# Patient Record
Sex: Male | Born: 1970
Health system: Southern US, Community
[De-identification: ages and names within clinical notes are randomized; demographics above are authoritative.]

## PROBLEM LIST (undated history)

## (undated) DIAGNOSIS — K7030 Alcoholic cirrhosis of liver without ascites: Secondary | ICD-10-CM

## (undated) DIAGNOSIS — E039 Hypothyroidism, unspecified: Secondary | ICD-10-CM

## (undated) DIAGNOSIS — I10 Essential (primary) hypertension: Secondary | ICD-10-CM

## (undated) DIAGNOSIS — K219 Gastro-esophageal reflux disease without esophagitis: Secondary | ICD-10-CM

## (undated) DIAGNOSIS — E785 Hyperlipidemia, unspecified: Secondary | ICD-10-CM

## (undated) DIAGNOSIS — D649 Anemia, unspecified: Secondary | ICD-10-CM

## (undated) DIAGNOSIS — Z5189 Encounter for other specified aftercare: Secondary | ICD-10-CM

---

## 2016-07-08 DIAGNOSIS — S0501XA Injury of conjunctiva and corneal abrasion without foreign body, right eye, initial encounter: Secondary | ICD-10-CM | POA: Diagnosis not present

## 2016-07-22 DIAGNOSIS — Z6830 Body mass index (BMI) 30.0-30.9, adult: Secondary | ICD-10-CM | POA: Diagnosis not present

## 2016-07-22 DIAGNOSIS — E785 Hyperlipidemia, unspecified: Secondary | ICD-10-CM | POA: Diagnosis not present

## 2016-07-22 DIAGNOSIS — I10 Essential (primary) hypertension: Secondary | ICD-10-CM | POA: Diagnosis not present

## 2016-07-22 DIAGNOSIS — E039 Hypothyroidism, unspecified: Secondary | ICD-10-CM | POA: Diagnosis not present

## 2017-04-18 DIAGNOSIS — M9902 Segmental and somatic dysfunction of thoracic region: Secondary | ICD-10-CM | POA: Diagnosis not present

## 2017-04-18 DIAGNOSIS — M9903 Segmental and somatic dysfunction of lumbar region: Secondary | ICD-10-CM | POA: Diagnosis not present

## 2017-04-18 DIAGNOSIS — M545 Low back pain: Secondary | ICD-10-CM | POA: Diagnosis not present

## 2017-04-18 DIAGNOSIS — M9905 Segmental and somatic dysfunction of pelvic region: Secondary | ICD-10-CM | POA: Diagnosis not present

## 2017-04-20 DIAGNOSIS — M9902 Segmental and somatic dysfunction of thoracic region: Secondary | ICD-10-CM | POA: Diagnosis not present

## 2017-04-20 DIAGNOSIS — M9903 Segmental and somatic dysfunction of lumbar region: Secondary | ICD-10-CM | POA: Diagnosis not present

## 2017-04-20 DIAGNOSIS — M9905 Segmental and somatic dysfunction of pelvic region: Secondary | ICD-10-CM | POA: Diagnosis not present

## 2017-04-20 DIAGNOSIS — M545 Low back pain: Secondary | ICD-10-CM | POA: Diagnosis not present

## 2017-04-22 DIAGNOSIS — M545 Low back pain: Secondary | ICD-10-CM | POA: Diagnosis not present

## 2017-04-22 DIAGNOSIS — M9905 Segmental and somatic dysfunction of pelvic region: Secondary | ICD-10-CM | POA: Diagnosis not present

## 2017-04-22 DIAGNOSIS — M9902 Segmental and somatic dysfunction of thoracic region: Secondary | ICD-10-CM | POA: Diagnosis not present

## 2017-04-22 DIAGNOSIS — M9903 Segmental and somatic dysfunction of lumbar region: Secondary | ICD-10-CM | POA: Diagnosis not present

## 2017-05-23 DIAGNOSIS — J439 Emphysema, unspecified: Secondary | ICD-10-CM | POA: Diagnosis not present

## 2017-05-31 DIAGNOSIS — M9905 Segmental and somatic dysfunction of pelvic region: Secondary | ICD-10-CM | POA: Diagnosis not present

## 2017-05-31 DIAGNOSIS — M9903 Segmental and somatic dysfunction of lumbar region: Secondary | ICD-10-CM | POA: Diagnosis not present

## 2017-05-31 DIAGNOSIS — M545 Low back pain: Secondary | ICD-10-CM | POA: Diagnosis not present

## 2017-05-31 DIAGNOSIS — M9902 Segmental and somatic dysfunction of thoracic region: Secondary | ICD-10-CM | POA: Diagnosis not present

## 2017-06-09 DIAGNOSIS — M9905 Segmental and somatic dysfunction of pelvic region: Secondary | ICD-10-CM | POA: Diagnosis not present

## 2017-06-09 DIAGNOSIS — M545 Low back pain: Secondary | ICD-10-CM | POA: Diagnosis not present

## 2017-06-09 DIAGNOSIS — M9902 Segmental and somatic dysfunction of thoracic region: Secondary | ICD-10-CM | POA: Diagnosis not present

## 2017-06-09 DIAGNOSIS — M9903 Segmental and somatic dysfunction of lumbar region: Secondary | ICD-10-CM | POA: Diagnosis not present

## 2017-06-16 DIAGNOSIS — M9902 Segmental and somatic dysfunction of thoracic region: Secondary | ICD-10-CM | POA: Diagnosis not present

## 2017-06-16 DIAGNOSIS — M9903 Segmental and somatic dysfunction of lumbar region: Secondary | ICD-10-CM | POA: Diagnosis not present

## 2017-06-16 DIAGNOSIS — M545 Low back pain: Secondary | ICD-10-CM | POA: Diagnosis not present

## 2017-06-16 DIAGNOSIS — M9905 Segmental and somatic dysfunction of pelvic region: Secondary | ICD-10-CM | POA: Diagnosis not present

## 2017-06-23 DIAGNOSIS — M9905 Segmental and somatic dysfunction of pelvic region: Secondary | ICD-10-CM | POA: Diagnosis not present

## 2017-06-23 DIAGNOSIS — M9903 Segmental and somatic dysfunction of lumbar region: Secondary | ICD-10-CM | POA: Diagnosis not present

## 2017-06-23 DIAGNOSIS — M9902 Segmental and somatic dysfunction of thoracic region: Secondary | ICD-10-CM | POA: Diagnosis not present

## 2017-06-23 DIAGNOSIS — M545 Low back pain: Secondary | ICD-10-CM | POA: Diagnosis not present

## 2017-06-30 DIAGNOSIS — M545 Low back pain: Secondary | ICD-10-CM | POA: Diagnosis not present

## 2017-06-30 DIAGNOSIS — M9905 Segmental and somatic dysfunction of pelvic region: Secondary | ICD-10-CM | POA: Diagnosis not present

## 2017-06-30 DIAGNOSIS — M9902 Segmental and somatic dysfunction of thoracic region: Secondary | ICD-10-CM | POA: Diagnosis not present

## 2017-06-30 DIAGNOSIS — M9903 Segmental and somatic dysfunction of lumbar region: Secondary | ICD-10-CM | POA: Diagnosis not present

## 2017-07-14 DIAGNOSIS — M9903 Segmental and somatic dysfunction of lumbar region: Secondary | ICD-10-CM | POA: Diagnosis not present

## 2017-07-14 DIAGNOSIS — M9902 Segmental and somatic dysfunction of thoracic region: Secondary | ICD-10-CM | POA: Diagnosis not present

## 2017-07-14 DIAGNOSIS — M545 Low back pain: Secondary | ICD-10-CM | POA: Diagnosis not present

## 2017-07-14 DIAGNOSIS — M9905 Segmental and somatic dysfunction of pelvic region: Secondary | ICD-10-CM | POA: Diagnosis not present

## 2017-07-21 DIAGNOSIS — M545 Low back pain: Secondary | ICD-10-CM | POA: Diagnosis not present

## 2017-07-21 DIAGNOSIS — M9902 Segmental and somatic dysfunction of thoracic region: Secondary | ICD-10-CM | POA: Diagnosis not present

## 2017-07-21 DIAGNOSIS — M9905 Segmental and somatic dysfunction of pelvic region: Secondary | ICD-10-CM | POA: Diagnosis not present

## 2017-07-21 DIAGNOSIS — M9903 Segmental and somatic dysfunction of lumbar region: Secondary | ICD-10-CM | POA: Diagnosis not present

## 2017-11-29 DIAGNOSIS — E039 Hypothyroidism, unspecified: Secondary | ICD-10-CM | POA: Diagnosis not present

## 2017-11-29 DIAGNOSIS — E785 Hyperlipidemia, unspecified: Secondary | ICD-10-CM | POA: Diagnosis not present

## 2017-11-29 DIAGNOSIS — I10 Essential (primary) hypertension: Secondary | ICD-10-CM | POA: Diagnosis not present

## 2017-11-29 DIAGNOSIS — K219 Gastro-esophageal reflux disease without esophagitis: Secondary | ICD-10-CM | POA: Diagnosis not present

## 2017-11-29 DIAGNOSIS — Z1331 Encounter for screening for depression: Secondary | ICD-10-CM | POA: Diagnosis not present

## 2017-12-01 DIAGNOSIS — M87051 Idiopathic aseptic necrosis of right femur: Secondary | ICD-10-CM | POA: Diagnosis not present

## 2017-12-01 DIAGNOSIS — M545 Low back pain: Secondary | ICD-10-CM | POA: Diagnosis not present

## 2017-12-01 DIAGNOSIS — M25551 Pain in right hip: Secondary | ICD-10-CM | POA: Diagnosis not present

## 2018-01-17 DIAGNOSIS — R740 Nonspecific elevation of levels of transaminase and lactic acid dehydrogenase [LDH]: Secondary | ICD-10-CM | POA: Diagnosis not present

## 2018-01-17 DIAGNOSIS — E785 Hyperlipidemia, unspecified: Secondary | ICD-10-CM | POA: Diagnosis not present

## 2018-01-17 DIAGNOSIS — Z01818 Encounter for other preprocedural examination: Secondary | ICD-10-CM | POA: Diagnosis not present

## 2018-01-17 DIAGNOSIS — I10 Essential (primary) hypertension: Secondary | ICD-10-CM | POA: Diagnosis not present

## 2018-01-17 DIAGNOSIS — M25551 Pain in right hip: Secondary | ICD-10-CM | POA: Diagnosis not present

## 2018-01-25 DIAGNOSIS — M1611 Unilateral primary osteoarthritis, right hip: Secondary | ICD-10-CM | POA: Diagnosis not present

## 2018-01-30 DIAGNOSIS — Z96651 Presence of right artificial knee joint: Secondary | ICD-10-CM | POA: Diagnosis not present

## 2018-01-30 DIAGNOSIS — M87051 Idiopathic aseptic necrosis of right femur: Secondary | ICD-10-CM | POA: Diagnosis not present

## 2018-01-30 DIAGNOSIS — M1611 Unilateral primary osteoarthritis, right hip: Secondary | ICD-10-CM | POA: Diagnosis not present

## 2018-03-16 DIAGNOSIS — Z471 Aftercare following joint replacement surgery: Secondary | ICD-10-CM | POA: Diagnosis not present

## 2018-03-16 DIAGNOSIS — Z96641 Presence of right artificial hip joint: Secondary | ICD-10-CM | POA: Diagnosis not present

## 2018-05-03 DIAGNOSIS — M25552 Pain in left hip: Secondary | ICD-10-CM | POA: Diagnosis not present

## 2018-05-03 DIAGNOSIS — M87052 Idiopathic aseptic necrosis of left femur: Secondary | ICD-10-CM | POA: Diagnosis not present

## 2018-05-03 DIAGNOSIS — M25551 Pain in right hip: Secondary | ICD-10-CM | POA: Diagnosis not present

## 2018-06-15 DIAGNOSIS — M25552 Pain in left hip: Secondary | ICD-10-CM | POA: Diagnosis not present

## 2018-06-29 DIAGNOSIS — M25552 Pain in left hip: Secondary | ICD-10-CM | POA: Diagnosis not present

## 2018-07-03 DIAGNOSIS — M1612 Unilateral primary osteoarthritis, left hip: Secondary | ICD-10-CM | POA: Diagnosis not present

## 2018-07-03 DIAGNOSIS — M87352 Other secondary osteonecrosis, left femur: Secondary | ICD-10-CM | POA: Diagnosis not present

## 2019-08-16 DIAGNOSIS — E039 Hypothyroidism, unspecified: Secondary | ICD-10-CM | POA: Diagnosis not present

## 2019-08-16 DIAGNOSIS — Z1331 Encounter for screening for depression: Secondary | ICD-10-CM | POA: Diagnosis not present

## 2019-08-16 DIAGNOSIS — I10 Essential (primary) hypertension: Secondary | ICD-10-CM | POA: Diagnosis not present

## 2019-08-16 DIAGNOSIS — K219 Gastro-esophageal reflux disease without esophagitis: Secondary | ICD-10-CM | POA: Diagnosis not present

## 2019-08-16 DIAGNOSIS — E785 Hyperlipidemia, unspecified: Secondary | ICD-10-CM | POA: Diagnosis not present

## 2019-08-21 DIAGNOSIS — R7401 Elevation of levels of liver transaminase levels: Secondary | ICD-10-CM | POA: Diagnosis not present

## 2019-09-06 DIAGNOSIS — R7401 Elevation of levels of liver transaminase levels: Secondary | ICD-10-CM | POA: Diagnosis not present

## 2020-03-20 DIAGNOSIS — K219 Gastro-esophageal reflux disease without esophagitis: Secondary | ICD-10-CM | POA: Diagnosis not present

## 2020-03-20 DIAGNOSIS — E785 Hyperlipidemia, unspecified: Secondary | ICD-10-CM | POA: Diagnosis not present

## 2020-03-20 DIAGNOSIS — E039 Hypothyroidism, unspecified: Secondary | ICD-10-CM | POA: Diagnosis not present

## 2020-03-20 DIAGNOSIS — I10 Essential (primary) hypertension: Secondary | ICD-10-CM | POA: Diagnosis not present

## 2020-05-14 DIAGNOSIS — I8510 Secondary esophageal varices without bleeding: Secondary | ICD-10-CM | POA: Diagnosis not present

## 2020-05-14 DIAGNOSIS — K920 Hematemesis: Secondary | ICD-10-CM | POA: Diagnosis not present

## 2020-05-14 DIAGNOSIS — K7031 Alcoholic cirrhosis of liver with ascites: Secondary | ICD-10-CM | POA: Diagnosis not present

## 2020-05-14 DIAGNOSIS — E876 Hypokalemia: Secondary | ICD-10-CM | POA: Diagnosis not present

## 2020-05-14 DIAGNOSIS — K56609 Unspecified intestinal obstruction, unspecified as to partial versus complete obstruction: Secondary | ICD-10-CM | POA: Diagnosis not present

## 2020-05-14 DIAGNOSIS — R112 Nausea with vomiting, unspecified: Secondary | ICD-10-CM | POA: Diagnosis not present

## 2020-05-14 DIAGNOSIS — E860 Dehydration: Secondary | ICD-10-CM | POA: Diagnosis not present

## 2020-05-14 DIAGNOSIS — I700 Atherosclerosis of aorta: Secondary | ICD-10-CM | POA: Diagnosis not present

## 2020-05-14 DIAGNOSIS — R1110 Vomiting, unspecified: Secondary | ICD-10-CM | POA: Diagnosis not present

## 2020-05-14 DIAGNOSIS — K7460 Unspecified cirrhosis of liver: Secondary | ICD-10-CM | POA: Diagnosis not present

## 2020-05-14 DIAGNOSIS — R110 Nausea: Secondary | ICD-10-CM | POA: Diagnosis not present

## 2020-05-15 ENCOUNTER — Telehealth (HOSPITAL_COMMUNITY): Payer: Self-pay | Admitting: *Deleted

## 2020-05-15 ENCOUNTER — Inpatient Hospital Stay (HOSPITAL_COMMUNITY): Payer: BC Managed Care – PPO | Admitting: Anesthesiology

## 2020-05-15 ENCOUNTER — Inpatient Hospital Stay (HOSPITAL_COMMUNITY): Payer: BC Managed Care – PPO

## 2020-05-15 ENCOUNTER — Inpatient Hospital Stay (HOSPITAL_COMMUNITY)
Admission: AD | Admit: 2020-05-15 | Discharge: 2020-05-26 | DRG: 405 | Disposition: A | Discharge status: Rehabilitation Facility | Payer: BC Managed Care – PPO | Source: Other Acute Inpatient Hospital | Attending: Internal Medicine | Admitting: Internal Medicine

## 2020-05-15 ENCOUNTER — Inpatient Hospital Stay
Admission: AD | Admit: 2020-05-15 | Payer: Self-pay | Source: Other Acute Inpatient Hospital | Admitting: Pulmonary Disease

## 2020-05-15 ENCOUNTER — Encounter (HOSPITAL_COMMUNITY)
Admission: AD | Disposition: A | Discharge status: Rehabilitation Facility | Payer: Self-pay | Source: Other Acute Inpatient Hospital | Attending: Internal Medicine

## 2020-05-15 DIAGNOSIS — Z20822 Contact with and (suspected) exposure to covid-19: Secondary | ICD-10-CM | POA: Diagnosis present

## 2020-05-15 DIAGNOSIS — R5381 Other malaise: Secondary | ICD-10-CM | POA: Diagnosis present

## 2020-05-15 DIAGNOSIS — K920 Hematemesis: Secondary | ICD-10-CM | POA: Diagnosis not present

## 2020-05-15 DIAGNOSIS — Z720 Tobacco use: Secondary | ICD-10-CM | POA: Diagnosis not present

## 2020-05-15 DIAGNOSIS — K766 Portal hypertension: Secondary | ICD-10-CM | POA: Diagnosis present

## 2020-05-15 DIAGNOSIS — Z978 Presence of other specified devices: Secondary | ICD-10-CM

## 2020-05-15 DIAGNOSIS — E876 Hypokalemia: Secondary | ICD-10-CM | POA: Diagnosis present

## 2020-05-15 DIAGNOSIS — I959 Hypotension, unspecified: Secondary | ICD-10-CM | POA: Diagnosis not present

## 2020-05-15 DIAGNOSIS — K439 Ventral hernia without obstruction or gangrene: Secondary | ICD-10-CM | POA: Diagnosis present

## 2020-05-15 DIAGNOSIS — D689 Coagulation defect, unspecified: Secondary | ICD-10-CM | POA: Diagnosis present

## 2020-05-15 DIAGNOSIS — G9341 Metabolic encephalopathy: Secondary | ICD-10-CM | POA: Diagnosis not present

## 2020-05-15 DIAGNOSIS — N170 Acute kidney failure with tubular necrosis: Secondary | ICD-10-CM | POA: Diagnosis not present

## 2020-05-15 DIAGNOSIS — K922 Gastrointestinal hemorrhage, unspecified: Secondary | ICD-10-CM | POA: Diagnosis not present

## 2020-05-15 DIAGNOSIS — I8500 Esophageal varices without bleeding: Secondary | ICD-10-CM | POA: Diagnosis not present

## 2020-05-15 DIAGNOSIS — R4189 Other symptoms and signs involving cognitive functions and awareness: Secondary | ICD-10-CM | POA: Diagnosis not present

## 2020-05-15 DIAGNOSIS — R339 Retention of urine, unspecified: Secondary | ICD-10-CM | POA: Diagnosis not present

## 2020-05-15 DIAGNOSIS — Z9181 History of falling: Secondary | ICD-10-CM | POA: Diagnosis not present

## 2020-05-15 DIAGNOSIS — K7689 Other specified diseases of liver: Secondary | ICD-10-CM | POA: Diagnosis not present

## 2020-05-15 DIAGNOSIS — Z4682 Encounter for fitting and adjustment of non-vascular catheter: Secondary | ICD-10-CM | POA: Diagnosis not present

## 2020-05-15 DIAGNOSIS — K56609 Unspecified intestinal obstruction, unspecified as to partial versus complete obstruction: Secondary | ICD-10-CM

## 2020-05-15 DIAGNOSIS — K7010 Alcoholic hepatitis without ascites: Secondary | ICD-10-CM | POA: Diagnosis present

## 2020-05-15 DIAGNOSIS — K7201 Acute and subacute hepatic failure with coma: Secondary | ICD-10-CM | POA: Diagnosis not present

## 2020-05-15 DIAGNOSIS — Z9689 Presence of other specified functional implants: Secondary | ICD-10-CM | POA: Diagnosis not present

## 2020-05-15 DIAGNOSIS — Z452 Encounter for adjustment and management of vascular access device: Secondary | ICD-10-CM | POA: Diagnosis not present

## 2020-05-15 DIAGNOSIS — J9811 Atelectasis: Secondary | ICD-10-CM | POA: Diagnosis not present

## 2020-05-15 DIAGNOSIS — K7040 Alcoholic hepatic failure without coma: Secondary | ICD-10-CM | POA: Diagnosis present

## 2020-05-15 DIAGNOSIS — K6389 Other specified diseases of intestine: Secondary | ICD-10-CM | POA: Diagnosis not present

## 2020-05-15 DIAGNOSIS — D62 Acute posthemorrhagic anemia: Secondary | ICD-10-CM | POA: Diagnosis present

## 2020-05-15 DIAGNOSIS — K7031 Alcoholic cirrhosis of liver with ascites: Secondary | ICD-10-CM | POA: Diagnosis not present

## 2020-05-15 DIAGNOSIS — F17220 Nicotine dependence, chewing tobacco, uncomplicated: Secondary | ICD-10-CM | POA: Diagnosis present

## 2020-05-15 DIAGNOSIS — Z9889 Other specified postprocedural states: Secondary | ICD-10-CM | POA: Diagnosis not present

## 2020-05-15 DIAGNOSIS — I8511 Secondary esophageal varices with bleeding: Secondary | ICD-10-CM | POA: Diagnosis not present

## 2020-05-15 DIAGNOSIS — D72829 Elevated white blood cell count, unspecified: Secondary | ICD-10-CM | POA: Diagnosis not present

## 2020-05-15 DIAGNOSIS — K3189 Other diseases of stomach and duodenum: Secondary | ICD-10-CM

## 2020-05-15 DIAGNOSIS — N179 Acute kidney failure, unspecified: Secondary | ICD-10-CM | POA: Diagnosis present

## 2020-05-15 DIAGNOSIS — R578 Other shock: Secondary | ICD-10-CM | POA: Diagnosis present

## 2020-05-15 DIAGNOSIS — I10 Essential (primary) hypertension: Secondary | ICD-10-CM | POA: Diagnosis not present

## 2020-05-15 DIAGNOSIS — R140 Abdominal distension (gaseous): Secondary | ICD-10-CM

## 2020-05-15 DIAGNOSIS — Z8249 Family history of ischemic heart disease and other diseases of the circulatory system: Secondary | ICD-10-CM

## 2020-05-15 DIAGNOSIS — I8510 Secondary esophageal varices without bleeding: Secondary | ICD-10-CM | POA: Diagnosis not present

## 2020-05-15 DIAGNOSIS — J9601 Acute respiratory failure with hypoxia: Secondary | ICD-10-CM | POA: Diagnosis present

## 2020-05-15 DIAGNOSIS — Z79890 Hormone replacement therapy (postmenopausal): Secondary | ICD-10-CM | POA: Diagnosis not present

## 2020-05-15 DIAGNOSIS — R188 Other ascites: Secondary | ICD-10-CM

## 2020-05-15 DIAGNOSIS — E872 Acidosis: Secondary | ICD-10-CM | POA: Diagnosis present

## 2020-05-15 DIAGNOSIS — K7030 Alcoholic cirrhosis of liver without ascites: Principal | ICD-10-CM | POA: Diagnosis present

## 2020-05-15 DIAGNOSIS — F1010 Alcohol abuse, uncomplicated: Secondary | ICD-10-CM | POA: Diagnosis present

## 2020-05-15 DIAGNOSIS — R451 Restlessness and agitation: Secondary | ICD-10-CM | POA: Diagnosis not present

## 2020-05-15 DIAGNOSIS — Z23 Encounter for immunization: Secondary | ICD-10-CM | POA: Diagnosis not present

## 2020-05-15 DIAGNOSIS — K420 Umbilical hernia with obstruction, without gangrene: Secondary | ICD-10-CM | POA: Diagnosis present

## 2020-05-15 DIAGNOSIS — K7460 Unspecified cirrhosis of liver: Secondary | ICD-10-CM | POA: Diagnosis not present

## 2020-05-15 DIAGNOSIS — K56699 Other intestinal obstruction unspecified as to partial versus complete obstruction: Secondary | ICD-10-CM | POA: Diagnosis not present

## 2020-05-15 DIAGNOSIS — K7290 Hepatic failure, unspecified without coma: Secondary | ICD-10-CM | POA: Diagnosis not present

## 2020-05-15 DIAGNOSIS — Z95828 Presence of other vascular implants and grafts: Secondary | ICD-10-CM | POA: Diagnosis not present

## 2020-05-15 DIAGNOSIS — E039 Hypothyroidism, unspecified: Secondary | ICD-10-CM | POA: Diagnosis not present

## 2020-05-15 DIAGNOSIS — R600 Localized edema: Secondary | ICD-10-CM | POA: Diagnosis present

## 2020-05-15 DIAGNOSIS — R21 Rash and other nonspecific skin eruption: Secondary | ICD-10-CM | POA: Diagnosis not present

## 2020-05-15 DIAGNOSIS — Z79899 Other long term (current) drug therapy: Secondary | ICD-10-CM | POA: Diagnosis not present

## 2020-05-15 DIAGNOSIS — I8501 Esophageal varices with bleeding: Secondary | ICD-10-CM | POA: Diagnosis not present

## 2020-05-15 HISTORY — PX: RADIOLOGY WITH ANESTHESIA: SHX6223

## 2020-05-15 HISTORY — DX: Essential (primary) hypertension: I10

## 2020-05-15 HISTORY — DX: Hypothyroidism, unspecified: E03.9

## 2020-05-15 LAB — PREPARE RBC (CROSSMATCH)

## 2020-05-15 LAB — ABO/RH: ABO/RH(D): O POS

## 2020-05-15 SURGERY — IR WITH ANESTHESIA
Anesthesia: General

## 2020-05-15 MED ORDER — FENTANYL BOLUS VIA INFUSION
50.0000 ug | INTRAVENOUS | Status: DC | PRN
  Administered 2020-05-15: 100 ug via INTRAVENOUS
  Administered 2020-05-15: 50 ug via INTRAVENOUS

## 2020-05-15 MED ORDER — NOREPINEPHRINE 4 MG/250ML-% IV SOLN
2.0000 ug/min | INTRAVENOUS | Status: DC
  Administered 2020-05-15: 10 ug/min via INTRAVENOUS

## 2020-05-15 MED ORDER — ROCURONIUM BROMIDE 100 MG/10ML IV SOLN: INTRAVENOUS | Status: DC | PRN

## 2020-05-15 MED ORDER — CALCIUM CHLORIDE 10 % IV SOLN
INTRAVENOUS | Status: AC
  Filled 2020-05-15: qty 10

## 2020-05-15 MED ORDER — PROPOFOL 10 MG/ML IV BOLUS
INTRAVENOUS | Status: DC | PRN
  Administered 2020-05-15: 40 mg via INTRAVENOUS

## 2020-05-15 MED ORDER — IOHEXOL 300 MG/ML  SOLN: 150.0000 mL | Freq: Once | INTRAMUSCULAR | Status: DC | PRN

## 2020-05-15 MED ORDER — NOREPINEPHRINE 4 MG/250ML-% IV SOLN: 0.0000 ug/min | INTRAVENOUS | Status: DC

## 2020-05-15 MED ORDER — FENTANYL 2500MCG IN NS 250ML (10MCG/ML) PREMIX INFUSION
50.0000 ug/h | INTRAVENOUS | Status: DC
  Administered 2020-05-15: 400 ug/h via INTRAVENOUS

## 2020-05-15 MED ORDER — FENTANYL 2500MCG IN NS 250ML (10MCG/ML) PREMIX INFUSION
50.0000 ug/h | INTRAVENOUS | Status: DC
  Administered 2020-05-15: 150 ug/h via INTRAVENOUS
  Administered 2020-05-16: 200 ug/h via INTRAVENOUS
  Administered 2020-05-17 – 2020-05-18 (×2): 150 ug/h via INTRAVENOUS
  Administered 2020-05-19: 200 ug/h via INTRAVENOUS
  Filled 2020-05-15 (×6): qty 250

## 2020-05-15 MED ORDER — FENTANYL CITRATE (PF) 100 MCG/2ML IJ SOLN: 100.0000 ug | Freq: Once | INTRAMUSCULAR | Status: DC

## 2020-05-15 MED ORDER — MIDAZOLAM HCL 2 MG/2ML IJ SOLN: 2.0000 mg | INTRAMUSCULAR | Status: DC | PRN

## 2020-05-15 MED ORDER — SODIUM CHLORIDE 0.9 % IV SOLN: 1.0000 mg | Freq: Once | INTRAVENOUS | Status: DC

## 2020-05-15 MED ORDER — SODIUM CHLORIDE 0.9 % IV SOLN
INTRAVENOUS | Status: DC | PRN
  Administered 2020-05-15: 2 mg/h via INTRAVENOUS

## 2020-05-15 MED ORDER — PANTOPRAZOLE SODIUM 40 MG IV SOLR: 40.0000 mg | Freq: Two times a day (BID) | INTRAVENOUS | Status: DC

## 2020-05-15 MED ORDER — DOCUSATE SODIUM 100 MG PO CAPS: 100.0000 mg | ORAL_CAPSULE | Freq: Two times a day (BID) | ORAL | Status: DC | PRN

## 2020-05-15 MED ORDER — IOHEXOL 300 MG/ML  SOLN: 100.0000 mL | Freq: Once | INTRAMUSCULAR | Status: DC | PRN

## 2020-05-15 MED ORDER — SODIUM CHLORIDE 0.9 % IV SOLN
8.0000 mg/h | INTRAVENOUS | Status: DC
  Administered 2020-05-16 – 2020-05-18 (×3): 8 mg/h via INTRAVENOUS
  Filled 2020-05-15 (×4): qty 80

## 2020-05-15 MED ORDER — DOCUSATE SODIUM 50 MG/5ML PO LIQD: 100.0000 mg | Freq: Two times a day (BID) | ORAL | Status: DC

## 2020-05-15 MED ORDER — ONDANSETRON HCL 4 MG/2ML IJ SOLN: 4.0000 mg | Freq: Four times a day (QID) | INTRAMUSCULAR | Status: DC | PRN

## 2020-05-15 MED ORDER — FOLIC ACID 5 MG/ML IJ SOLN
1.0000 mg | Freq: Once | INTRAMUSCULAR | Status: AC
  Administered 2020-05-16: 1 mg via INTRAVENOUS
  Filled 2020-05-15: qty 0.2

## 2020-05-15 MED ORDER — ROCURONIUM BROMIDE 10 MG/ML (PF) SYRINGE
PREFILLED_SYRINGE | INTRAVENOUS | Status: DC | PRN
  Administered 2020-05-15 (×5): 50 mg via INTRAVENOUS

## 2020-05-15 MED ORDER — POLYETHYLENE GLYCOL 3350 17 G PO PACK: 17.0000 g | PACK | Freq: Every day | ORAL | Status: DC | PRN

## 2020-05-15 MED ORDER — GELATIN ABSORBABLE 12-7 MM EX MISC
CUTANEOUS | Status: AC
  Filled 2020-05-15: qty 1

## 2020-05-15 MED ORDER — SODIUM CHLORIDE 0.9 % IV SOLN
250.0000 mL | INTRAVENOUS | Status: DC
  Administered 2020-05-16: 250 mL via INTRAVENOUS

## 2020-05-15 MED ORDER — CALCIUM CHLORIDE 10 % IV SOLN
INTRAVENOUS | Status: DC | PRN
  Administered 2020-05-15: 300 mg via INTRAVENOUS
  Administered 2020-05-15 (×2): 200 mg via INTRAVENOUS
  Administered 2020-05-15: 300 mg via INTRAVENOUS

## 2020-05-15 MED ORDER — FENTANYL CITRATE (PF) 100 MCG/2ML IJ SOLN: 50.0000 ug | Freq: Once | INTRAMUSCULAR | Status: DC

## 2020-05-15 MED ORDER — ALBUMIN HUMAN 5 % IV SOLN: INTRAVENOUS | Status: DC | PRN

## 2020-05-15 MED ORDER — SODIUM CHLORIDE 0.9 % IV SOLN
250.0000 mL | INTRAVENOUS | Status: DC
  Administered 2020-05-17 – 2020-05-20 (×2): 250 mL via INTRAVENOUS

## 2020-05-15 MED ORDER — SODIUM CHLORIDE 0.9 % IV SOLN
50.0000 ug/h | INTRAVENOUS | Status: DC
  Administered 2020-05-15 – 2020-05-18 (×5): 50 ug/h via INTRAVENOUS
  Filled 2020-05-15 (×6): qty 1

## 2020-05-15 MED ORDER — LACTATED RINGERS IV SOLN: INTRAVENOUS | Status: DC

## 2020-05-15 MED ORDER — DEXMEDETOMIDINE HCL IN NACL 400 MCG/100ML IV SOLN: 0.4000 ug/kg/h | INTRAVENOUS | Status: DC

## 2020-05-15 MED ORDER — POLYETHYLENE GLYCOL 3350 17 G PO PACK: 17.0000 g | PACK | Freq: Every day | ORAL | Status: DC

## 2020-05-15 MED ORDER — THIAMINE HCL 100 MG/ML IJ SOLN
100.0000 mg | Freq: Every day | INTRAMUSCULAR | Status: DC
  Administered 2020-05-16 – 2020-05-20 (×5): 100 mg via INTRAVENOUS
  Filled 2020-05-15 (×5): qty 2

## 2020-05-15 MED ORDER — PIPERACILLIN-TAZOBACTAM 3.375 G IVPB
3.3750 g | Freq: Three times a day (TID) | INTRAVENOUS | Status: DC
  Administered 2020-05-15 – 2020-05-18 (×9): 3.375 g via INTRAVENOUS
  Filled 2020-05-15 (×9): qty 50

## 2020-05-15 MED ORDER — CEFAZOLIN SODIUM-DEXTROSE 2-4 GM/100ML-% IV SOLN
INTRAVENOUS | Status: AC
  Filled 2020-05-15: qty 100

## 2020-05-15 MED ORDER — SODIUM CHLORIDE 0.9 % IV SOLN
80.0000 mg | Freq: Once | INTRAVENOUS | Status: AC
  Administered 2020-05-16: 80 mg via INTRAVENOUS
  Filled 2020-05-15: qty 80

## 2020-05-15 MED ORDER — FENTANYL BOLUS VIA INFUSION
50.0000 ug | INTRAVENOUS | Status: DC | PRN
  Filled 2020-05-15: qty 50

## 2020-05-15 MED ORDER — CEFAZOLIN SODIUM-DEXTROSE 2-3 GM-%(50ML) IV SOLR
INTRAVENOUS | Status: DC | PRN
  Administered 2020-05-15: 2 g via INTRAVENOUS

## 2020-05-15 NOTE — Procedures (Signed)
Interventional Radiology Procedure Note  Procedure: TIPS and embolization of varices  Complications: None  Estimated Blood Loss: 10 mL  Findings: Successful placement of TIPS with 10 mm diameter Viatorr prosthesis. Additional coil and occluder device embolization of variceal trunks supplying esophageal varices. Will follow post TIPS.  Venetia Night. Kathlene Cote, M.D Pager:  [phone]

## 2020-05-15 NOTE — Sedation Documentation (Signed)
Patient arrives via Insurance underwriter with [employer]. Anesthesia to assume care and monitoring.

## 2020-05-15 NOTE — Anesthesia Procedure Notes (Signed)
Arterial Line Insertion Start/End8/19/2021 6:05 PM, 05/15/2020 6:15 PM Performed by: Catalina Gravel, MD, anesthesiologist  Patient location: OR. Preanesthetic checklist: patient identified, IV checked, site marked, risks and benefits discussed, surgical consent, monitors and equipment checked, pre-op evaluation, timeout performed and anesthesia consent Lidocaine 1% used for infiltration Left, radial was placed Catheter size: 20 Fr Hand hygiene performed  and maximum sterile barriers used   Attempts: 1 Procedure performed without using ultrasound guided technique. Following insertion, dressing applied and Biopatch. Post procedure assessment: normal and unchanged  Patient tolerated the procedure well with no immediate complications.

## 2020-05-15 NOTE — Progress Notes (Addendum)
Pharmacy Antibiotic Note  Jimmy Gonzales is a 49 y.o. male admitted on 05/15/2020 on transfer from [facility] for emergent TIPS placement after presenting to OSH with hematemesis, upper GI bleed, and hemorrhagic shock due to esophageal variceal bleeding. Pharmacy has been consulted for Zosyn dosing for intraabdominal infection.  Per pharmacist at [facility]: Weight: 102.4 kg Height: 81 inches Scr today 2.2 (up from 1.8 yesterday); Scr ~60 ml/min NKA  Plan: Zosyn 3.375 gm IV Q 8 hrs (extended infusion) Monitor WBC, temp, renal function  CrCl cannot be calculated (No successful lab value found.).    Antimicrobials this admission: Per [facility] pharmacist, pt rec'd ceftriaxone 2 gm IV Q 24 hrs there, starting last evening 8/19 Cefazolin peri op  Microbiology results: N/A  Thank you for allowing pharmacy to be a part of this patient's care.  Gillermina Hu, PharmD, BCPS, [facility] Clinical Pharmacist 05/15/2020 7:04 PM

## 2020-05-15 NOTE — H&P (Signed)
NAME:  Jimmy Gonzales, MRN:  [id number], DOB:  1971-03-09, LOS: 0 ADMISSION DATE:  05/15/2020, CONSULTATION DATE: May 15, 2020 REFERRING MD: Dr. Kathlene Cote, CHIEF COMPLAINT: Vomiting blood  Brief History   49 year old male with a past medical history significant for heavy alcohol abuse presented to [facility] with hematemesis, developed hemorrhagic shock, GI unable to control bleeding with endoscopy.  Transferred to [facility] for emergent TIPS placement.  History of present illness   This is a 49 year old male who has a past medical history significant for drinking 1/2 gallon of hard liquor a day who presented to [facility] with a chief complaint of hematemesis on August 18.  Apparently he had developed abdominal pain and nausea and vomiting which was initially nonbilious nonbloody for 2 days prior to admission.  He was on the way to see his physician on August 18 when he suddenly developed hematemesis.  In the emergency department he was noted to have a large ventral hernia which was incarcerated based on CT scanning.  This was easily reduced and he was admitted to the intensive care unit on an octreotide infusion.  However, hematemesis and hematochezia persisted.  He required intubation, central line placement, vasopressor infusion, multiple blood transfusions, and emergent endoscopy.  Unfortunately endoscopy was incomplete as the patient had significant bleeding.  7 bands were placed but esophageal variceal bleeding was unable to be controlled.  Reportedly Covid negative. He was transferred to [facility] for emergent TIPS. 2 units PRBC given during the procedure. PCCM accepted the patient for ICU admission.   Past Medical History  Alcohol abuse  Significant Hospital Events   August 18 admitted [facility] August 19 emergent endoscopy, banding of variceal lesions in esophagus, transferred to [facility] for emergent TIPS procedure  Consults:    Interventional radiology  Procedures:  August 19 endotracheal tube August 19 right IJ central venous line  Significant Diagnostic Tests:  August 18 CT abdomen > high grade SBO with transition at small umbilical hernia containing a portion on the mid jejunum. Cirrhosis with portal venous hypertension and marked abdominal varices.   Micro Data:  August 19 SARS-CoV-2> negative Hepatitis A IgM ab neg Hepatitis Bs antigen negative Hepatitis B core IGM negative Hepatitis C antibody 0.1   Antimicrobials:  August 19 Zosyn>  Interim history/subjective:  As above  Objective   There were no vitals taken for this visit.        Intake/Output Summary (Last 24 hours) at 05/15/2020 1847 Last data filed at 05/15/2020 1825 Gross per 24 hour  Intake 50 ml  Output --  Net 50 ml   There were no vitals filed for this visit.  Examination: General: middle aged male in no distress on vent HENT: Normocephalic. Dried blood on mouth and chin. Scleral icterus.  Lungs: Clear Cardiovascular: Tachy, regular, no MRG Abdomen: Soft, non-tender. Umbilical hernia reduces easily. Dark red/black OG drainage.  Extremities: No acute deformity Neuro: [facility] Problem list     Assessment & Plan:  Acute upper GI Bleed due to esophageal variceal bleeding - Now s/p emergent TIPS 8/19 - Octreotide infusion - PPI infusion, then protonix 40mg  BID - Zosyn r/o intraabdominal infection.   Decompensated alcoholic cirrhosis with portal hypertension: Acute alcoholic hepatitis.  - Minimize narcotics - Hepatic dosing of medications  Acute respiratory failure with hypoxemia due to inability to protect airway: - Full mechanical vent support - VAP prevention - Daily WUA/SBT  Hemorrhagic shock: improved post TIPS,  now off pressors.  - Monitor hemoglobin, goal greater than 8 g/dL - Repeat CBC now and every 6 hours - Check lactic acid  -Monitor coags  Acute kidney injury: Most likely  prerenal given hemorrhagic shock, at risk of hepatorenal syndrome - Repeat basic metabolic panel now - Monitor urine output  Incarcerated bowel, small bowel obstruction, reduced at [facility] - OGT to LIS - KUB to re-evaluate - serial physical exam  Need for sedation for mechanical ventilation Heavy alcohol abuse, at risk for alcohol withdrawal At risk for hepatic encephalopathy  Precedex infusion per PAD protocol, RASS goal -1 Fentanyl infusion Versed drip wean to off.  Thiamine, folate Check ammonia, may need to start lactulose despite liquid stools. Risk of hepatic encephalopathy increased after TIPS.    Best practice:  Diet: NPO Pain/Anxiety/Delirium protocol (if indicated): As above VAP protocol (if indicated): Yes DVT prophylaxis: SCD GI prophylaxis: SCD Glucose control: Monitor Mobility: Bedrest Code Status: Full Family Communication: Wife updated at bedside Disposition: ICU  Labs   CBC: No results for input(s): WBC, NEUTROABS, HGB, HCT, MCV, PLT in the last 168 hours.  Basic Metabolic Panel: No results for input(s): NA, K, CL, CO2, GLUCOSE, BUN, CREATININE, CALCIUM, MG, PHOS in the last 168 hours. GFR: CrCl cannot be calculated (No successful lab value found.). No results for input(s): PROCALCITON, WBC, LATICACIDVEN in the last 168 hours.  Liver Function Tests: No results for input(s): AST, ALT, ALKPHOS, BILITOT, PROT, ALBUMIN in the last 168 hours. No results for input(s): LIPASE, AMYLASE in the last 168 hours. No results for input(s): AMMONIA in the last 168 hours.  ABG No results found for: PHART, PCO2ART, PO2ART, HCO3, TCO2, ACIDBASEDEF, O2SAT   Coagulation Profile: No results for input(s): INR, PROTIME in the last 168 hours.  Cardiac Enzymes: No results for input(s): CKTOTAL, CKMB, CKMBINDEX, TROPONINI in the last 168 hours.  HbA1C: No results found for: HGBA1C  CBG: No results for input(s): GLUCAP in the last 168 hours.  Review of  Systems:   Patient is encephalopathic and/or intubated. Therefore history has been obtained from chart review.   Past Medical History  He,  has no past medical history on file.   Surgical History     Social History      Family History   His family history is not on file.   Allergies Not on File   Home Medications  Prior to Admission medications   Not on File     Critical care time: 41 minutes     Georgann Housekeeper, AGACNP-BC [place] for personal pager PCCM on call pager [phone]  05/16/2020 12:16 AM

## 2020-05-15 NOTE — Anesthesia Preprocedure Evaluation (Addendum)
Anesthesia Evaluation  Patient identified by MRN, date of birth, ID band Patient unresponsive    Reviewed: Allergy & Precautions, Patient's Chart, lab work & pertinent test results, Unable to perform ROS - Chart review onlyPreop documentation limited or incomplete due to emergent nature of procedure.  History of Anesthesia Complications Negative for: history of anesthetic complications  Airway Mallampati: Intubated       Dental   Pulmonary neg pulmonary ROS,       + intubated    Cardiovascular negative cardio ROS   Rate:Tachycardia     Neuro/Psych negative neurological ROS  negative psych ROS   GI/Hepatic negative GI ROS, (+) Cirrhosis   Esophageal Varices    ,   Endo/Other  negative endocrine ROS  Renal/GU negative Renal ROS     Musculoskeletal negative musculoskeletal ROS (+)   Abdominal   Peds  Hematology negative hematology ROS (+)   Anesthesia Other Findings   Reproductive/Obstetrics                           Anesthesia Physical Anesthesia Plan  ASA: IV and emergent  Anesthesia Plan: General   Post-op Pain Management:    Induction: Intravenous, Rapid sequence and Cricoid pressure planned  PONV Risk Score and Plan: 1 and Treatment may vary due to age or medical condition, Ondansetron and Dexamethasone  Airway Management Planned: Oral ETT  Additional Equipment: Arterial line, CVP and Ultrasound Guidance Line Placement  Intra-op Plan:   Post-operative Plan: Possible Post-op intubation/ventilation  Informed Consent:     Only emergency history available  Plan Discussed with: CRNA and Anesthesiologist  Anesthesia Plan Comments: (Portions of pre-op completed after start of anesthesia due to emergent nature of procedure.)      Anesthesia Quick Evaluation

## 2020-05-15 NOTE — Transfer of Care (Signed)
Immediate Anesthesia Transfer of Care Note  Patient: Jimmy Gonzales  Procedure(s) Performed: TIPS PROCEDURE (N/A )  Patient Location: ICU  Anesthesia Type:General  Level of Consciousness: Patient remains intubated per anesthesia plan  Airway & Oxygen Therapy: Patient remains intubated per anesthesia plan and Patient placed on Ventilator (see vital sign flow sheet for setting)  Post-op Assessment: Report given to RN and Post -op Vital signs reviewed and stable  Post vital signs: Reviewed and stable  Last Vitals:  Vitals Value Taken Time  BP 128/72   Temp    Pulse 114   Resp 15   SpO2 98 % 05/15/20 2328    Last Pain: There were no vitals filed for this visit.    Report to Chrys Racer RN in [facility] ICU per Letitia Libra CRNA/this CRNA; patient transported via 100% FiO2 via Ambu bag, currently on fentanyl, versed, octreotide gtts and zosyn abx infusing per orders, all medication infusing through central access via right subclavian, Lillian RT at bedside, applied to ventilator, PRVC 620 Vt, RR15 60% FIO2, VSS, questions answered, transfer of patient care in safe and stable condition.    Complications: No complications documented.

## 2020-05-15 NOTE — Progress Notes (Signed)
[facility] Progress Note Patient Name: [name] DOB: [birth year] MRN: [id number]   Date of Service  05/15/2020  HPI/Events of Note  49 yo alcoholic with upper GI bleeding due to esophageal varices, not able to be controlled by banding, who was transferred to [facility] where he underwent TIPS procedure earlier this evening.  eICU Interventions  New Patient Evaluation completed, PCCM ground crew requested to see the patient.        Kerry Kass Luvena Wentling 05/15/2020, 11:44 PM

## 2020-05-16 ENCOUNTER — Encounter (HOSPITAL_COMMUNITY): Payer: Self-pay | Admitting: Pulmonary Disease

## 2020-05-16 ENCOUNTER — Inpatient Hospital Stay (HOSPITAL_COMMUNITY): Payer: BC Managed Care – PPO

## 2020-05-16 DIAGNOSIS — K56609 Unspecified intestinal obstruction, unspecified as to partial versus complete obstruction: Secondary | ICD-10-CM

## 2020-05-16 DIAGNOSIS — J9601 Acute respiratory failure with hypoxia: Secondary | ICD-10-CM

## 2020-05-16 DIAGNOSIS — N179 Acute kidney failure, unspecified: Secondary | ICD-10-CM

## 2020-05-16 DIAGNOSIS — K7460 Unspecified cirrhosis of liver: Secondary | ICD-10-CM

## 2020-05-16 DIAGNOSIS — K922 Gastrointestinal hemorrhage, unspecified: Secondary | ICD-10-CM

## 2020-05-16 DIAGNOSIS — R578 Other shock: Secondary | ICD-10-CM

## 2020-05-16 DIAGNOSIS — K7290 Hepatic failure, unspecified without coma: Secondary | ICD-10-CM

## 2020-05-16 HISTORY — PX: IR TIPS: IMG2295

## 2020-05-16 HISTORY — PX: IR ANGIOGRAM SELECTIVE EACH ADDITIONAL VESSEL: IMG667

## 2020-05-16 HISTORY — PX: IR EMBO ART  VEN HEMORR LYMPH EXTRAV  INC GUIDE ROADMAPPING: IMG5450

## 2020-05-16 LAB — POCT I-STAT 7, (LYTES, BLD GAS, ICA,H+H)
Acid-base deficit: 3 mmol/L — ABNORMAL HIGH (ref 0.0–2.0)
Acid-base deficit: 4 mmol/L — ABNORMAL HIGH (ref 0.0–2.0)
Acid-base deficit: 5 mmol/L — ABNORMAL HIGH (ref 0.0–2.0)
Acid-base deficit: 5 mmol/L — ABNORMAL HIGH (ref 0.0–2.0)
Bicarbonate: 20.2 mmol/L (ref 20.0–28.0)
Bicarbonate: 20.5 mmol/L (ref 20.0–28.0)
Bicarbonate: 22.5 mmol/L (ref 20.0–28.0)
Bicarbonate: 23.3 mmol/L (ref 20.0–28.0)
Calcium, Ion: 0.76 mmol/L — CL (ref 1.15–1.40)
Calcium, Ion: 0.85 mmol/L — CL (ref 1.15–1.40)
Calcium, Ion: 0.89 mmol/L — CL (ref 1.15–1.40)
Calcium, Ion: 0.93 mmol/L — ABNORMAL LOW (ref 1.15–1.40)
HCT: 22 % — ABNORMAL LOW (ref 39.0–52.0)
HCT: 22 % — ABNORMAL LOW (ref 39.0–52.0)
HCT: 26 % — ABNORMAL LOW (ref 39.0–52.0)
HCT: 29 % — ABNORMAL LOW (ref 39.0–52.0)
Hemoglobin: 7.5 g/dL — ABNORMAL LOW (ref 13.0–17.0)
Hemoglobin: 7.5 g/dL — ABNORMAL LOW (ref 13.0–17.0)
Hemoglobin: 8.8 g/dL — ABNORMAL LOW (ref 13.0–17.0)
Hemoglobin: 9.9 g/dL — ABNORMAL LOW (ref 13.0–17.0)
O2 Saturation: 100 %
O2 Saturation: 98 %
O2 Saturation: 99 %
O2 Saturation: 99 %
Patient temperature: 37
Patient temperature: 37.5
Patient temperature: 99
Potassium: 2.8 mmol/L — ABNORMAL LOW (ref 3.5–5.1)
Potassium: 3 mmol/L — ABNORMAL LOW (ref 3.5–5.1)
Potassium: 3.3 mmol/L — ABNORMAL LOW (ref 3.5–5.1)
Potassium: 3.3 mmol/L — ABNORMAL LOW (ref 3.5–5.1)
Sodium: 136 mmol/L (ref 135–145)
Sodium: 138 mmol/L (ref 135–145)
Sodium: 139 mmol/L (ref 135–145)
Sodium: 139 mmol/L (ref 135–145)
TCO2: 21 mmol/L — ABNORMAL LOW (ref 22–32)
TCO2: 22 mmol/L (ref 22–32)
TCO2: 24 mmol/L (ref 22–32)
TCO2: 25 mmol/L (ref 22–32)
pCO2 arterial: 38.8 mmHg (ref 32.0–48.0)
pCO2 arterial: 40 mmHg (ref 32.0–48.0)
pCO2 arterial: 45.1 mmHg (ref 32.0–48.0)
pCO2 arterial: 47.4 mmHg (ref 32.0–48.0)
pH, Arterial: 7.299 — ABNORMAL LOW (ref 7.350–7.450)
pH, Arterial: 7.307 — ABNORMAL LOW (ref 7.350–7.450)
pH, Arterial: 7.319 — ABNORMAL LOW (ref 7.350–7.450)
pH, Arterial: 7.326 — ABNORMAL LOW (ref 7.350–7.450)
pO2, Arterial: 112 mmHg — ABNORMAL HIGH (ref 83.0–108.0)
pO2, Arterial: 139 mmHg — ABNORMAL HIGH (ref 83.0–108.0)
pO2, Arterial: 145 mmHg — ABNORMAL HIGH (ref 83.0–108.0)
pO2, Arterial: 188 mmHg — ABNORMAL HIGH (ref 83.0–108.0)

## 2020-05-16 LAB — CBC
HCT: 23.6 % — ABNORMAL LOW (ref 39.0–52.0)
HCT: 25.7 % — ABNORMAL LOW (ref 39.0–52.0)
HCT: 26.9 % — ABNORMAL LOW (ref 39.0–52.0)
HCT: 27.3 % — ABNORMAL LOW (ref 39.0–52.0)
HCT: 27.4 % — ABNORMAL LOW (ref 39.0–52.0)
Hemoglobin: 7.9 g/dL — ABNORMAL LOW (ref 13.0–17.0)
Hemoglobin: 8.9 g/dL — ABNORMAL LOW (ref 13.0–17.0)
Hemoglobin: 8.9 g/dL — ABNORMAL LOW (ref 13.0–17.0)
Hemoglobin: 9 g/dL — ABNORMAL LOW (ref 13.0–17.0)
Hemoglobin: 9.1 g/dL — ABNORMAL LOW (ref 13.0–17.0)
MCH: 29 pg (ref 26.0–34.0)
MCH: 29.8 pg (ref 26.0–34.0)
MCH: 30 pg (ref 26.0–34.0)
MCH: 30.4 pg (ref 26.0–34.0)
MCH: 30.4 pg (ref 26.0–34.0)
MCHC: 32.6 g/dL (ref 30.0–36.0)
MCHC: 33.2 g/dL (ref 30.0–36.0)
MCHC: 33.5 g/dL (ref 30.0–36.0)
MCHC: 33.5 g/dL (ref 30.0–36.0)
MCHC: 34.6 g/dL (ref 30.0–36.0)
MCV: 87.7 fL (ref 80.0–100.0)
MCV: 88.9 fL (ref 80.0–100.0)
MCV: 89.1 fL (ref 80.0–100.0)
MCV: 89.7 fL (ref 80.0–100.0)
MCV: 91.6 fL (ref 80.0–100.0)
Platelets: 140 10*3/uL — ABNORMAL LOW (ref 150–400)
Platelets: 152 10*3/uL (ref 150–400)
Platelets: 163 10*3/uL (ref 150–400)
Platelets: 177 10*3/uL (ref 150–400)
Platelets: 188 10*3/uL (ref 150–400)
RBC: 2.65 MIL/uL — ABNORMAL LOW (ref 4.22–5.81)
RBC: 2.93 MIL/uL — ABNORMAL LOW (ref 4.22–5.81)
RBC: 2.99 MIL/uL — ABNORMAL LOW (ref 4.22–5.81)
RBC: 3 MIL/uL — ABNORMAL LOW (ref 4.22–5.81)
RBC: 3.07 MIL/uL — ABNORMAL LOW (ref 4.22–5.81)
RDW: 16.2 % — ABNORMAL HIGH (ref 11.5–15.5)
RDW: 16.8 % — ABNORMAL HIGH (ref 11.5–15.5)
RDW: 16.9 % — ABNORMAL HIGH (ref 11.5–15.5)
RDW: 17 % — ABNORMAL HIGH (ref 11.5–15.5)
RDW: 17 % — ABNORMAL HIGH (ref 11.5–15.5)
WBC: 19.2 10*3/uL — ABNORMAL HIGH (ref 4.0–10.5)
WBC: 21.2 10*3/uL — ABNORMAL HIGH (ref 4.0–10.5)
WBC: 23.5 10*3/uL — ABNORMAL HIGH (ref 4.0–10.5)
WBC: 23.8 10*3/uL — ABNORMAL HIGH (ref 4.0–10.5)
WBC: 23.8 10*3/uL — ABNORMAL HIGH (ref 4.0–10.5)
nRBC: 0.5 % — ABNORMAL HIGH (ref 0.0–0.2)
nRBC: 0.7 % — ABNORMAL HIGH (ref 0.0–0.2)
nRBC: 0.7 % — ABNORMAL HIGH (ref 0.0–0.2)
nRBC: 0.7 % — ABNORMAL HIGH (ref 0.0–0.2)
nRBC: 1.2 % — ABNORMAL HIGH (ref 0.0–0.2)

## 2020-05-16 LAB — AMMONIA: Ammonia: 154 umol/L — ABNORMAL HIGH (ref 9–35)

## 2020-05-16 LAB — PROTIME-INR
INR: 1.9 — ABNORMAL HIGH (ref 0.8–1.2)
INR: 2 — ABNORMAL HIGH (ref 0.8–1.2)
INR: 2.5 — ABNORMAL HIGH (ref 0.8–1.2)
Prothrombin Time: 21.5 seconds — ABNORMAL HIGH (ref 11.4–15.2)
Prothrombin Time: 22.2 seconds — ABNORMAL HIGH (ref 11.4–15.2)
Prothrombin Time: 25.9 seconds — ABNORMAL HIGH (ref 11.4–15.2)

## 2020-05-16 LAB — PHOSPHORUS
Phosphorus: 7.7 mg/dL — ABNORMAL HIGH (ref 2.5–4.6)
Phosphorus: 4.9 mg/dL — ABNORMAL HIGH (ref 2.5–4.6)

## 2020-05-16 LAB — HEMOGLOBIN AND HEMATOCRIT, BLOOD
HCT: 26.7 % — ABNORMAL LOW (ref 39.0–52.0)
HCT: 23.4 % — ABNORMAL LOW (ref 39.0–52.0)
Hemoglobin: 8.9 g/dL — ABNORMAL LOW (ref 13.0–17.0)
Hemoglobin: 7.9 g/dL — ABNORMAL LOW (ref 13.0–17.0)

## 2020-05-16 LAB — MAGNESIUM
Magnesium: 1.1 mg/dL — ABNORMAL LOW (ref 1.7–2.4)
Magnesium: 2.1 mg/dL (ref 1.7–2.4)
Magnesium: 1.9 mg/dL (ref 1.7–2.4)
Magnesium: 2.1 mg/dL (ref 1.7–2.4)

## 2020-05-16 LAB — COMPREHENSIVE METABOLIC PANEL
ALT: 32 U/L (ref 0–44)
AST: 109 U/L — ABNORMAL HIGH (ref 15–41)
Albumin: 1.9 g/dL — ABNORMAL LOW (ref 3.5–5.0)
Alkaline Phosphatase: 74 U/L (ref 38–126)
Anion gap: 16 — ABNORMAL HIGH (ref 5–15)
BUN: 40 mg/dL — ABNORMAL HIGH (ref 6–20)
CO2: 19 mmol/L — ABNORMAL LOW (ref 22–32)
Calcium: 6.4 mg/dL — CL (ref 8.9–10.3)
Chloride: 99 mmol/L (ref 98–111)
Creatinine, Ser: 3.81 mg/dL — ABNORMAL HIGH (ref 0.61–1.24)
GFR calc Af Amer: 21 mL/min — ABNORMAL LOW (ref >60)
GFR calc non Af Amer: 18 mL/min — ABNORMAL LOW (ref >60)
Glucose, Bld: 125 mg/dL — ABNORMAL HIGH (ref 70–99)
Potassium: 3.1 mmol/L — ABNORMAL LOW (ref 3.5–5.1)
Sodium: 134 mmol/L — ABNORMAL LOW (ref 135–145)
Total Bilirubin: 4.7 mg/dL — ABNORMAL HIGH (ref 0.3–1.2)
Total Protein: 5.2 g/dL — ABNORMAL LOW (ref 6.5–8.1)

## 2020-05-16 LAB — LACTIC ACID, PLASMA
Lactic Acid, Venous: 5.6 mmol/L (ref 0.5–1.9)
Lactic Acid, Venous: 3 mmol/L (ref 0.5–1.9)

## 2020-05-16 LAB — MRSA PCR SCREENING

## 2020-05-16 LAB — URINALYSIS, ROUTINE W REFLEX MICROSCOPIC
Bilirubin Urine: NEGATIVE
Glucose, UA: 50 mg/dL — AB
Ketones, ur: NEGATIVE mg/dL
Leukocytes,Ua: NEGATIVE
Nitrite: NEGATIVE
Protein, ur: NEGATIVE mg/dL
Specific Gravity, Urine: 1.035 — ABNORMAL HIGH (ref 1.005–1.030)
pH: 5 (ref 5.0–8.0)

## 2020-05-16 LAB — BASIC METABOLIC PANEL
Anion gap: 14 (ref 5–15)
BUN: 45 mg/dL — ABNORMAL HIGH (ref 6–20)
CO2: 21 mmol/L — ABNORMAL LOW (ref 22–32)
Calcium: 6.5 mg/dL — ABNORMAL LOW (ref 8.9–10.3)
Chloride: 99 mmol/L (ref 98–111)
Creatinine, Ser: 3.5 mg/dL — ABNORMAL HIGH (ref 0.61–1.24)
GFR calc Af Amer: 23 mL/min — ABNORMAL LOW (ref >60)
GFR calc non Af Amer: 20 mL/min — ABNORMAL LOW (ref >60)
Glucose, Bld: 163 mg/dL — ABNORMAL HIGH (ref 70–99)
Potassium: 3.2 mmol/L — ABNORMAL LOW (ref 3.5–5.1)
Sodium: 134 mmol/L — ABNORMAL LOW (ref 135–145)

## 2020-05-16 LAB — DIC (DISSEMINATED INTRAVASCULAR COAGULATION)PANEL
D-Dimer, Quant: 5.42 ug/mL-FEU — ABNORMAL HIGH (ref 0.00–0.50)
Fibrinogen: 175 mg/dL — ABNORMAL LOW (ref 210–475)
INR: 2.5 — ABNORMAL HIGH (ref 0.8–1.2)
Platelets: 180 10*3/uL (ref 150–400)
Prothrombin Time: 26.1 seconds — ABNORMAL HIGH (ref 11.4–15.2)
Smear Review: NONE SEEN
aPTT: 39 seconds — ABNORMAL HIGH (ref 24–36)

## 2020-05-16 LAB — PREPARE RBC (CROSSMATCH)

## 2020-05-16 LAB — HIV ANTIBODY (ROUTINE TESTING W REFLEX): HIV Screen 4th Generation wRfx: NONREACTIVE

## 2020-05-16 LAB — TRIGLYCERIDES: Triglycerides: 246 mg/dL — ABNORMAL HIGH (ref <150)

## 2020-05-16 LAB — TROPONIN I (HIGH SENSITIVITY): Troponin I (High Sensitivity): 36 ng/L — ABNORMAL HIGH (ref <18)

## 2020-05-16 MED ORDER — CHLORHEXIDINE GLUCONATE CLOTH 2 % EX PADS: 6.0000 | MEDICATED_PAD | Freq: Every day | CUTANEOUS | Status: DC

## 2020-05-16 MED ORDER — PROPOFOL 1000 MG/100ML IV EMUL
5.0000 ug/kg/min | INTRAVENOUS | Status: DC
  Administered 2020-05-16: 5 ug/kg/min via INTRAVENOUS
  Administered 2020-05-16: 25 ug/kg/min via INTRAVENOUS
  Administered 2020-05-17 (×4): 40 ug/kg/min via INTRAVENOUS
  Administered 2020-05-18: 50 ug/kg/min via INTRAVENOUS
  Administered 2020-05-18: 40 ug/kg/min via INTRAVENOUS
  Administered 2020-05-18: 50 ug/kg/min via INTRAVENOUS
  Administered 2020-05-18: 30 ug/kg/min via INTRAVENOUS
  Administered 2020-05-18: 50 ug/kg/min via INTRAVENOUS
  Administered 2020-05-19: 25 ug/kg/min via INTRAVENOUS
  Administered 2020-05-19: 20 ug/kg/min via INTRAVENOUS
  Administered 2020-05-19: 35 ug/kg/min via INTRAVENOUS
  Filled 2020-05-16 (×17): qty 100

## 2020-05-16 MED ORDER — MAGNESIUM SULFATE 2 GM/50ML IV SOLN
2.0000 g | Freq: Once | INTRAVENOUS | Status: DC
  Filled 2020-05-16: qty 50

## 2020-05-16 MED ORDER — MIDAZOLAM 50MG/50ML (1MG/ML) PREMIX INFUSION
0.5000 mg/h | INTRAVENOUS | Status: DC
  Administered 2020-05-16: 2 mg/h via INTRAVENOUS
  Filled 2020-05-16: qty 50

## 2020-05-16 MED ORDER — ALBUMIN HUMAN 25 % IV SOLN
25.0000 g | Freq: Four times a day (QID) | INTRAVENOUS | Status: AC
  Administered 2020-05-16 – 2020-05-17 (×4): 25 g via INTRAVENOUS
  Filled 2020-05-16 (×4): qty 100

## 2020-05-16 MED ORDER — ALBUMIN HUMAN 25 % IV SOLN
12.5000 g | Freq: Once | INTRAVENOUS | Status: AC
  Administered 2020-05-16: 12.5 g via INTRAVENOUS
  Filled 2020-05-16: qty 50

## 2020-05-16 MED ORDER — VITAMIN K1 10 MG/ML IJ SOLN
5.0000 mg | Freq: Once | INTRAVENOUS | Status: AC
  Administered 2020-05-16: 5 mg via INTRAVENOUS
  Filled 2020-05-16: qty 0.5

## 2020-05-16 MED ORDER — ORAL CARE MOUTH RINSE
15.0000 mL | OROMUCOSAL | Status: DC
  Administered 2020-05-16 – 2020-05-19 (×35): 15 mL via OROMUCOSAL

## 2020-05-16 MED ORDER — CALCIUM GLUCONATE-NACL 1-0.675 GM/50ML-% IV SOLN: 1.0000 g | Freq: Once | INTRAVENOUS | Status: DC

## 2020-05-16 MED ORDER — POTASSIUM CHLORIDE 10 MEQ/50ML IV SOLN
10.0000 meq | INTRAVENOUS | Status: AC
  Administered 2020-05-16 (×2): 10 meq via INTRAVENOUS
  Filled 2020-05-16 (×2): qty 50

## 2020-05-16 MED ORDER — ROCURONIUM BROMIDE 50 MG/5ML IV SOLN: 50.0000 mg | Freq: Once | INTRAVENOUS | Status: DC

## 2020-05-16 MED ORDER — POTASSIUM CHLORIDE 10 MEQ/50ML IV SOLN
10.0000 meq | INTRAVENOUS | Status: DC
  Filled 2020-05-16: qty 50

## 2020-05-16 MED ORDER — RIFAXIMIN 550 MG PO TABS
550.0000 mg | ORAL_TABLET | Freq: Two times a day (BID) | ORAL | Status: DC
  Administered 2020-05-16 – 2020-05-19 (×7): 550 mg
  Filled 2020-05-16 (×7): qty 1

## 2020-05-16 MED ORDER — LACTULOSE 10 GM/15ML PO SOLN
30.0000 g | Freq: Four times a day (QID) | ORAL | Status: DC
  Administered 2020-05-16 – 2020-05-19 (×13): 30 g
  Filled 2020-05-16 (×13): qty 45

## 2020-05-16 MED ORDER — CHLORHEXIDINE GLUCONATE 0.12% ORAL RINSE (MEDLINE KIT)
15.0000 mL | Freq: Two times a day (BID) | OROMUCOSAL | Status: DC
  Administered 2020-05-16 – 2020-05-19 (×8): 15 mL via OROMUCOSAL

## 2020-05-16 MED ORDER — LACTATED RINGERS IV BOLUS
1000.0000 mL | Freq: Once | INTRAVENOUS | Status: AC
  Administered 2020-05-16: 1000 mL via INTRAVENOUS

## 2020-05-16 MED ORDER — CHLORHEXIDINE GLUCONATE CLOTH 2 % EX PADS
6.0000 | MEDICATED_PAD | Freq: Every day | CUTANEOUS | Status: DC
  Administered 2020-05-17 – 2020-05-25 (×10): 6 via TOPICAL

## 2020-05-16 MED ORDER — NOREPINEPHRINE 4 MG/250ML-% IV SOLN
INTRAVENOUS | Status: AC
  Filled 2020-05-16: qty 250

## 2020-05-16 MED ORDER — MIDAZOLAM HCL 2 MG/2ML IJ SOLN
1.0000 mg | INTRAMUSCULAR | Status: DC | PRN
  Administered 2020-05-16: 2 mg via INTRAVENOUS
  Filled 2020-05-16: qty 2

## 2020-05-16 MED ORDER — ALBUMIN HUMAN 5 % IV SOLN
INTRAVENOUS | Status: AC
  Filled 2020-05-16: qty 500

## 2020-05-16 MED ORDER — SODIUM CHLORIDE 0.9 % IV SOLN
1.0000 g | Freq: Once | INTRAVENOUS | Status: AC
  Administered 2020-05-16: 1 g via INTRAVENOUS
  Filled 2020-05-16: qty 10

## 2020-05-16 MED ORDER — POTASSIUM CHLORIDE 10 MEQ/50ML IV SOLN: 10.0000 meq | INTRAVENOUS | Status: DC

## 2020-05-16 MED ORDER — POTASSIUM CHLORIDE 10 MEQ/50ML IV SOLN
10.0000 meq | INTRAVENOUS | Status: AC
  Administered 2020-05-16 (×4): 10 meq via INTRAVENOUS
  Filled 2020-05-16 (×3): qty 50

## 2020-05-16 MED ORDER — LACTULOSE 10 GM/15ML PO SOLN: 30.0000 g | Freq: Three times a day (TID) | ORAL | Status: DC

## 2020-05-16 MED ORDER — ALBUMIN HUMAN 5 % IV SOLN
12.5000 g | Freq: Once | INTRAVENOUS | Status: AC
  Administered 2020-05-16: 12.5 g via INTRAVENOUS
  Filled 2020-05-16: qty 250

## 2020-05-16 MED ORDER — ALBUMIN HUMAN 5 % IV SOLN
12.5000 g | Freq: Once | INTRAVENOUS | Status: AC
  Administered 2020-05-16: 12.5 g via INTRAVENOUS

## 2020-05-16 MED ORDER — MAGNESIUM SULFATE 4 GM/100ML IV SOLN
4.0000 g | Freq: Once | INTRAVENOUS | Status: AC
  Administered 2020-05-16: 4 g via INTRAVENOUS
  Filled 2020-05-16: qty 100

## 2020-05-16 MED ORDER — HYDRALAZINE HCL 20 MG/ML IJ SOLN
10.0000 mg | INTRAMUSCULAR | Status: DC | PRN
  Administered 2020-05-19: 20 mg via INTRAVENOUS
  Filled 2020-05-16: qty 1

## 2020-05-16 MED ORDER — LACTULOSE ENEMA
300.0000 mL | Freq: Two times a day (BID) | ORAL | Status: DC
  Filled 2020-05-16 (×2): qty 300

## 2020-05-16 MED ORDER — DEXMEDETOMIDINE HCL IN NACL 400 MCG/100ML IV SOLN
0.4000 ug/kg/h | INTRAVENOUS | Status: DC
  Administered 2020-05-16: 1 ug/kg/h via INTRAVENOUS
  Administered 2020-05-16: 0.5 ug/kg/h via INTRAVENOUS
  Administered 2020-05-16: 0.4 ug/kg/h via INTRAVENOUS
  Administered 2020-05-16: 0.7 ug/kg/h via INTRAVENOUS
  Administered 2020-05-17 (×2): 1 ug/kg/h via INTRAVENOUS
  Administered 2020-05-17: 0.5 ug/kg/h via INTRAVENOUS
  Administered 2020-05-17 – 2020-05-18 (×6): 1 ug/kg/h via INTRAVENOUS
  Administered 2020-05-19: 1.2 ug/kg/h via INTRAVENOUS
  Administered 2020-05-19: 1 ug/kg/h via INTRAVENOUS
  Administered 2020-05-19: 1.2 ug/kg/h via INTRAVENOUS
  Administered 2020-05-19: 0.7 ug/kg/h via INTRAVENOUS
  Administered 2020-05-19: 0.9 ug/kg/h via INTRAVENOUS
  Administered 2020-05-20 (×3): 1.2 ug/kg/h via INTRAVENOUS
  Administered 2020-05-20: 0.8 ug/kg/h via INTRAVENOUS
  Filled 2020-05-16: qty 100
  Filled 2020-05-16: qty 300
  Filled 2020-05-16 (×18): qty 100

## 2020-05-16 MED ORDER — ROCURONIUM BROMIDE 10 MG/ML (PF) SYRINGE
PREFILLED_SYRINGE | INTRAVENOUS | Status: AC
  Administered 2020-05-16: 50 mg
  Filled 2020-05-16: qty 10

## 2020-05-16 MED ORDER — SODIUM CHLORIDE 0.9% IV SOLUTION: Freq: Once | INTRAVENOUS | Status: AC

## 2020-05-16 MED ORDER — CALCIUM GLUCONATE-NACL 1-0.675 GM/50ML-% IV SOLN
1.0000 g | Freq: Once | INTRAVENOUS | Status: AC
  Administered 2020-05-16: 1000 mg via INTRAVENOUS
  Filled 2020-05-16: qty 50

## 2020-05-16 MED ORDER — ETOMIDATE 2 MG/ML IV SOLN
INTRAVENOUS | Status: AC
  Administered 2020-05-16: 20 mg via INTRAVENOUS
  Filled 2020-05-16: qty 10

## 2020-05-16 MED ORDER — ETOMIDATE 2 MG/ML IV SOLN: 20.0000 mg | Freq: Once | INTRAVENOUS | Status: AC

## 2020-05-16 MED ORDER — NOREPINEPHRINE 4 MG/250ML-% IV SOLN
0.0000 ug/min | INTRAVENOUS | Status: DC
  Administered 2020-05-16: 10 ug/min via INTRAVENOUS
  Administered 2020-05-16: 6 ug/min via INTRAVENOUS
  Filled 2020-05-16 (×2): qty 250

## 2020-05-16 MED ORDER — METHYLPREDNISOLONE SODIUM SUCC 40 MG IJ SOLR
40.0000 mg | INTRAMUSCULAR | Status: DC
  Administered 2020-05-16 – 2020-05-19 (×4): 40 mg via INTRAVENOUS
  Filled 2020-05-16 (×4): qty 1

## 2020-05-16 MED ORDER — MAGNESIUM SULFATE 2 GM/50ML IV SOLN: 2.0000 g | Freq: Once | INTRAVENOUS | Status: DC

## 2020-05-16 MED ORDER — SODIUM CHLORIDE 0.9% IV SOLUTION: Freq: Once | INTRAVENOUS | Status: DC

## 2020-05-16 NOTE — Procedures (Signed)
Intubation Procedure Note  Jimmy Gonzales  [id number]  14-Mar-1971  Date:05/16/20  Time:10:23 AM   Provider Performing:Lazarus [place]bury E Derrico Zhong    Procedure: Intubation (34961)  Indication(s) Respiratory Failure  Consent Unable to obtain consent due to emergent nature of procedure.   Anesthesia Etomidate and Rocuronium in addition to continuous propofol, precedex and fentanyl infusions    Time Out Verified patient identification, verified procedure, site/side was marked, verified correct patient position, special equipment/implants available, medications/allergies/relevant history reviewed, required imaging and test results available.   Sterile Technique Usual hand hygeine, masks, and gloves were used   Procedure Description Patient positioned in bed supine.  Sedation given as noted above. 4 Glidescope used for visualization which shows original ETT balloon above vocal cords. ETT removed.  Patient was intubated with endotracheal tube using Glidescope.  View was Grade 1 full glottis .  Number of attempts was 2, with PCCM physican at bedside throughout for assistance. Colorimetric CO2 detector was consistent with tracheal placement.   Complications/Tolerance None; patient tolerated the procedure well. Chest X-ray is ordered to verify placement.   EBL Minimal   Specimen(s) None   Eliseo Gum MSN, AGACNP-BC [place] [phone] If no answer, [phone] 05/16/2020, 10:25 AM

## 2020-05-16 NOTE — Progress Notes (Addendum)
[facility] Progress Note Patient Name: Jimmy Gonzales DOB: 11/11/1970 MRN: [id number]   Date of Service  05/16/2020  HPI/Events of Note  Hypotension, sub-optimal sedation due to the need to hold Precedex due to hypotension.  eICU Interventions  Albumin 250 ml iv bolus, Norepinephrine infusion PRN, Versed 1-2 mg Q 2 hours PRN as a Precedex sparing adjunct. Stat H & H .        Kerry Kass [place] Dann 05/16/2020, 3:39 AM

## 2020-05-16 NOTE — Consult Note (Signed)
Nephrology Consult   Requesting provider: June Leap Service requesting consult: CCM Reason for consult: AKI   Assessment/Recommendations: Jimmy Gonzales is a/an 49 y.o. male with a past medical history alcohol use disorder who present w/ hemorrhagic/mixed shock, decompensated liver disease, variceal bleeding, and AKI   Non-Oliguric AKI: Likely secondary to hemorrhagic shock and possible prerenal state as well as some degree of ATN.  Continues to have relatively good urine output.  Creatinine improved today.  Given his overall poor clinical status he is at risk for having worsening creatinine in the future unless he begins to improve.  Presumed baseline is normal but we do not know this for sure. -Continue hemodynamic support and blood product transfusion per primary team -Continue to monitor daily Cr, Dose meds for GFR -Monitor Daily I/Os, Daily weight  -Maintain MAP>65 for optimal renal perfusion.  -Avoid nephrotoxic medications including NSAIDs and Vanc/Zosyn combo -Consider renal ultrasound if patient's kidney function fails to improve -Currently no indication for HD  Acute respiratory failure ventilatory dependence: Continues on the vent per primary team  Metabolic encephalopathy: Likely multifactorial with alcohol use high risk for alcohol withdrawal.  Possible hepatic encephalopathy given hyperammonemia although this could be related to absorption of blood products.  Potentially TIPS could worsen ongoing encephalopathy.  Continue management per primary team  Hemorrhagic shock secondary to variceal bleeding: Status post TIPS.  Continued blood product and management of varices administration per primary team  Alcoholic cirrhosis with portal hypertension: Meld 35.  Status post TIPS  Hypotension/shock: Multifactorial related to liver disease and possible infection.  Pressors and antibiotics per primary team  Hypokalemia: Potassium 3.2 most recently continue supplementation as  able  Hyperphosphatemia: Potassium was 7.7 yesterday and improved to 4.9 today likely in the setting of improving kidney function   Recommendations conveyed to primary service.    [facility] 05/16/2020 3:00 PM   _____________________________________________________________________________________ CC: AKI  History of Present Illness: Jimmy R Branford is a/an 49 y.o. male with a past medical history of alcohol use disorder who presents with severe upper GI bleed.  Patient was intubated on my interview so history was obtained per chart review  Patient presented initially on 8/18 to [facility] with hematemesis.  The patient had abdominal pain and vomiting for 2 days prior to this presentation.  On arrival to the outside hospital he had a large ventral hernia that appeared incarcerated on CT scan but was able to be reduced.  Because of his upper GI bleed he was admitted to the ICU and started on octreotide but he continued to have hematemesis requiring intubation and starting on pressors.  He also required multiple blood transfusions and emergent endoscopy.  Patient was noted to have severe is variceal bleeding which could not be controlled.  The patient was transferred to [facility] for emergent TIPS.  The patient underwent TIPS yesterday.  Since that time some hypotension and continued anemia.  Creatinine has improved from 3.8 yesterday to 3.5 today.   Medications:  Current Facility-Administered Medications  Medication Dose Route Frequency Provider Last Rate Last Admin  . 0.9 %  sodium chloride infusion (Manually program via Guardrails IV Fluids)   Intravenous Once Bowser, Laurel Dimmer, NP      . 0.9 %  sodium chloride infusion  250 mL Intravenous Continuous Bowser, Laurel Dimmer, NP      . chlorhexidine gluconate (MEDLINE KIT) (PERIDEX) 0.12 % solution 15 mL  15 mL Mouth Rinse BID Frederik Pear, MD  15 mL at 05/16/20 0801  . Chlorhexidine Gluconate Cloth 2 %  PADS 6 each  6 each Topical Daily Bowser, Laurel Dimmer, NP      . dexmedetomidine (PRECEDEX) 400 MCG/100ML (4 mcg/mL) infusion  0.4-1.2 mcg/kg/hr Intravenous Titrated Corey Harold, NP 17.87 mL/hr at 05/16/20 1200 0.7 mcg/kg/hr at 05/16/20 1200  . fentaNYL (SUBLIMAZE) bolus via infusion 50 mcg  50 mcg Intravenous Q15 min PRN Corey Harold, NP      . fentaNYL (SUBLIMAZE) injection 100 mcg  100 mcg Intravenous Once Corey Harold, NP      . fentaNYL 2577mcg in NS 275mL (20mcg/ml) infusion-PREMIX  50-200 mcg/hr Intravenous Continuous Corey Harold, NP 20 mL/hr at 05/16/20 1200 200 mcg/hr at 05/16/20 1200  . hydrALAZINE (APRESOLINE) injection 10-20 mg  10-20 mg Intravenous Q4H PRN Corey Harold, NP      . iohexol (OMNIPAQUE) 300 MG/ML solution 100 mL  100 mL Intravenous Once PRN Aletta Edouard, MD      . iohexol (OMNIPAQUE) 300 MG/ML solution 100 mL  100 mL Intravenous Once PRN Aletta Edouard, MD      . iohexol (OMNIPAQUE) 300 MG/ML solution 150 mL  150 mL Intravenous Once PRN Aletta Edouard, MD      . lactated ringers infusion   Intravenous Continuous Cristal Generous, NP 50 mL/hr at 05/16/20 1349 New Bag at 05/16/20 1349  . lactulose (CHRONULAC) enema 200 gm  300 mL Rectal BID Bowser, Laurel Dimmer, NP      . MEDLINE mouth rinse  15 mL Mouth Rinse 10 times per day Frederik Pear, MD   15 mL at 05/16/20 1345  . midazolam (VERSED) injection 1-2 mg  1-2 mg Intravenous Q4H PRN Frederik Pear, MD   2 mg at 05/16/20 0747  . norepinephrine (LEVOPHED) 4mg  in 223mL premix infusion  0-40 mcg/min Intravenous Titrated Ogan, Okoronkwo U, MD 7.5 mL/hr at 05/16/20 1200 2 mcg/min at 05/16/20 1200  . octreotide (SANDOSTATIN) 500 mcg in sodium chloride 0.9 % 250 mL (2 mcg/mL) infusion  50 mcg/hr Intravenous Continuous Cristal Generous, NP 25 mL/hr at 05/16/20 1423 50 mcg/hr at 05/16/20 1423  . pantoprazole (PROTONIX) 80 mg in sodium chloride 0.9 % 100 mL (0.8 mg/mL) infusion  8 mg/hr Intravenous Continuous Cristal Generous, NP 10 mL/hr at 05/16/20 0056 8 mg/hr at 05/16/20 0056  . [START ON 05/19/2020] pantoprazole (PROTONIX) injection 40 mg  40 mg Intravenous Q12H Bowser, Grace E, NP      . piperacillin-tazobactam (ZOSYN) IVPB 3.375 g  3.375 g Intravenous Q8H McQuaid, Douglas B, MD 12.5 mL/hr at 05/16/20 1345 3.375 g at 05/16/20 1345  . polyethylene glycol (MIRALAX / GLYCOLAX) packet 17 g  17 g Oral Daily PRN Bowser, Laurel Dimmer, NP      . polyethylene glycol (MIRALAX / GLYCOLAX) packet 17 g  17 g Oral Daily Bowser, Grace E, NP      . propofol (DIPRIVAN) 1000 MG/100ML infusion  5-80 mcg/kg/min Intravenous Titrated Cristal Generous, NP 15.32 mL/hr at 05/16/20 1427 25 mcg/kg/min at 05/16/20 1427  . rifaximin (XIFAXAN) tablet 550 mg  550 mg Per Tube BID Bowser, Laurel Dimmer, NP      . rocuronium (ZEMURON) injection 50 mg  50 mg Intravenous Once Icard, Bradley L, DO      . thiamine (B-1) injection 100 mg  100 mg Intravenous Daily Simonne Maffucci B, MD   100 mg at 05/16/20 1057     ALLERGIES Patient has  no known allergies.  MEDICAL HISTORY History reviewed. No pertinent past medical history.   SOCIAL HISTORY Social History   Socioeconomic History  . Marital status: Married    Spouse name: Neftali Abair   . Number of children: Not on file  . Years of education: Not on file  . Highest education level: Not on file  Occupational History  . Not on file  Tobacco Use  . Smoking status: Unknown If Ever Smoked  Substance and Sexual Activity  . Alcohol use: Yes    Comment: "drinks 1 gallon of booze a day"  . Drug use: Not on file  . Sexual activity: Not on file  Other Topics Concern  . Not on file  Social History Narrative  . Not on file   Social Determinants of Health   Financial Resource Strain:   . Difficulty of Paying Living Expenses: Not on file  Food Insecurity:   . Worried About Charity fundraiser in the Last Year: Not on file  . Ran Out of Food in the Last Year: Not on file  Transportation  Needs:   . Lack of Transportation (Medical): Not on file  . Lack of Transportation (Non-Medical): Not on file  Physical Activity:   . Days of Exercise per Week: Not on file  . Minutes of Exercise per Session: Not on file  Stress:   . Feeling of Stress : Not on file  Social Connections:   . Frequency of Communication with Friends and Family: Not on file  . Frequency of Social Gatherings with Friends and Family: Not on file  . Attends Religious Services: Not on file  . Active Member of Clubs or Organizations: Not on file  . Attends Archivist Meetings: Not on file  . Marital Status: Not on file  Intimate Partner Violence:   . Fear of Current or Ex-Partner: Not on file  . Emotionally Abused: Not on file  . Physically Abused: Not on file  . Sexually Abused: Not on file     FAMILY HISTORY History reviewed. No pertinent family history.  Unable to obtain family history due to the patient's altered mental status   Review of Systems: Unable to obtain review of systems due to patient's altered mental status  Physical Exam: Vitals:   05/16/20 1345 05/16/20 1400  BP:  98/62  Pulse: 71 71  Resp: 15 15  Temp:    SpO2: 100% 100%   Total I/O In: 2311.2 [I.V.:485.6; Blood:530; IV Piggyback:1295.5] Out: 1200 [Urine:1200]  Intake/Output Summary (Last 24 hours) at 05/16/2020 1500 Last data filed at 05/16/2020 1400 Gross per 24 hour  Intake 5976.89 ml  Output 2250 ml  Net 3726.89 ml   General: Ill-appearing, intubated HEENT: anicteric sclera, oropharynx clear without lesions CV: Normal rate, trace pitting edema in the bilateral lower extremities Lungs: Coarse bilateral breath sounds, ventilated, bilateral chest rise Abd: non-tender, mild distention, bowel sounds present Skin: no visible lesions or rashes Psych: Sedated, does not respond to touch Musculoskeletal: no obvious deformities Neuro: Unable to assess due to the patient's sedation  Test Results Reviewed Lab  Results  Component Value Date   NA 134 (L) 05/16/2020   K 3.2 (L) 05/16/2020   CL 99 05/16/2020   CO2 21 (L) 05/16/2020   BUN 45 (H) 05/16/2020   CREATININE 3.50 (H) 05/16/2020   CALCIUM 6.5 (L) 05/16/2020   ALBUMIN 1.9 (L) 05/16/2020   PHOS 4.9 (H) 05/16/2020     I have reviewed all relevant  outside healthcare records related to the patient's kidney injury.

## 2020-05-16 NOTE — Progress Notes (Signed)
ETT advanced from 17 cm at lip to 21cm. NP at bedside for loss of tidal volume.

## 2020-05-16 NOTE — Consult Note (Signed)
Referring Provider: Dr. June Leap Primary Care Physician:  Patient, No Pcp Per Primary Gastroenterologist: Althia Forts  Reason for Consultation: Variceal bleeding, cirrhosis  HPI: Jimmy Gonzales is a 49 y.o. male with history of alcohol use presenting with shock secondary to variceal bleeding presenting for consultation of variceal bleeding and cirrhosis.  Patient is intubated and sedated and thus not able to provide any subjective data.  Patient's wife is at bedside.  She states that patient has been having yellowing of the skin and eyes for the past several months, as well as worsening abdominal distention.  He has also had some mild pedal edema.  Patient has never had a diagnosis of cirrhosis and did not seek medical care until presenting to the ED.  A couple days ago, patient started having some generalized abdominal pain.  He then started experiencing hematemesis and presented to the [facility] ED.  He underwent TIPS today.  Patient has at least 2-3 servings of liquor per day, with many more on the weekends.  He has had this amount of alcohol consistently for the past several years.  No known history of liver disease or gastrointestinal malignancy.  Patient has never had a colonoscopy.  History reviewed. No pertinent past medical history.  Past Surgical History:  Procedure Laterality Date  . IR ANGIOGRAM SELECTIVE EACH ADDITIONAL VESSEL  05/16/2020  . IR ANGIOGRAM SELECTIVE EACH ADDITIONAL VESSEL  05/16/2020  . IR ANGIOGRAM SELECTIVE EACH ADDITIONAL VESSEL  05/16/2020  . IR EMBO ART  VEN HEMORR LYMPH EXTRAV  INC GUIDE ROADMAPPING  05/16/2020  . IR TIPS  05/16/2020  . RADIOLOGY WITH ANESTHESIA N/A 05/15/2020   Procedure: TIPS PROCEDURE;  Surgeon: Radiologist, Medication, MD;  Location: [facility];  Service: Radiology;  Laterality: N/A;    Prior to Admission medications   Medication Sig Start Date End Date Taking? Authorizing Provider  bisoprolol-hydrochlorothiazide (ZIAC) 5-6.25 MG  tablet Take 1 tablet by mouth daily. 04/18/20  Yes [provider]  cloNIDine (CATAPRES) 0.1 MG tablet Take 0.1 mg by mouth 3 (three) times daily. 03/03/20  Yes [provider]  levothyroxine (SYNTHROID) 25 MCG tablet Take 25 mcg by mouth daily. 04/21/20  Yes [provider]  Naphazoline-Pheniramine (OPCON-A) 0.027-0.315 % SOLN Place 1 drop into both eyes daily as needed (dry eyes/irritation).   Yes [provider]  omega-3 acid ethyl esters (LOVAZA) 1 g capsule Take 2 capsules by mouth daily. 04/29/20  Yes [provider]  omeprazole (PRILOSEC) 20 MG capsule Take 20 mg by mouth daily. 02/19/20  Yes [provider]    Scheduled Meds: . sodium chloride   Intravenous Once  . chlorhexidine gluconate (MEDLINE KIT)  15 mL Mouth Rinse BID  . Chlorhexidine Gluconate Cloth  6 each Topical Daily  . fentaNYL (SUBLIMAZE) injection  100 mcg Intravenous Once  . lactulose  300 mL Rectal BID  . mouth rinse  15 mL Mouth Rinse 10 times per day  . [START ON 05/19/2020] pantoprazole  40 mg Intravenous Q12H  . polyethylene glycol  17 g Oral Daily  . rifaximin  550 mg Per Tube BID  . rocuronium  50 mg Intravenous Once  . thiamine  100 mg Intravenous Daily   Continuous Infusions: . sodium chloride    . dexmedetomidine (PRECEDEX) IV infusion 0.5 mcg/kg/hr (05/16/20 1700)  . fentaNYL infusion INTRAVENOUS 200 mcg/hr (05/16/20 1700)  . lactated ringers 50 mL/hr at 05/16/20 1700  . norepinephrine (LEVOPHED) Adult infusion 4 mcg/min (05/16/20 1700)  . octreotide  (SANDOSTATIN)  IV infusion 50 mcg/hr (05/16/20 1700)  . pantoprozole (PROTONIX) infusion 8 mg/hr (05/16/20 0056)  . piperacillin-tazobactam (ZOSYN)  IV 12.5 mL/hr at 05/16/20 1700  . propofol (DIPRIVAN) infusion 25 mcg/kg/min (05/16/20 1700)   PRN Meds:.fentaNYL, hydrALAZINE, iohexol, iohexol, iohexol, midazolam, polyethylene glycol  Allergies as of 05/15/2020  . (Not on File)    History reviewed. No  pertinent family history.  Social History   Socioeconomic History  . Marital status: Married    Spouse name: Tahsin Benyo   . Number of children: Not on file  . Years of education: Not on file  . Highest education level: Not on file  Occupational History  . Not on file  Tobacco Use  . Smoking status: Unknown If Ever Smoked  Substance and Sexual Activity  . Alcohol use: Yes    Comment: "drinks 1 gallon of booze a day"  . Drug use: Not on file  . Sexual activity: Not on file  Other Topics Concern  . Not on file  Social History Narrative  . Not on file   Social Determinants of Health   Financial Resource Strain:   . Difficulty of Paying Living Expenses: Not on file  Food Insecurity:   . Worried About Charity fundraiser in the Last Year: Not on file  . Ran Out of Food in the Last Year: Not on file  Transportation Needs:   . Lack of Transportation (Medical): Not on file  . Lack of Transportation (Non-Medical): Not on file  Physical Activity:   . Days of Exercise per Week: Not on file  . Minutes of Exercise per Session: Not on file  Stress:   . Feeling of Stress : Not on file  Social Connections:   . Frequency of Communication with Friends and Family: Not on file  . Frequency of Social Gatherings with Friends and Family: Not on file  . Attends Religious Services: Not on file  . Active Member of Clubs or Organizations: Not on file  . Attends Archivist Meetings: Not on file  . Marital Status: Not on file  Intimate Partner Violence:   . Fear of Current or Ex-Partner: Not on file  . Emotionally Abused: Not on file  . Physically Abused: Not on file  . Sexually Abused: Not on file    Review of Systems: Unable to obtain due to patient status (intubated and sedated)  Physical Exam: Vital signs: Vitals:   05/16/20 1645 05/16/20 1700  BP:  (!) 88/58  Pulse: 72 71  Resp: 14 16  Temp:    SpO2: 100% 100%   Last BM Date: 05/16/20 Physical Exam Vitals  reviewed.  Constitutional:      Appearance: He is ill-appearing.     Interventions: He is sedated and intubated.  HENT:     Head: Normocephalic and atraumatic.     Nose: Nose normal.     Mouth/Throat:     Mouth: Mucous membranes are moist.     Pharynx: Oropharynx is clear.  Eyes:     General: Scleral icterus present.  Cardiovascular:     Rate and Rhythm: Normal rate and regular rhythm.     Pulses: Normal pulses.     Heart sounds: Normal heart sounds.  Pulmonary:     Effort: Pulmonary effort is normal. No respiratory distress. He is intubated.     Breath sounds: Normal breath sounds.  Abdominal:     General: Bowel sounds are normal. There is distension.     Palpations:  Abdomen is soft. There is no mass.     Tenderness: There is no abdominal tenderness. There is no guarding or rebound.     Hernia: A hernia (umbilical, reducible) is present.  Musculoskeletal:     Cervical back: Normal range of motion and neck supple.     Right lower leg: No edema.     Left lower leg: No edema.     Comments: SCDs in place  Skin:    General: Skin is warm and dry.     Coloration: Skin is jaundiced.  Neurological:     Comments: Unable to assess as patient is intubated and sedated  Psychiatric:     Comments: Unable to assess as patient is intubated and sedated      GI:  Lab Results: Recent Labs    05/16/20 0019 05/16/20 0026 05/16/20 0346 05/16/20 0610 05/16/20 1430  WBC 23.8*  --   --  23.8* 19.2*  HGB 9.1*   < > 8.9* 7.9*  7.9* 8.9*  HCT 27.4*   < > 26.7* 23.6*  23.4* 25.7*  PLT 180  188  --   --  177 140*   < > = values in this interval not displayed.   BMET Recent Labs    05/16/20 0019 05/16/20 0026 05/16/20 0610  NA 134* 139 134*  K 3.1* 2.8* 3.2*  CL 99  --  99  CO2 19*  --  21*  GLUCOSE 125*  --  163*  BUN 40*  --  45*  CREATININE 3.81*  --  3.50*  CALCIUM 6.4*  --  6.5*   LFT Recent Labs    05/16/20 0019  PROT 5.2*  ALBUMIN 1.9*  AST 109*  ALT 32   ALKPHOS 74  BILITOT 4.7*   PT/INR Recent Labs    05/16/20 0019 05/16/20 1430  LABPROT 26.1*  25.9* 22.2*  INR 2.5*  2.5* 2.0*     Studies/Results: IR Angiogram Selective Each Additional Vessel  Result Date: 05/16/2020 CLINICAL DATA:  Alcoholic liver disease and acute bleeding esophageal varices. Persistent bleeding despite endoscopic variceal banding and critical illness with hypotension and shock requiring blood transfusion, pressor therapy and intubation. The patient has been emergently transferred from [facility] for a TIPS procedure. EXAM: 1. TRANSJUGULAR INTRAHEPATIC PORTOSYSTEMIC SHUNT PLACEMENT 2. ADDITIONAL CATHETERIZATION OF LEFT GASTRIC VEIN AND TWO ADDITIONAL VARICEAL TRUNKS OFF OF PORTAL CONFLUENCE AND SPLENIC VEIN 3. TRANSCATHETER EMBOLIZATION OF VARICES FOR HEMORRHAGE ASSISTANT: ADAM HENN, M.D. MEDICATIONS: As antibiotic prophylaxis, 2 g IV Ancef was ordered pre-procedure and administered intravenously within one hour of incision. ANESTHESIA/SEDATION: General anesthesia-as administered by the Anesthesia department CONTRAST:  100 mL Omnipaque 300 FLUOROSCOPY TIME:  Fluoroscopy Time: 89 minutes and 30 seconds. 3961 mGy. COMPLICATIONS: None immediate. PROCEDURE: Informed written consent was obtained from the patient's wife after a thorough discussion of the procedural risks, benefits and alternatives. All questions were addressed. Maximal Sterile Barrier Technique was utilized including caps, mask, sterile gowns, sterile gloves, sterile drape, hand hygiene and skin antiseptic. A timeout was performed prior to the initiation of the procedure. The right neck and abdominal wall were prepped with chlorhexidine and draped. Ultrasound was performed of the liver. Under direct ultrasound guidance, portal vein access was performed in the right lobe with a 21 gauge needle. A guidewire was advanced. A 3 French dilator was then advanced over the wire and into the portal vein. Portal  venography was performed through the dilator. Ultrasound was used to confirm patency of the right  internal jugular vein. Under direct ultrasound guidance, access of the right internal jugular vein was performed with a 21 gauge needle and micropuncture set. Over a guidewire, venous access was dilated and a 10 French angled sheath advanced. The sheath was positioned in the superior aspect of the inferior vena cava. A 5 French catheter was then used to selectively catheterize the right hepatic vein. The catheter was advanced in the right hepatic vein and selective hepatic venography performed. The 10 French sheath was advanced into the right hepatic vein. A Colapinto needle and needle sheath were advanced through the 10 French sheath. The needle was then advanced in the liver parenchyma and used to gain access to the level of the right portal vein utilizing the portal 3 [place] dilator as a target for needle puncture. After gaining access into the right portal venous system, a guidewire was advanced. A catheter was then able to be advanced over the guidewire and into the portal vein via hepatic venous access. Portal venography was performed. Over a guidewire, the intraparenchymal tract between the right hepatic vein and right portal vein was then dilated utilizing 5 mm and 7 mm diameter balloons. This allowed advancement of the 10 French sheath into the portal vein. A marking pigtail catheter was then utilized in performing venography to estimate length of covered stent required for shunt placement. A Gore Viatorr covered stent prosthesis with diameter of 10 mm, covered stent length of 8 cm and uncovered length of 2 cm was chosen for placement. The stent was advanced through the sheath and deployed. After deployment additional venography was performed. The entire prosthesis was dilated to 8 mm. A 5 French catheter was used to selectively catheterize the left gastric vein. Selective venography was performed. The catheter  was further advanced into the vein. Embolization coils were then advanced through the 5 French catheter to the level of varices. The 5 French catheter was used to selectively catheterize an additional variceal trunk off of the splenic vein. Selective venography was performed. A Lantern microcatheter was advanced through the 5 [place] catheter. Multiple Ruby microcoils were than deployed via the microcatheter within varices. An Amplatzer Vascular Plug 4 was deployed through the 5 [place] catheter. A third variceal trunk off of the splenic vein was then catheterized with a 5 French catheter. The Lantern microcatheter was advanced through the 5 [place] catheter. Multiple Ruby microcoils were deployed within varices. Additional venography was performed during embolization to assess flow. Additional venography was performed as well as portosystemic pressure measurements. The TIPS shunt was dilated to 10 mm. Additional pressure measurements were made. The 3 French portal vein dilator was removed and a dressing applied at the abdominal exit site. The 10 French sheath was then removed from the right jugular access site and hemostasis obtained with manual compression. A dressing was applied at the exit site. FINDINGS: After portal venous access portal venography demonstrates widely patent main portal vein and intrahepatic left and right portal veins. No evidence of portal vein thrombus. Slow flow is present but flow is towards the liver. After hepatic vein catheterization, the right hepatic vein demonstrates normal patency. After establishing needle access between the right hepatic vein and right portal vein, portal venography demonstrates a large left gastric vein emanating from the portal confluence and supplying esophageal varices as well as 2 additional trunks emanating from the central aspect of the splenic vein and supplying esophageal varices. A TIPS shunt was successfully created. After covered stent deployment,  variceal embolization was performed at  the level of 3 separate trunks supplying varices with deployment of embolization coils as well as an Amplatzer Vascular Plug. There was successful diminishment in flow to varices. After TIPS creation and dilatation there was excellent flow through the shunt. Initial pressure gradient measurements demonstrated a portosystemic pressure gradient of approximately 7-8 mm Hg. With repeat measurement there did appear to be some additional gradient at the level of the hepatic vein just beyond the stent stented segment near the hepatic vein confluence. Venography did not demonstrate significant stenosis of the vein in this region and this segment of the vein was dilated to 10 mm. It was not felt necessary to place another stent or covered stent to extend the stented segment. IMPRESSION: Successful placement of TIPS shunt between right hepatic vein and right portal vein with placement of 10 mm diameter Viatorr covered stent dilated to 10 mm. The left gastric vein and 2 additional trunks supplying prominent esophageal varices were also embolized with embolization coils a vascular plug. Electronically Signed   By: Aletta Edouard M.D.   On: 05/16/2020 13:21   IR Angiogram Selective Each Additional Vessel  Result Date: 05/16/2020 CLINICAL DATA:  Alcoholic liver disease and acute bleeding esophageal varices. Persistent bleeding despite endoscopic variceal banding and critical illness with hypotension and shock requiring blood transfusion, pressor therapy and intubation. The patient has been emergently transferred from [facility] for a TIPS procedure. EXAM: 1. TRANSJUGULAR INTRAHEPATIC PORTOSYSTEMIC SHUNT PLACEMENT 2. ADDITIONAL CATHETERIZATION OF LEFT GASTRIC VEIN AND TWO ADDITIONAL VARICEAL TRUNKS OFF OF PORTAL CONFLUENCE AND SPLENIC VEIN 3. TRANSCATHETER EMBOLIZATION OF VARICES FOR HEMORRHAGE ASSISTANT: ADAM HENN, M.D. MEDICATIONS: As antibiotic prophylaxis, 2 g IV Ancef was  ordered pre-procedure and administered intravenously within one hour of incision. ANESTHESIA/SEDATION: General anesthesia-as administered by the Anesthesia department CONTRAST:  100 mL Omnipaque 300 FLUOROSCOPY TIME:  Fluoroscopy Time: 89 minutes and 30 seconds. 3961 mGy. COMPLICATIONS: None immediate. PROCEDURE: Informed written consent was obtained from the patient's wife after a thorough discussion of the procedural risks, benefits and alternatives. All questions were addressed. Maximal Sterile Barrier Technique was utilized including caps, mask, sterile gowns, sterile gloves, sterile drape, hand hygiene and skin antiseptic. A timeout was performed prior to the initiation of the procedure. The right neck and abdominal wall were prepped with chlorhexidine and draped. Ultrasound was performed of the liver. Under direct ultrasound guidance, portal vein access was performed in the right lobe with a 21 gauge needle. A guidewire was advanced. A 3 French dilator was then advanced over the wire and into the portal vein. Portal venography was performed through the dilator. Ultrasound was used to confirm patency of the right internal jugular vein. Under direct ultrasound guidance, access of the right internal jugular vein was performed with a 21 gauge needle and micropuncture set. Over a guidewire, venous access was dilated and a 10 French angled sheath advanced. The sheath was positioned in the superior aspect of the inferior vena cava. A 5 French catheter was then used to selectively catheterize the right hepatic vein. The catheter was advanced in the right hepatic vein and selective hepatic venography performed. The 10 French sheath was advanced into the right hepatic vein. A Colapinto needle and needle sheath were advanced through the 10 French sheath. The needle was then advanced in the liver parenchyma and used to gain access to the level of the right portal vein utilizing the portal 3 [place] dilator as a target for  needle puncture. After gaining access into the right portal venous  system, a guidewire was advanced. A catheter was then able to be advanced over the guidewire and into the portal vein via hepatic venous access. Portal venography was performed. Over a guidewire, the intraparenchymal tract between the right hepatic vein and right portal vein was then dilated utilizing 5 mm and 7 mm diameter balloons. This allowed advancement of the 10 French sheath into the portal vein. A marking pigtail catheter was then utilized in performing venography to estimate length of covered stent required for shunt placement. A Gore Viatorr covered stent prosthesis with diameter of 10 mm, covered stent length of 8 cm and uncovered length of 2 cm was chosen for placement. The stent was advanced through the sheath and deployed. After deployment additional venography was performed. The entire prosthesis was dilated to 8 mm. A 5 French catheter was used to selectively catheterize the left gastric vein. Selective venography was performed. The catheter was further advanced into the vein. Embolization coils were then advanced through the 5 French catheter to the level of varices. The 5 French catheter was used to selectively catheterize an additional variceal trunk off of the splenic vein. Selective venography was performed. A Lantern microcatheter was advanced through the 5 [place] catheter. Multiple Ruby microcoils were than deployed via the microcatheter within varices. An Amplatzer Vascular Plug 4 was deployed through the 5 [place] catheter. A third variceal trunk off of the splenic vein was then catheterized with a 5 French catheter. The Lantern microcatheter was advanced through the 5 [place] catheter. Multiple Ruby microcoils were deployed within varices. Additional venography was performed during embolization to assess flow. Additional venography was performed as well as portosystemic pressure measurements. The TIPS shunt was dilated to 10  mm. Additional pressure measurements were made. The 3 French portal vein dilator was removed and a dressing applied at the abdominal exit site. The 10 French sheath was then removed from the right jugular access site and hemostasis obtained with manual compression. A dressing was applied at the exit site. FINDINGS: After portal venous access portal venography demonstrates widely patent main portal vein and intrahepatic left and right portal veins. No evidence of portal vein thrombus. Slow flow is present but flow is towards the liver. After hepatic vein catheterization, the right hepatic vein demonstrates normal patency. After establishing needle access between the right hepatic vein and right portal vein, portal venography demonstrates a large left gastric vein emanating from the portal confluence and supplying esophageal varices as well as 2 additional trunks emanating from the central aspect of the splenic vein and supplying esophageal varices. A TIPS shunt was successfully created. After covered stent deployment, variceal embolization was performed at the level of 3 separate trunks supplying varices with deployment of embolization coils as well as an Amplatzer Vascular Plug. There was successful diminishment in flow to varices. After TIPS creation and dilatation there was excellent flow through the shunt. Initial pressure gradient measurements demonstrated a portosystemic pressure gradient of approximately 7-8 mm Hg. With repeat measurement there did appear to be some additional gradient at the level of the hepatic vein just beyond the stent stented segment near the hepatic vein confluence. Venography did not demonstrate significant stenosis of the vein in this region and this segment of the vein was dilated to 10 mm. It was not felt necessary to place another stent or covered stent to extend the stented segment. IMPRESSION: Successful placement of TIPS shunt between right hepatic vein and right portal vein with  placement of 10 mm diameter Viatorr covered  stent dilated to 10 mm. The left gastric vein and 2 additional trunks supplying prominent esophageal varices were also embolized with embolization coils a vascular plug. Electronically Signed   By: Aletta Edouard M.D.   On: 05/16/2020 13:21   IR Angiogram Selective Each Additional Vessel  Result Date: 05/16/2020 CLINICAL DATA:  Alcoholic liver disease and acute bleeding esophageal varices. Persistent bleeding despite endoscopic variceal banding and critical illness with hypotension and shock requiring blood transfusion, pressor therapy and intubation. The patient has been emergently transferred from [facility] for a TIPS procedure. EXAM: 1. TRANSJUGULAR INTRAHEPATIC PORTOSYSTEMIC SHUNT PLACEMENT 2. ADDITIONAL CATHETERIZATION OF LEFT GASTRIC VEIN AND TWO ADDITIONAL VARICEAL TRUNKS OFF OF PORTAL CONFLUENCE AND SPLENIC VEIN 3. TRANSCATHETER EMBOLIZATION OF VARICES FOR HEMORRHAGE ASSISTANT: ADAM HENN, M.D. MEDICATIONS: As antibiotic prophylaxis, 2 g IV Ancef was ordered pre-procedure and administered intravenously within one hour of incision. ANESTHESIA/SEDATION: General anesthesia-as administered by the Anesthesia department CONTRAST:  100 mL Omnipaque 300 FLUOROSCOPY TIME:  Fluoroscopy Time: 89 minutes and 30 seconds. 3961 mGy. COMPLICATIONS: None immediate. PROCEDURE: Informed written consent was obtained from the patient's wife after a thorough discussion of the procedural risks, benefits and alternatives. All questions were addressed. Maximal Sterile Barrier Technique was utilized including caps, mask, sterile gowns, sterile gloves, sterile drape, hand hygiene and skin antiseptic. A timeout was performed prior to the initiation of the procedure. The right neck and abdominal wall were prepped with chlorhexidine and draped. Ultrasound was performed of the liver. Under direct ultrasound guidance, portal vein access was performed in the right lobe with a 21 gauge  needle. A guidewire was advanced. A 3 French dilator was then advanced over the wire and into the portal vein. Portal venography was performed through the dilator. Ultrasound was used to confirm patency of the right internal jugular vein. Under direct ultrasound guidance, access of the right internal jugular vein was performed with a 21 gauge needle and micropuncture set. Over a guidewire, venous access was dilated and a 10 French angled sheath advanced. The sheath was positioned in the superior aspect of the inferior vena cava. A 5 French catheter was then used to selectively catheterize the right hepatic vein. The catheter was advanced in the right hepatic vein and selective hepatic venography performed. The 10 French sheath was advanced into the right hepatic vein. A Colapinto needle and needle sheath were advanced through the 10 French sheath. The needle was then advanced in the liver parenchyma and used to gain access to the level of the right portal vein utilizing the portal 3 [place] dilator as a target for needle puncture. After gaining access into the right portal venous system, a guidewire was advanced. A catheter was then able to be advanced over the guidewire and into the portal vein via hepatic venous access. Portal venography was performed. Over a guidewire, the intraparenchymal tract between the right hepatic vein and right portal vein was then dilated utilizing 5 mm and 7 mm diameter balloons. This allowed advancement of the 10 French sheath into the portal vein. A marking pigtail catheter was then utilized in performing venography to estimate length of covered stent required for shunt placement. A Gore Viatorr covered stent prosthesis with diameter of 10 mm, covered stent length of 8 cm and uncovered length of 2 cm was chosen for placement. The stent was advanced through the sheath and deployed. After deployment additional venography was performed. The entire prosthesis was dilated to 8 mm. A 5  French catheter was used to selectively  catheterize the left gastric vein. Selective venography was performed. The catheter was further advanced into the vein. Embolization coils were then advanced through the 5 French catheter to the level of varices. The 5 French catheter was used to selectively catheterize an additional variceal trunk off of the splenic vein. Selective venography was performed. A Lantern microcatheter was advanced through the 5 [place] catheter. Multiple Ruby microcoils were than deployed via the microcatheter within varices. An Amplatzer Vascular Plug 4 was deployed through the 5 [place] catheter. A third variceal trunk off of the splenic vein was then catheterized with a 5 French catheter. The Lantern microcatheter was advanced through the 5 [place] catheter. Multiple Ruby microcoils were deployed within varices. Additional venography was performed during embolization to assess flow. Additional venography was performed as well as portosystemic pressure measurements. The TIPS shunt was dilated to 10 mm. Additional pressure measurements were made. The 3 French portal vein dilator was removed and a dressing applied at the abdominal exit site. The 10 French sheath was then removed from the right jugular access site and hemostasis obtained with manual compression. A dressing was applied at the exit site. FINDINGS: After portal venous access portal venography demonstrates widely patent main portal vein and intrahepatic left and right portal veins. No evidence of portal vein thrombus. Slow flow is present but flow is towards the liver. After hepatic vein catheterization, the right hepatic vein demonstrates normal patency. After establishing needle access between the right hepatic vein and right portal vein, portal venography demonstrates a large left gastric vein emanating from the portal confluence and supplying esophageal varices as well as 2 additional trunks emanating from the central aspect of the  splenic vein and supplying esophageal varices. A TIPS shunt was successfully created. After covered stent deployment, variceal embolization was performed at the level of 3 separate trunks supplying varices with deployment of embolization coils as well as an Amplatzer Vascular Plug. There was successful diminishment in flow to varices. After TIPS creation and dilatation there was excellent flow through the shunt. Initial pressure gradient measurements demonstrated a portosystemic pressure gradient of approximately 7-8 mm Hg. With repeat measurement there did appear to be some additional gradient at the level of the hepatic vein just beyond the stent stented segment near the hepatic vein confluence. Venography did not demonstrate significant stenosis of the vein in this region and this segment of the vein was dilated to 10 mm. It was not felt necessary to place another stent or covered stent to extend the stented segment. IMPRESSION: Successful placement of TIPS shunt between right hepatic vein and right portal vein with placement of 10 mm diameter Viatorr covered stent dilated to 10 mm. The left gastric vein and 2 additional trunks supplying prominent esophageal varices were also embolized with embolization coils a vascular plug. Electronically Signed   By: Aletta Edouard M.D.   On: 05/16/2020 13:21   IR Tips  Result Date: 05/16/2020 CLINICAL DATA:  Alcoholic liver disease and acute bleeding esophageal varices. Persistent bleeding despite endoscopic variceal banding and critical illness with hypotension and shock requiring blood transfusion, pressor therapy and intubation. The patient has been emergently transferred from [facility] for a TIPS procedure. EXAM: 1. TRANSJUGULAR INTRAHEPATIC PORTOSYSTEMIC SHUNT PLACEMENT 2. ADDITIONAL CATHETERIZATION OF LEFT GASTRIC VEIN AND TWO ADDITIONAL VARICEAL TRUNKS OFF OF PORTAL CONFLUENCE AND SPLENIC VEIN 3. TRANSCATHETER EMBOLIZATION OF VARICES FOR HEMORRHAGE  ASSISTANT: ADAM HENN, M.D. MEDICATIONS: As antibiotic prophylaxis, 2 g IV Ancef was ordered pre-procedure and administered intravenously within one  hour of incision. ANESTHESIA/SEDATION: General anesthesia-as administered by the Anesthesia department CONTRAST:  100 mL Omnipaque 300 FLUOROSCOPY TIME:  Fluoroscopy Time: 89 minutes and 30 seconds. 3961 mGy. COMPLICATIONS: None immediate. PROCEDURE: Informed written consent was obtained from the patient's wife after a thorough discussion of the procedural risks, benefits and alternatives. All questions were addressed. Maximal Sterile Barrier Technique was utilized including caps, mask, sterile gowns, sterile gloves, sterile drape, hand hygiene and skin antiseptic. A timeout was performed prior to the initiation of the procedure. The right neck and abdominal wall were prepped with chlorhexidine and draped. Ultrasound was performed of the liver. Under direct ultrasound guidance, portal vein access was performed in the right lobe with a 21 gauge needle. A guidewire was advanced. A 3 French dilator was then advanced over the wire and into the portal vein. Portal venography was performed through the dilator. Ultrasound was used to confirm patency of the right internal jugular vein. Under direct ultrasound guidance, access of the right internal jugular vein was performed with a 21 gauge needle and micropuncture set. Over a guidewire, venous access was dilated and a 10 French angled sheath advanced. The sheath was positioned in the superior aspect of the inferior vena cava. A 5 French catheter was then used to selectively catheterize the right hepatic vein. The catheter was advanced in the right hepatic vein and selective hepatic venography performed. The 10 French sheath was advanced into the right hepatic vein. A Colapinto needle and needle sheath were advanced through the 10 French sheath. The needle was then advanced in the liver parenchyma and used to gain access to the  level of the right portal vein utilizing the portal 3 [place] dilator as a target for needle puncture. After gaining access into the right portal venous system, a guidewire was advanced. A catheter was then able to be advanced over the guidewire and into the portal vein via hepatic venous access. Portal venography was performed. Over a guidewire, the intraparenchymal tract between the right hepatic vein and right portal vein was then dilated utilizing 5 mm and 7 mm diameter balloons. This allowed advancement of the 10 French sheath into the portal vein. A marking pigtail catheter was then utilized in performing venography to estimate length of covered stent required for shunt placement. A Gore Viatorr covered stent prosthesis with diameter of 10 mm, covered stent length of 8 cm and uncovered length of 2 cm was chosen for placement. The stent was advanced through the sheath and deployed. After deployment additional venography was performed. The entire prosthesis was dilated to 8 mm. A 5 French catheter was used to selectively catheterize the left gastric vein. Selective venography was performed. The catheter was further advanced into the vein. Embolization coils were then advanced through the 5 French catheter to the level of varices. The 5 French catheter was used to selectively catheterize an additional variceal trunk off of the splenic vein. Selective venography was performed. A Lantern microcatheter was advanced through the 5 [place] catheter. Multiple Ruby microcoils were than deployed via the microcatheter within varices. An Amplatzer Vascular Plug 4 was deployed through the 5 [place] catheter. A third variceal trunk off of the splenic vein was then catheterized with a 5 French catheter. The Lantern microcatheter was advanced through the 5 [place] catheter. Multiple Ruby microcoils were deployed within varices. Additional venography was performed during embolization to assess flow. Additional venography was  performed as well as portosystemic pressure measurements. The TIPS shunt was dilated to 10 mm. Additional  pressure measurements were made. The 3 French portal vein dilator was removed and a dressing applied at the abdominal exit site. The 10 French sheath was then removed from the right jugular access site and hemostasis obtained with manual compression. A dressing was applied at the exit site. FINDINGS: After portal venous access portal venography demonstrates widely patent main portal vein and intrahepatic left and right portal veins. No evidence of portal vein thrombus. Slow flow is present but flow is towards the liver. After hepatic vein catheterization, the right hepatic vein demonstrates normal patency. After establishing needle access between the right hepatic vein and right portal vein, portal venography demonstrates a large left gastric vein emanating from the portal confluence and supplying esophageal varices as well as 2 additional trunks emanating from the central aspect of the splenic vein and supplying esophageal varices. A TIPS shunt was successfully created. After covered stent deployment, variceal embolization was performed at the level of 3 separate trunks supplying varices with deployment of embolization coils as well as an Amplatzer Vascular Plug. There was successful diminishment in flow to varices. After TIPS creation and dilatation there was excellent flow through the shunt. Initial pressure gradient measurements demonstrated a portosystemic pressure gradient of approximately 7-8 mm Hg. With repeat measurement there did appear to be some additional gradient at the level of the hepatic vein just beyond the stent stented segment near the hepatic vein confluence. Venography did not demonstrate significant stenosis of the vein in this region and this segment of the vein was dilated to 10 mm. It was not felt necessary to place another stent or covered stent to extend the stented segment.  IMPRESSION: Successful placement of TIPS shunt between right hepatic vein and right portal vein with placement of 10 mm diameter Viatorr covered stent dilated to 10 mm. The left gastric vein and 2 additional trunks supplying prominent esophageal varices were also embolized with embolization coils a vascular plug. Electronically Signed   By: Aletta Edouard M.D.   On: 05/16/2020 13:21   DG CHEST PORT 1 VIEW  Result Date: 05/16/2020 CLINICAL DATA:  Hypoxia EXAM: PORTABLE CHEST 1 VIEW COMPARISON:  May 16, 2020 study obtained earlier in the day FINDINGS: Endotracheal tube tip is 1.8 cm above the carina. Central catheter tip is in the superior vena cava. Nasogastric tube tip and side port are below the diaphragm. No pneumothorax. There is bibasilar atelectasis, more on the left than on the right. Lungs elsewhere are clear. Heart is upper normal in size with pulmonary vascularity normal. No adenopathy. No bone lesions. IMPRESSION: Tube and catheter positions as described without pneumothorax. Bibasilar atelectasis, slightly more on the left than on the right. Lungs elsewhere clear. Stable cardiac silhouette. Electronically Signed   By: Lowella Grip III M.D.   On: 05/16/2020 10:54   DG Chest Port 1 View  Result Date: 05/16/2020 CLINICAL DATA:  Endotracheally intubated. EXAM: PORTABLE CHEST 1 VIEW COMPARISON:  Radiograph yesterday. FINDINGS: Endotracheal tube tip is at the level of the clavicular heads 5.3 cm from the carina. Enteric tube in place with tip below the diaphragm not included in the field of view. Right subclavian central line unchanged. Stable heart size and mediastinal contours. Mild bibasilar atelectasis. No pneumothorax or pleural effusion. IMPRESSION: 1. Endotracheal tube tip at the level of the clavicular heads 5.3 cm from the carina. Enteric tube in place with tip below the diaphragm not included in the field of view. 2. Right central line tip in the SVC. 3. Mild bibasilar  atelectasis.  Electronically Signed   By: Keith Rake M.D.   On: 05/16/2020 00:33   DG Abd Portable 1V  Result Date: 05/16/2020 CLINICAL DATA:  OG tube placement EXAM: PORTABLE ABDOMEN - 1 VIEW COMPARISON:  CT 05/14/2020 FINDINGS: Transesophageal tube tip terminates in the right upper quadrant likely at the level of the gastric antrum/duodenal bulb with side port beyond the GE junction. Upper abdominal vascular stent and embolization coils are noted. Telemetry leads overlie the upper abdomen as well. Redemonstration of the diffuse air distended and clustered small bowel in the mid abdomen compatible with a high-grade obstruction seen on comparison CT. No acute osseous abnormality. Bilateral total hip arthroplasties are noted. IMPRESSION: 1. Transesophageal tube tip terminates in the right upper quadrant likely at the level of the gastric antrum/duodenal bulb. Side port beyond the GE junction. 2. Persistent high-grade small bowel obstruction seen on comparison CT. Electronically Signed   By: Lovena Le M.D.   On: 05/16/2020 01:29   IR EMBO ART  VEN HEMORR LYMPH EXTRAV  INC GUIDE ROADMAPPING  Result Date: 05/16/2020 CLINICAL DATA:  Alcoholic liver disease and acute bleeding esophageal varices. Persistent bleeding despite endoscopic variceal banding and critical illness with hypotension and shock requiring blood transfusion, pressor therapy and intubation. The patient has been emergently transferred from [facility] for a TIPS procedure. EXAM: 1. TRANSJUGULAR INTRAHEPATIC PORTOSYSTEMIC SHUNT PLACEMENT 2. ADDITIONAL CATHETERIZATION OF LEFT GASTRIC VEIN AND TWO ADDITIONAL VARICEAL TRUNKS OFF OF PORTAL CONFLUENCE AND SPLENIC VEIN 3. TRANSCATHETER EMBOLIZATION OF VARICES FOR HEMORRHAGE ASSISTANT: ADAM HENN, M.D. MEDICATIONS: As antibiotic prophylaxis, 2 g IV Ancef was ordered pre-procedure and administered intravenously within one hour of incision. ANESTHESIA/SEDATION: General anesthesia-as administered by the  Anesthesia department CONTRAST:  100 mL Omnipaque 300 FLUOROSCOPY TIME:  Fluoroscopy Time: 89 minutes and 30 seconds. 3961 mGy. COMPLICATIONS: None immediate. PROCEDURE: Informed written consent was obtained from the patient's wife after a thorough discussion of the procedural risks, benefits and alternatives. All questions were addressed. Maximal Sterile Barrier Technique was utilized including caps, mask, sterile gowns, sterile gloves, sterile drape, hand hygiene and skin antiseptic. A timeout was performed prior to the initiation of the procedure. The right neck and abdominal wall were prepped with chlorhexidine and draped. Ultrasound was performed of the liver. Under direct ultrasound guidance, portal vein access was performed in the right lobe with a 21 gauge needle. A guidewire was advanced. A 3 French dilator was then advanced over the wire and into the portal vein. Portal venography was performed through the dilator. Ultrasound was used to confirm patency of the right internal jugular vein. Under direct ultrasound guidance, access of the right internal jugular vein was performed with a 21 gauge needle and micropuncture set. Over a guidewire, venous access was dilated and a 10 French angled sheath advanced. The sheath was positioned in the superior aspect of the inferior vena cava. A 5 French catheter was then used to selectively catheterize the right hepatic vein. The catheter was advanced in the right hepatic vein and selective hepatic venography performed. The 10 French sheath was advanced into the right hepatic vein. A Colapinto needle and needle sheath were advanced through the 10 French sheath. The needle was then advanced in the liver parenchyma and used to gain access to the level of the right portal vein utilizing the portal 3 [place] dilator as a target for needle puncture. After gaining access into the right portal venous system, a guidewire was advanced. A catheter was then able to be  advanced over  the guidewire and into the portal vein via hepatic venous access. Portal venography was performed. Over a guidewire, the intraparenchymal tract between the right hepatic vein and right portal vein was then dilated utilizing 5 mm and 7 mm diameter balloons. This allowed advancement of the 10 French sheath into the portal vein. A marking pigtail catheter was then utilized in performing venography to estimate length of covered stent required for shunt placement. A Gore Viatorr covered stent prosthesis with diameter of 10 mm, covered stent length of 8 cm and uncovered length of 2 cm was chosen for placement. The stent was advanced through the sheath and deployed. After deployment additional venography was performed. The entire prosthesis was dilated to 8 mm. A 5 French catheter was used to selectively catheterize the left gastric vein. Selective venography was performed. The catheter was further advanced into the vein. Embolization coils were then advanced through the 5 French catheter to the level of varices. The 5 French catheter was used to selectively catheterize an additional variceal trunk off of the splenic vein. Selective venography was performed. A Lantern microcatheter was advanced through the 5 [place] catheter. Multiple Ruby microcoils were than deployed via the microcatheter within varices. An Amplatzer Vascular Plug 4 was deployed through the 5 [place] catheter. A third variceal trunk off of the splenic vein was then catheterized with a 5 French catheter. The Lantern microcatheter was advanced through the 5 [place] catheter. Multiple Ruby microcoils were deployed within varices. Additional venography was performed during embolization to assess flow. Additional venography was performed as well as portosystemic pressure measurements. The TIPS shunt was dilated to 10 mm. Additional pressure measurements were made. The 3 French portal vein dilator was removed and a dressing applied at the abdominal exit site. The  10 French sheath was then removed from the right jugular access site and hemostasis obtained with manual compression. A dressing was applied at the exit site. FINDINGS: After portal venous access portal venography demonstrates widely patent main portal vein and intrahepatic left and right portal veins. No evidence of portal vein thrombus. Slow flow is present but flow is towards the liver. After hepatic vein catheterization, the right hepatic vein demonstrates normal patency. After establishing needle access between the right hepatic vein and right portal vein, portal venography demonstrates a large left gastric vein emanating from the portal confluence and supplying esophageal varices as well as 2 additional trunks emanating from the central aspect of the splenic vein and supplying esophageal varices. A TIPS shunt was successfully created. After covered stent deployment, variceal embolization was performed at the level of 3 separate trunks supplying varices with deployment of embolization coils as well as an Amplatzer Vascular Plug. There was successful diminishment in flow to varices. After TIPS creation and dilatation there was excellent flow through the shunt. Initial pressure gradient measurements demonstrated a portosystemic pressure gradient of approximately 7-8 mm Hg. With repeat measurement there did appear to be some additional gradient at the level of the hepatic vein just beyond the stent stented segment near the hepatic vein confluence. Venography did not demonstrate significant stenosis of the vein in this region and this segment of the vein was dilated to 10 mm. It was not felt necessary to place another stent or covered stent to extend the stented segment. IMPRESSION: Successful placement of TIPS shunt between right hepatic vein and right portal vein with placement of 10 mm diameter Viatorr covered stent dilated to 10 mm. The left gastric vein and 2 additional  trunks supplying prominent esophageal  varices were also embolized with embolization coils a vascular plug. Electronically Signed   By: Aletta Edouard M.D.   On: 05/16/2020 13:21    Impression: Variceal bleeding s/p TIPS 05/16/20 -Hemoglobin 8.9 today, improved from 7.9 yesterday -Hypotensive but with regular heart rate  Decompensated cirrhosis, most likely from alcohol use.  MELD score of 35 as of 05/16/2020.  Hepatic discriminant function 68.6 as of 05/16/2020 -T bili 4.7/AST 109/ALT 32/ALP 74 -INR 2.5 -Ammonia 154  Hepatorenal syndrome versus AKI due to blood loss: BUN 45/Cr 3.50  Plan: Initiate lactulose 30 g 4 times daily via OG tube.  Continue Xifaxan twice daily.  Discontinue MiraLAX.  Collect urine sodium.  If <10, concerning for hepatorenal syndrome.  Albumin 25 g q6 hours x 4 doses.  Continue octreotide and Protonix infusions.  Continue to monitor H&H with transfusion as needed to maintain hemoglobin greater than 7.  [facility] will follow.   LOS: 1 day   Salley Slaughter  PA-C 05/16/2020, 5:20 PM  Contact #  [phone]

## 2020-05-16 NOTE — Progress Notes (Addendum)
NAME:  Jimmy Gonzales, MRN:  [id number], DOB:  [birth year], LOS: 1 ADMISSION DATE:  05/15/2020, CONSULTATION DATE: May 15, 2020 REFERRING MD: Dr. Kathlene Cote, CHIEF COMPLAINT: Vomiting blood  Brief History   49 year old male with a past medical history significant for heavy alcohol abuse presented to [facility] with hematemesis, developed hemorrhagic shock, GI unable to control bleeding with endoscopy.  Transferred to [facility] for emergent TIPS placement.  History of present illness   This is a 49 year old male who has a past medical history significant for drinking 1/2 gallon of hard liquor a day who presented to [facility] with a chief complaint of hematemesis on August 18.  Apparently he had developed abdominal pain and nausea and vomiting which was initially nonbilious nonbloody for 2 days prior to admission.  He was on the way to see his physician on August 18 when he suddenly developed hematemesis.  In the emergency department he was noted to have a large ventral hernia which was incarcerated based on CT scanning.  This was easily reduced and he was admitted to the intensive care unit on an octreotide infusion.  However, hematemesis and hematochezia persisted.  He required intubation, central line placement, vasopressor infusion, multiple blood transfusions, and emergent endoscopy.  Unfortunately endoscopy was incomplete as the patient had significant bleeding.  7 bands were placed but esophageal variceal bleeding was unable to be controlled.  Reportedly Covid negative. He was transferred to [facility] for emergent TIPS. 2 units PRBC given during the procedure. PCCM accepted the patient for ICU admission.   Past Medical History  Alcohol abuse  Significant Hospital Events   8/ 18 admitted [facility] 8/ 19 emergent endoscopy, banding of variceal lesions in esophagus, transferred to [facility] for emergent TIPS procedure 8/ 20 continued blood  product administration   Consults:  Interventional radiology  Procedures:  August 19 endotracheal tube August 19 right IJ central venous line August 19 arterial line   Significant Diagnostic Tests:  August 18 CT abdomen > high grade SBO with transition at small umbilical hernia containing a portion on the mid jejunum. Cirrhosis with portal venous hypertension and marked abdominal varices.   Micro Data:  August 19 SARS-CoV-2> negative Hepatitis A IgM ab neg Hepatitis Bs antigen negative Hepatitis B core IGM negative Hepatitis C antibody 0.1   Antimicrobials:  August 19 Zosyn>  Interim history/subjective:  Sedation was turned off due to hypotension early this morning. Patient was slow to arouse, at 0730 became very agitated, hypertensive, pulling at tube.  Precedex gtt and fentanyl gtts resumed with appropriate response  Subsequently given 2mg  versed and became hypotensive SBP 80s, NE uptitrated   Ordering 1 PRBC for hgb <8  Frank blood from OGT, ETT, dark blood from rectal tube   Objective   Blood pressure 98/65, pulse 77, temperature 99 F (37.2 C), temperature source Axillary, resp. rate 18, height 6' (1.829 m), weight 102.1 kg, SpO2 100 %.    Vent Mode: PRVC FiO2 (%):  [60 %] 60 % Set Rate:  [15 bmp] 15 bmp Vt Set:  [620 mL] 620 mL PEEP:  [5 cmH20] 5 cmH20 Pressure Support:  [5 cmH20] 5 cmH20 Plateau Pressure:  [17 cmH20] 17 cmH20   Intake/Output Summary (Last 24 hours) at 05/16/2020 0733 Last data filed at 05/16/2020 0600 Gross per 24 hour  Intake 3415.74 ml  Output 1050 ml  Net 2365.74 ml   Filed Weights   05/16/20 0100  Weight: 102.1 kg  Examination: General: Middle aged chronically and critically ill appearing M  HENT: Icteric sclera. Frank blood from ETT. ETT OGT secure. Trachea midline Lungs: Clear Cardiovascular: RRR s1s2 no rgm cap refill < 3  Abdomen: Soft round. Umbilical hernia. Flexi seal collecting dark blood  Extremities: BUE soft  restraints no obvious joint deformity no cyanosis or clubbing  Neuro: Sedated but agitated. Wiggles toes to command. Moving BUE BLE spontaneously  Skin: c/d/w mild jaundice   Resolved Hospital Problem list     Assessment & Plan:   Acute respiratory failure with hypoxemia due to inability to protect airway -- upper GIB -in interval ETT position has changed from 21 at lip to 17 at lip  P - Full mechanical vent support - VAP prevention - Trend CXR  - When medically appropriate WUA/SBT  - Advance ETT, follow up CXR   Metabolic encephalopathy, multifactorial  EtOH abuse with risk for dts  -no hx of seizure with prior withdrawals however use was lighter  Likely hepatic encephalopathy- hyperammonemia 154 after TIPS P -Propofol, fentanyl, precedex -- will need to adjust throughout day to find appropriate balance. Hemodynamically does not tolerate BZD pushes well, hoping that low dose prop will be helpful with sedation level + gaba in setting of etoh abuse.  -Thiamine, folate -Seizure precautions  -Risk of hepatic encephalopathy increased after TIPS; trend ammonia and likely will need lactulose enema (NPO with SBO) despite GIB   Hemorrhagic shock in setting of GIB, coagulopathy  -s/p 2 PRBC, 1 FFP, Vit K, albumin  -8/20 blood in ETT OGT BMS  P - CBC q6 + coags BID  -Hgb goal > 8 -- ordering 1 PRBC 8/20; anticipate may need additional product throughout day   - NE concomitantly for MAP > 65 - LR bolus  - additional albumin   Acute upper GI Bleed due to esophageal variceal bleeding - s/p emergent TIPS 8/19 P - Octreotide infusion - PPI infusion, then protonix 40mg  BID - Zosyn  - prod product resuc as above   Decompensated alcoholic cirrhosis with portal hypertension: S/p TIPS 8/29 Coagulopathy, hyperbilirubinemia  S/p FFB, Vit K 8/20 overnight Acute alcoholic hepatitis.  MELD 35 P - Trend coags BID  - IR following s/p TIPS   Leukocytosis, possible sepsis Elevated lactic  Acid P -trend  CBC fever curve -continue Zosyn   Acute kidney injury:  -Likely prerenal injury in setting of hemorrhagic shock, certainly at risk HRS however  -Cr 3.5 8/20 P - Trend renal indices, UOP and foley  - Continue NE, minimum MAP 65 (prefer MAP 70)   Hypocalcemia Hypomagnesemia, improving s/p replacement  Hypokalemia Hyperphosphatemia, improving  P -giving additional Calcium and potassium  -trend iCal; anticipate ongoing need for replacement with ongoing blood product resusc  -trend BMP, mag phos   Incarcerated bowel, reduced at [employer] SBO P - NPO, OGT to LIWS  - Follow serially; if not improving consider CCS consult    Best practice:  Diet: NPO Pain/Anxiety/Delirium protocol (if indicated): As above VAP protocol (if indicated): Yes DVT prophylaxis: SCD GI prophylaxis: Protonix  Glucose control: Monitor Mobility: Bedrest Code Status: Full Family Communication: Long discussion with wife at bedside 8/20. We discussed clinical course and concerns moving forward including possibility that present illness may not improve. Will continue to follow up throughout day as able as wife is understandably very stunned/overwhelmed by present illness (EtOH abuse known to her, but no previously dx liver disease) Disposition: ICU  Labs   CBC: Recent Labs  Lab 05/15/20 2241 05/16/20 0019 05/16/20 0026 05/16/20 0346 05/16/20 0610  WBC  --  23.8*  --   --  23.8*  HGB 7.5* 9.1* 9.9* 8.9* 7.9*  7.9*  HCT 22.0* 27.4* 29.0* 26.7* 23.6*  23.4*  MCV  --  91.6  --   --  89.1  PLT  --  180  188  --   --  741    Basic Metabolic Panel: Recent Labs  Lab 05/15/20 1902 05/15/20 2139 05/15/20 2241 05/16/20 0019 05/16/20 0026  NA 139 138 136 134* 139  K 3.0* 3.3* 3.3* 3.1* 2.8*  CL  --   --   --  99  --   CO2  --   --   --  19*  --   GLUCOSE  --   --   --  125*  --   BUN  --   --   --  40*  --   CREATININE  --   --   --  3.81*  --   CALCIUM  --   --   --   6.4*  --   MG  --   --   --  1.1*  --   PHOS  --   --   --  7.7*  --    GFR: Estimated Creatinine Clearance: 29.6 mL/min (A) (by C-G formula based on SCr of 3.81 mg/dL (H)). Recent Labs  Lab 05/16/20 0018 05/16/20 0019 05/16/20 0610  WBC  --  23.8* 23.8*  LATICACIDVEN 5.6*  --  3.0*    Liver Function Tests: Recent Labs  Lab 05/16/20 0019  AST 109*  ALT 32  ALKPHOS 74  BILITOT 4.7*  PROT 5.2*  ALBUMIN 1.9*   No results for input(s): LIPASE, AMYLASE in the last 168 hours. Recent Labs  Lab 05/16/20 0208  AMMONIA 154*    ABG    Component Value Date/Time   PHART 7.326 (L) 05/16/2020 0026   PCO2ART 38.8 05/16/2020 0026   PO2ART 112 (H) 05/16/2020 0026   HCO3 20.2 05/16/2020 0026   TCO2 21 (L) 05/16/2020 0026   ACIDBASEDEF 5.0 (H) 05/16/2020 0026   O2SAT 98.0 05/16/2020 0026     Coagulation Profile: Recent Labs  Lab 05/16/20 0019  INR 2.5*  2.5*    Cardiac Enzymes: No results for input(s): CKTOTAL, CKMB, CKMBINDEX, TROPONINI in the last 168 hours.  HbA1C: No results found for: HGBA1C  CBG: No results for input(s): GLUCAP in the last 168 hours.  CRITICAL CARE Performed by: Cristal Generous   Total critical care time: 123 minutes  Critical care time was exclusive of separately billable procedures and treating other patients. Critical care was necessary to treat or prevent imminent or life-threatening deterioration.  Critical care was time spent personally by me on the following activities: development of treatment plan with patient and/or surrogate as well as nursing, discussions with consultants, evaluation of patient's response to treatment, examination of patient, obtaining history from patient or surrogate, ordering and performing treatments and interventions, ordering and review of laboratory studies, ordering and review of radiographic studies, pulse oximetry and re-evaluation of patient's condition.  Eliseo Gum MSN, AGACNP-BC [place] [phone] If no answer, [phone] 05/16/2020, 7:33 AM   This is a 49 year old past medical history of heavy alcohol abuse presents in hemorrhagic shock secondary to bleeding varices, GI unable to control with endoscopic procedures and transferred to [facility] for emergent TIPS placement.  Still with ongoing blood  loss from his NG tube.  This morning patient was not having adequate expiratory return on ventilator.  Patient was sedated video laryngoscopic was inserted into the airway to find the endotracheal tube above the vocal cords balloon inflated however Murphy's and distal tip through the vocal cords.  Decision was made for extubation and reintubation with a new tube.  Please see separate procedure note.  Of note CT scan imaging from [place] does have evidence of an obstruction.  Patient's abdomen abdominal exam reveals distention.  BP (!) 111/52   Pulse 71   Temp 97.6 F (36.4 C) (Axillary)   Resp 16   Ht 6' (1.829 m)   Wt 102.1 kg   SpO2 100%   BMI 30.52 kg/m   General: Appears older than stated age intubated on mechanical life support. Heart: Regular rhythm S1-S2 HEENT: Sclera icterus Lungs: Bilateral mechanically ventilated breath sounds Abdomen: Distended to palpation.  Labs: Reviewed Chest x-ray: Appropriate endotracheal tube placement. The patient's images have been independently reviewed by me.    Assessment: Acute hypoxemic respiratory failure requiring intubation mechanical ventilation Acute metabolic encephalopathy Likely acute hepatic encephalopathy secondary to hyperammonemia History of alcohol abuse Hemorrhagic shock in the setting of variceal bleeding and coagulopathy. Acute upper GI bleed secondary to above Decompensated alcoholic cirrhosis and portal hypertension status post urgent TIPS Acute alcoholic hepatitis, meld 35 Lactic acidosis due to inability of lactate clearance from liver dysfunction and sepsis secondary to  above AKI secondary to above Multiple electrolyte abnormalities Possible incarcerated bowel versus SBO seen on CT scan [facility]   Plan: Endotracheal tube exchange due to malfunctioning endotracheal tube plan for today please see separate procedure note. Rectal lactulose Rifaximin Continue Zosyn Nephrology consultation, no urgent need for HD at this time but I suspect will need renal replacement therapy at some point. Overall prognosis is extremely poor. Continue PPI and octreotide Continue thiamine plus folate  Discussed with [facility], [facility].  Both agree patient not candidate for surgical intervention due to progressive multiorgan failure and clinically unstable at this time.  This patient is critically ill with multiple organ system failure; which, requires frequent high complexity decision making, assessment, support, evaluation, and titration of therapies. This was completed through the application of advanced monitoring technologies and extensive interpretation of multiple databases. During this encounter critical care time was devoted to patient care services described in this note for 33 minutes.  Garner Nash, DO [facility] 05/16/2020 3:51 PM

## 2020-05-16 NOTE — Progress Notes (Addendum)
[place] Progress Note Patient Name: Mali R Cadman DOB: [birth year] MRN: [id number]   Date of Service  05/16/2020  HPI/Events of Note  Abnormal electrolytes. Lactic acid 5.6.  eICU Interventions  K+, Mg++ and Ca++ replaced. Albumin 5 % 250 ml iv x 1.        Maryella Abood U Jayla Mackie 05/16/2020, 2:02 AM

## 2020-05-16 NOTE — Progress Notes (Signed)
Supervising Physician: Jacqulynn Cadet  Patient Status:  [facility] - In-pt  Chief Complaint: GI bleed  Subjective: Patient transferred from [facility] for acute hematemesis requiring intubation, central line placement, vasopressor infusion, multiple blood transfusions, and endoscopy with 7 bands placed and ongoing significant bleeding.  S/p TIPS, additional coil and occluder device embolization of variceal trunks supplying the esophageal varices.   Assessed this AM.  Remains intubated, agitated when not on sedation. Not following commands. Abdomen distended.  Procedure sites intact.  Flexiseal with dark, bloody output NGT with minimal bloody output.   Allergies: Patient has no known allergies.  Medications: Prior to Admission medications   Not on File     Vital Signs: BP 98/62   Pulse 71   Temp 98.2 F (36.8 C) (Axillary)   Resp 15   Ht 6' (1.829 m)   Wt 225 lb (102.1 kg)   SpO2 100%   BMI 30.52 kg/m   Physical Exam  NAD, alert Neck: R IJ procedure site intact.  Abdomen: distended.  Hernia reduced. RUQ procedure site intact. Flexiseal with dark, bloody output.  Imaging: IR Angiogram Selective Each Additional Vessel  Result Date: 05/16/2020 CLINICAL DATA:  Alcoholic liver disease and acute bleeding esophageal varices. Persistent bleeding despite endoscopic variceal banding and critical illness with hypotension and shock requiring blood transfusion, pressor therapy and intubation. The patient has been emergently transferred from [facility] for a TIPS procedure. EXAM: 1. TRANSJUGULAR INTRAHEPATIC PORTOSYSTEMIC SHUNT PLACEMENT 2. ADDITIONAL CATHETERIZATION OF LEFT GASTRIC VEIN AND TWO ADDITIONAL VARICEAL TRUNKS OFF OF PORTAL CONFLUENCE AND SPLENIC VEIN 3. TRANSCATHETER EMBOLIZATION OF VARICES FOR HEMORRHAGE ASSISTANT: ADAM HENN, M.D. MEDICATIONS: As antibiotic prophylaxis, 2 g IV Ancef was ordered pre-procedure and administered intravenously within one hour of  incision. ANESTHESIA/SEDATION: General anesthesia-as administered by the Anesthesia department CONTRAST:  100 mL Omnipaque 300 FLUOROSCOPY TIME:  Fluoroscopy Time: 89 minutes and 30 seconds. 3961 mGy. COMPLICATIONS: None immediate. PROCEDURE: Informed written consent was obtained from the patient's wife after a thorough discussion of the procedural risks, benefits and alternatives. All questions were addressed. Maximal Sterile Barrier Technique was utilized including caps, mask, sterile gowns, sterile gloves, sterile drape, hand hygiene and skin antiseptic. A timeout was performed prior to the initiation of the procedure. The right neck and abdominal wall were prepped with chlorhexidine and draped. Ultrasound was performed of the liver. Under direct ultrasound guidance, portal vein access was performed in the right lobe with a 21 gauge needle. A guidewire was advanced. A 3 French dilator was then advanced over the wire and into the portal vein. Portal venography was performed through the dilator. Ultrasound was used to confirm patency of the right internal jugular vein. Under direct ultrasound guidance, access of the right internal jugular vein was performed with a 21 gauge needle and micropuncture set. Over a guidewire, venous access was dilated and a 10 French angled sheath advanced. The sheath was positioned in the superior aspect of the inferior vena cava. A 5 French catheter was then used to selectively catheterize the right hepatic vein. The catheter was advanced in the right hepatic vein and selective hepatic venography performed. The 10 French sheath was advanced into the right hepatic vein. A Colapinto needle and needle sheath were advanced through the 10 French sheath. The needle was then advanced in the liver parenchyma and used to gain access to the level of the right portal vein utilizing the portal 3 [place] dilator as a target for needle puncture. After gaining access into the  right portal venous  system, a guidewire was advanced. A catheter was then able to be advanced over the guidewire and into the portal vein via hepatic venous access. Portal venography was performed. Over a guidewire, the intraparenchymal tract between the right hepatic vein and right portal vein was then dilated utilizing 5 mm and 7 mm diameter balloons. This allowed advancement of the 10 French sheath into the portal vein. A marking pigtail catheter was then utilized in performing venography to estimate length of covered stent required for shunt placement. A Gore Viatorr covered stent prosthesis with diameter of 10 mm, covered stent length of 8 cm and uncovered length of 2 cm was chosen for placement. The stent was advanced through the sheath and deployed. After deployment additional venography was performed. The entire prosthesis was dilated to 8 mm. A 5 French catheter was used to selectively catheterize the left gastric vein. Selective venography was performed. The catheter was further advanced into the vein. Embolization coils were then advanced through the 5 French catheter to the level of varices. The 5 French catheter was used to selectively catheterize an additional variceal trunk off of the splenic vein. Selective venography was performed. A Lantern microcatheter was advanced through the 5 [place] catheter. Multiple Ruby microcoils were than deployed via the microcatheter within varices. An Amplatzer Vascular Plug 4 was deployed through the 5 [place] catheter. A third variceal trunk off of the splenic vein was then catheterized with a 5 French catheter. The Lantern microcatheter was advanced through the 5 [place] catheter. Multiple Ruby microcoils were deployed within varices. Additional venography was performed during embolization to assess flow. Additional venography was performed as well as portosystemic pressure measurements. The TIPS shunt was dilated to 10 mm. Additional pressure measurements were made. The 3 French portal  vein dilator was removed and a dressing applied at the abdominal exit site. The 10 French sheath was then removed from the right jugular access site and hemostasis obtained with manual compression. A dressing was applied at the exit site. FINDINGS: After portal venous access portal venography demonstrates widely patent main portal vein and intrahepatic left and right portal veins. No evidence of portal vein thrombus. Slow flow is present but flow is towards the liver. After hepatic vein catheterization, the right hepatic vein demonstrates normal patency. After establishing needle access between the right hepatic vein and right portal vein, portal venography demonstrates a large left gastric vein emanating from the portal confluence and supplying esophageal varices as well as 2 additional trunks emanating from the central aspect of the splenic vein and supplying esophageal varices. A TIPS shunt was successfully created. After covered stent deployment, variceal embolization was performed at the level of 3 separate trunks supplying varices with deployment of embolization coils as well as an Amplatzer Vascular Plug. There was successful diminishment in flow to varices. After TIPS creation and dilatation there was excellent flow through the shunt. Initial pressure gradient measurements demonstrated a portosystemic pressure gradient of approximately 7-8 mm Hg. With repeat measurement there did appear to be some additional gradient at the level of the hepatic vein just beyond the stent stented segment near the hepatic vein confluence. Venography did not demonstrate significant stenosis of the vein in this region and this segment of the vein was dilated to 10 mm. It was not felt necessary to place another stent or covered stent to extend the stented segment. IMPRESSION: Successful placement of TIPS shunt between right hepatic vein and right portal vein with placement of 10 mm  diameter Viatorr covered stent dilated to 10 mm.  The left gastric vein and 2 additional trunks supplying prominent esophageal varices were also embolized with embolization coils a vascular plug. Electronically Signed   By: Aletta Edouard M.D.   On: 05/16/2020 13:21   IR Angiogram Selective Each Additional Vessel  Result Date: 05/16/2020 CLINICAL DATA:  Alcoholic liver disease and acute bleeding esophageal varices. Persistent bleeding despite endoscopic variceal banding and critical illness with hypotension and shock requiring blood transfusion, pressor therapy and intubation. The patient has been emergently transferred from [facility] for a TIPS procedure. EXAM: 1. TRANSJUGULAR INTRAHEPATIC PORTOSYSTEMIC SHUNT PLACEMENT 2. ADDITIONAL CATHETERIZATION OF LEFT GASTRIC VEIN AND TWO ADDITIONAL VARICEAL TRUNKS OFF OF PORTAL CONFLUENCE AND SPLENIC VEIN 3. TRANSCATHETER EMBOLIZATION OF VARICES FOR HEMORRHAGE ASSISTANT: ADAM HENN, M.D. MEDICATIONS: As antibiotic prophylaxis, 2 g IV Ancef was ordered pre-procedure and administered intravenously within one hour of incision. ANESTHESIA/SEDATION: General anesthesia-as administered by the Anesthesia department CONTRAST:  100 mL Omnipaque 300 FLUOROSCOPY TIME:  Fluoroscopy Time: 89 minutes and 30 seconds. 3961 mGy. COMPLICATIONS: None immediate. PROCEDURE: Informed written consent was obtained from the patient's wife after a thorough discussion of the procedural risks, benefits and alternatives. All questions were addressed. Maximal Sterile Barrier Technique was utilized including caps, mask, sterile gowns, sterile gloves, sterile drape, hand hygiene and skin antiseptic. A timeout was performed prior to the initiation of the procedure. The right neck and abdominal wall were prepped with chlorhexidine and draped. Ultrasound was performed of the liver. Under direct ultrasound guidance, portal vein access was performed in the right lobe with a 21 gauge needle. A guidewire was advanced. A 3 French dilator was then  advanced over the wire and into the portal vein. Portal venography was performed through the dilator. Ultrasound was used to confirm patency of the right internal jugular vein. Under direct ultrasound guidance, access of the right internal jugular vein was performed with a 21 gauge needle and micropuncture set. Over a guidewire, venous access was dilated and a 10 French angled sheath advanced. The sheath was positioned in the superior aspect of the inferior vena cava. A 5 French catheter was then used to selectively catheterize the right hepatic vein. The catheter was advanced in the right hepatic vein and selective hepatic venography performed. The 10 French sheath was advanced into the right hepatic vein. A Colapinto needle and needle sheath were advanced through the 10 French sheath. The needle was then advanced in the liver parenchyma and used to gain access to the level of the right portal vein utilizing the portal 3 [place] dilator as a target for needle puncture. After gaining access into the right portal venous system, a guidewire was advanced. A catheter was then able to be advanced over the guidewire and into the portal vein via hepatic venous access. Portal venography was performed. Over a guidewire, the intraparenchymal tract between the right hepatic vein and right portal vein was then dilated utilizing 5 mm and 7 mm diameter balloons. This allowed advancement of the 10 French sheath into the portal vein. A marking pigtail catheter was then utilized in performing venography to estimate length of covered stent required for shunt placement. A Gore Viatorr covered stent prosthesis with diameter of 10 mm, covered stent length of 8 cm and uncovered length of 2 cm was chosen for placement. The stent was advanced through the sheath and deployed. After deployment additional venography was performed. The entire prosthesis was dilated to 8 mm. A 5 French catheter was  used to selectively catheterize the left  gastric vein. Selective venography was performed. The catheter was further advanced into the vein. Embolization coils were then advanced through the 5 French catheter to the level of varices. The 5 French catheter was used to selectively catheterize an additional variceal trunk off of the splenic vein. Selective venography was performed. A Lantern microcatheter was advanced through the 5 [place] catheter. Multiple Ruby microcoils were than deployed via the microcatheter within varices. An Amplatzer Vascular Plug 4 was deployed through the 5 [place] catheter. A third variceal trunk off of the splenic vein was then catheterized with a 5 French catheter. The Lantern microcatheter was advanced through the 5 [place] catheter. Multiple Ruby microcoils were deployed within varices. Additional venography was performed during embolization to assess flow. Additional venography was performed as well as portosystemic pressure measurements. The TIPS shunt was dilated to 10 mm. Additional pressure measurements were made. The 3 French portal vein dilator was removed and a dressing applied at the abdominal exit site. The 10 French sheath was then removed from the right jugular access site and hemostasis obtained with manual compression. A dressing was applied at the exit site. FINDINGS: After portal venous access portal venography demonstrates widely patent main portal vein and intrahepatic left and right portal veins. No evidence of portal vein thrombus. Slow flow is present but flow is towards the liver. After hepatic vein catheterization, the right hepatic vein demonstrates normal patency. After establishing needle access between the right hepatic vein and right portal vein, portal venography demonstrates a large left gastric vein emanating from the portal confluence and supplying esophageal varices as well as 2 additional trunks emanating from the central aspect of the splenic vein and supplying esophageal varices. A TIPS shunt  was successfully created. After covered stent deployment, variceal embolization was performed at the level of 3 separate trunks supplying varices with deployment of embolization coils as well as an Amplatzer Vascular Plug. There was successful diminishment in flow to varices. After TIPS creation and dilatation there was excellent flow through the shunt. Initial pressure gradient measurements demonstrated a portosystemic pressure gradient of approximately 7-8 mm Hg. With repeat measurement there did appear to be some additional gradient at the level of the hepatic vein just beyond the stent stented segment near the hepatic vein confluence. Venography did not demonstrate significant stenosis of the vein in this region and this segment of the vein was dilated to 10 mm. It was not felt necessary to place another stent or covered stent to extend the stented segment. IMPRESSION: Successful placement of TIPS shunt between right hepatic vein and right portal vein with placement of 10 mm diameter Viatorr covered stent dilated to 10 mm. The left gastric vein and 2 additional trunks supplying prominent esophageal varices were also embolized with embolization coils a vascular plug. Electronically Signed   By: Aletta Edouard M.D.   On: 05/16/2020 13:21   IR Angiogram Selective Each Additional Vessel  Result Date: 05/16/2020 CLINICAL DATA:  Alcoholic liver disease and acute bleeding esophageal varices. Persistent bleeding despite endoscopic variceal banding and critical illness with hypotension and shock requiring blood transfusion, pressor therapy and intubation. The patient has been emergently transferred from [facility] for a TIPS procedure. EXAM: 1. TRANSJUGULAR INTRAHEPATIC PORTOSYSTEMIC SHUNT PLACEMENT 2. ADDITIONAL CATHETERIZATION OF LEFT GASTRIC VEIN AND TWO ADDITIONAL VARICEAL TRUNKS OFF OF PORTAL CONFLUENCE AND SPLENIC VEIN 3. TRANSCATHETER EMBOLIZATION OF VARICES FOR HEMORRHAGE ASSISTANT: ADAM HENN, M.D.  MEDICATIONS: As antibiotic prophylaxis, 2 g IV Ancef  was ordered pre-procedure and administered intravenously within one hour of incision. ANESTHESIA/SEDATION: General anesthesia-as administered by the Anesthesia department CONTRAST:  100 mL Omnipaque 300 FLUOROSCOPY TIME:  Fluoroscopy Time: 89 minutes and 30 seconds. 3961 mGy. COMPLICATIONS: None immediate. PROCEDURE: Informed written consent was obtained from the patient's wife after a thorough discussion of the procedural risks, benefits and alternatives. All questions were addressed. Maximal Sterile Barrier Technique was utilized including caps, mask, sterile gowns, sterile gloves, sterile drape, hand hygiene and skin antiseptic. A timeout was performed prior to the initiation of the procedure. The right neck and abdominal wall were prepped with chlorhexidine and draped. Ultrasound was performed of the liver. Under direct ultrasound guidance, portal vein access was performed in the right lobe with a 21 gauge needle. A guidewire was advanced. A 3 French dilator was then advanced over the wire and into the portal vein. Portal venography was performed through the dilator. Ultrasound was used to confirm patency of the right internal jugular vein. Under direct ultrasound guidance, access of the right internal jugular vein was performed with a 21 gauge needle and micropuncture set. Over a guidewire, venous access was dilated and a 10 French angled sheath advanced. The sheath was positioned in the superior aspect of the inferior vena cava. A 5 French catheter was then used to selectively catheterize the right hepatic vein. The catheter was advanced in the right hepatic vein and selective hepatic venography performed. The 10 French sheath was advanced into the right hepatic vein. A Colapinto needle and needle sheath were advanced through the 10 French sheath. The needle was then advanced in the liver parenchyma and used to gain access to the level of the right portal  vein utilizing the portal 3 [place] dilator as a target for needle puncture. After gaining access into the right portal venous system, a guidewire was advanced. A catheter was then able to be advanced over the guidewire and into the portal vein via hepatic venous access. Portal venography was performed. Over a guidewire, the intraparenchymal tract between the right hepatic vein and right portal vein was then dilated utilizing 5 mm and 7 mm diameter balloons. This allowed advancement of the 10 French sheath into the portal vein. A marking pigtail catheter was then utilized in performing venography to estimate length of covered stent required for shunt placement. A Gore Viatorr covered stent prosthesis with diameter of 10 mm, covered stent length of 8 cm and uncovered length of 2 cm was chosen for placement. The stent was advanced through the sheath and deployed. After deployment additional venography was performed. The entire prosthesis was dilated to 8 mm. A 5 French catheter was used to selectively catheterize the left gastric vein. Selective venography was performed. The catheter was further advanced into the vein. Embolization coils were then advanced through the 5 French catheter to the level of varices. The 5 French catheter was used to selectively catheterize an additional variceal trunk off of the splenic vein. Selective venography was performed. A Lantern microcatheter was advanced through the 5 [place] catheter. Multiple Ruby microcoils were than deployed via the microcatheter within varices. An Amplatzer Vascular Plug 4 was deployed through the 5 [place] catheter. A third variceal trunk off of the splenic vein was then catheterized with a 5 French catheter. The Lantern microcatheter was advanced through the 5 [place] catheter. Multiple Ruby microcoils were deployed within varices. Additional venography was performed during embolization to assess flow. Additional venography was performed as well as  portosystemic pressure measurements. The  TIPS shunt was dilated to 10 mm. Additional pressure measurements were made. The 3 French portal vein dilator was removed and a dressing applied at the abdominal exit site. The 10 French sheath was then removed from the right jugular access site and hemostasis obtained with manual compression. A dressing was applied at the exit site. FINDINGS: After portal venous access portal venography demonstrates widely patent main portal vein and intrahepatic left and right portal veins. No evidence of portal vein thrombus. Slow flow is present but flow is towards the liver. After hepatic vein catheterization, the right hepatic vein demonstrates normal patency. After establishing needle access between the right hepatic vein and right portal vein, portal venography demonstrates a large left gastric vein emanating from the portal confluence and supplying esophageal varices as well as 2 additional trunks emanating from the central aspect of the splenic vein and supplying esophageal varices. A TIPS shunt was successfully created. After covered stent deployment, variceal embolization was performed at the level of 3 separate trunks supplying varices with deployment of embolization coils as well as an Amplatzer Vascular Plug. There was successful diminishment in flow to varices. After TIPS creation and dilatation there was excellent flow through the shunt. Initial pressure gradient measurements demonstrated a portosystemic pressure gradient of approximately 7-8 mm Hg. With repeat measurement there did appear to be some additional gradient at the level of the hepatic vein just beyond the stent stented segment near the hepatic vein confluence. Venography did not demonstrate significant stenosis of the vein in this region and this segment of the vein was dilated to 10 mm. It was not felt necessary to place another stent or covered stent to extend the stented segment. IMPRESSION: Successful  placement of TIPS shunt between right hepatic vein and right portal vein with placement of 10 mm diameter Viatorr covered stent dilated to 10 mm. The left gastric vein and 2 additional trunks supplying prominent esophageal varices were also embolized with embolization coils a vascular plug. Electronically Signed   By: Aletta Edouard M.D.   On: 05/16/2020 13:21   IR Tips  Result Date: 05/16/2020 CLINICAL DATA:  Alcoholic liver disease and acute bleeding esophageal varices. Persistent bleeding despite endoscopic variceal banding and critical illness with hypotension and shock requiring blood transfusion, pressor therapy and intubation. The patient has been emergently transferred from [facility] for a TIPS procedure. EXAM: 1. TRANSJUGULAR INTRAHEPATIC PORTOSYSTEMIC SHUNT PLACEMENT 2. ADDITIONAL CATHETERIZATION OF LEFT GASTRIC VEIN AND TWO ADDITIONAL VARICEAL TRUNKS OFF OF PORTAL CONFLUENCE AND SPLENIC VEIN 3. TRANSCATHETER EMBOLIZATION OF VARICES FOR HEMORRHAGE ASSISTANT: ADAM HENN, M.D. MEDICATIONS: As antibiotic prophylaxis, 2 g IV Ancef was ordered pre-procedure and administered intravenously within one hour of incision. ANESTHESIA/SEDATION: General anesthesia-as administered by the Anesthesia department CONTRAST:  100 mL Omnipaque 300 FLUOROSCOPY TIME:  Fluoroscopy Time: 89 minutes and 30 seconds. 3961 mGy. COMPLICATIONS: None immediate. PROCEDURE: Informed written consent was obtained from the patient's wife after a thorough discussion of the procedural risks, benefits and alternatives. All questions were addressed. Maximal Sterile Barrier Technique was utilized including caps, mask, sterile gowns, sterile gloves, sterile drape, hand hygiene and skin antiseptic. A timeout was performed prior to the initiation of the procedure. The right neck and abdominal wall were prepped with chlorhexidine and draped. Ultrasound was performed of the liver. Under direct ultrasound guidance, portal vein access was  performed in the right lobe with a 21 gauge needle. A guidewire was advanced. A 3 French dilator was then advanced over the wire and into the  portal vein. Portal venography was performed through the dilator. Ultrasound was used to confirm patency of the right internal jugular vein. Under direct ultrasound guidance, access of the right internal jugular vein was performed with a 21 gauge needle and micropuncture set. Over a guidewire, venous access was dilated and a 10 French angled sheath advanced. The sheath was positioned in the superior aspect of the inferior vena cava. A 5 French catheter was then used to selectively catheterize the right hepatic vein. The catheter was advanced in the right hepatic vein and selective hepatic venography performed. The 10 French sheath was advanced into the right hepatic vein. A Colapinto needle and needle sheath were advanced through the 10 French sheath. The needle was then advanced in the liver parenchyma and used to gain access to the level of the right portal vein utilizing the portal 3 [place] dilator as a target for needle puncture. After gaining access into the right portal venous system, a guidewire was advanced. A catheter was then able to be advanced over the guidewire and into the portal vein via hepatic venous access. Portal venography was performed. Over a guidewire, the intraparenchymal tract between the right hepatic vein and right portal vein was then dilated utilizing 5 mm and 7 mm diameter balloons. This allowed advancement of the 10 French sheath into the portal vein. A marking pigtail catheter was then utilized in performing venography to estimate length of covered stent required for shunt placement. A Gore Viatorr covered stent prosthesis with diameter of 10 mm, covered stent length of 8 cm and uncovered length of 2 cm was chosen for placement. The stent was advanced through the sheath and deployed. After deployment additional venography was performed. The  entire prosthesis was dilated to 8 mm. A 5 French catheter was used to selectively catheterize the left gastric vein. Selective venography was performed. The catheter was further advanced into the vein. Embolization coils were then advanced through the 5 French catheter to the level of varices. The 5 French catheter was used to selectively catheterize an additional variceal trunk off of the splenic vein. Selective venography was performed. A Lantern microcatheter was advanced through the 5 [place] catheter. Multiple Ruby microcoils were than deployed via the microcatheter within varices. An Amplatzer Vascular Plug 4 was deployed through the 5 [place] catheter. A third variceal trunk off of the splenic vein was then catheterized with a 5 French catheter. The Lantern microcatheter was advanced through the 5 [place] catheter. Multiple Ruby microcoils were deployed within varices. Additional venography was performed during embolization to assess flow. Additional venography was performed as well as portosystemic pressure measurements. The TIPS shunt was dilated to 10 mm. Additional pressure measurements were made. The 3 French portal vein dilator was removed and a dressing applied at the abdominal exit site. The 10 French sheath was then removed from the right jugular access site and hemostasis obtained with manual compression. A dressing was applied at the exit site. FINDINGS: After portal venous access portal venography demonstrates widely patent main portal vein and intrahepatic left and right portal veins. No evidence of portal vein thrombus. Slow flow is present but flow is towards the liver. After hepatic vein catheterization, the right hepatic vein demonstrates normal patency. After establishing needle access between the right hepatic vein and right portal vein, portal venography demonstrates a large left gastric vein emanating from the portal confluence and supplying esophageal varices as well as 2 additional trunks  emanating from the central aspect of the splenic vein and  supplying esophageal varices. A TIPS shunt was successfully created. After covered stent deployment, variceal embolization was performed at the level of 3 separate trunks supplying varices with deployment of embolization coils as well as an Amplatzer Vascular Plug. There was successful diminishment in flow to varices. After TIPS creation and dilatation there was excellent flow through the shunt. Initial pressure gradient measurements demonstrated a portosystemic pressure gradient of approximately 7-8 mm Hg. With repeat measurement there did appear to be some additional gradient at the level of the hepatic vein just beyond the stent stented segment near the hepatic vein confluence. Venography did not demonstrate significant stenosis of the vein in this region and this segment of the vein was dilated to 10 mm. It was not felt necessary to place another stent or covered stent to extend the stented segment. IMPRESSION: Successful placement of TIPS shunt between right hepatic vein and right portal vein with placement of 10 mm diameter Viatorr covered stent dilated to 10 mm. The left gastric vein and 2 additional trunks supplying prominent esophageal varices were also embolized with embolization coils a vascular plug. Electronically Signed   By: Aletta Edouard M.D.   On: 05/16/2020 13:21   DG CHEST PORT 1 VIEW  Result Date: 05/16/2020 CLINICAL DATA:  Hypoxia EXAM: PORTABLE CHEST 1 VIEW COMPARISON:  May 16, 2020 study obtained earlier in the day FINDINGS: Endotracheal tube tip is 1.8 cm above the carina. Central catheter tip is in the superior vena cava. Nasogastric tube tip and side port are below the diaphragm. No pneumothorax. There is bibasilar atelectasis, more on the left than on the right. Lungs elsewhere are clear. Heart is upper normal in size with pulmonary vascularity normal. No adenopathy. No bone lesions. IMPRESSION: Tube and catheter positions  as described without pneumothorax. Bibasilar atelectasis, slightly more on the left than on the right. Lungs elsewhere clear. Stable cardiac silhouette. Electronically Signed   By: Lowella Grip III M.D.   On: 05/16/2020 10:54   DG Chest Port 1 View  Result Date: 05/16/2020 CLINICAL DATA:  Endotracheally intubated. EXAM: PORTABLE CHEST 1 VIEW COMPARISON:  Radiograph yesterday. FINDINGS: Endotracheal tube tip is at the level of the clavicular heads 5.3 cm from the carina. Enteric tube in place with tip below the diaphragm not included in the field of view. Right subclavian central line unchanged. Stable heart size and mediastinal contours. Mild bibasilar atelectasis. No pneumothorax or pleural effusion. IMPRESSION: 1. Endotracheal tube tip at the level of the clavicular heads 5.3 cm from the carina. Enteric tube in place with tip below the diaphragm not included in the field of view. 2. Right central line tip in the SVC. 3. Mild bibasilar atelectasis. Electronically Signed   By: Keith Rake M.D.   On: 05/16/2020 00:33   DG Abd Portable 1V  Result Date: 05/16/2020 CLINICAL DATA:  OG tube placement EXAM: PORTABLE ABDOMEN - 1 VIEW COMPARISON:  CT 05/14/2020 FINDINGS: Transesophageal tube tip terminates in the right upper quadrant likely at the level of the gastric antrum/duodenal bulb with side port beyond the GE junction. Upper abdominal vascular stent and embolization coils are noted. Telemetry leads overlie the upper abdomen as well. Redemonstration of the diffuse air distended and clustered small bowel in the mid abdomen compatible with a high-grade obstruction seen on comparison CT. No acute osseous abnormality. Bilateral total hip arthroplasties are noted. IMPRESSION: 1. Transesophageal tube tip terminates in the right upper quadrant likely at the level of the gastric antrum/duodenal bulb. Side port beyond the  GE junction. 2. Persistent high-grade small bowel obstruction seen on comparison CT.  Electronically Signed   By: Lovena Le M.D.   On: 05/16/2020 01:29   IR EMBO ART  VEN HEMORR LYMPH EXTRAV  INC GUIDE ROADMAPPING  Result Date: 05/16/2020 CLINICAL DATA:  Alcoholic liver disease and acute bleeding esophageal varices. Persistent bleeding despite endoscopic variceal banding and critical illness with hypotension and shock requiring blood transfusion, pressor therapy and intubation. The patient has been emergently transferred from [facility] for a TIPS procedure. EXAM: 1. TRANSJUGULAR INTRAHEPATIC PORTOSYSTEMIC SHUNT PLACEMENT 2. ADDITIONAL CATHETERIZATION OF LEFT GASTRIC VEIN AND TWO ADDITIONAL VARICEAL TRUNKS OFF OF PORTAL CONFLUENCE AND SPLENIC VEIN 3. TRANSCATHETER EMBOLIZATION OF VARICES FOR HEMORRHAGE ASSISTANT: ADAM HENN, M.D. MEDICATIONS: As antibiotic prophylaxis, 2 g IV Ancef was ordered pre-procedure and administered intravenously within one hour of incision. ANESTHESIA/SEDATION: General anesthesia-as administered by the Anesthesia department CONTRAST:  100 mL Omnipaque 300 FLUOROSCOPY TIME:  Fluoroscopy Time: 89 minutes and 30 seconds. 3961 mGy. COMPLICATIONS: None immediate. PROCEDURE: Informed written consent was obtained from the patient's wife after a thorough discussion of the procedural risks, benefits and alternatives. All questions were addressed. Maximal Sterile Barrier Technique was utilized including caps, mask, sterile gowns, sterile gloves, sterile drape, hand hygiene and skin antiseptic. A timeout was performed prior to the initiation of the procedure. The right neck and abdominal wall were prepped with chlorhexidine and draped. Ultrasound was performed of the liver. Under direct ultrasound guidance, portal vein access was performed in the right lobe with a 21 gauge needle. A guidewire was advanced. A 3 French dilator was then advanced over the wire and into the portal vein. Portal venography was performed through the dilator. Ultrasound was used to confirm patency  of the right internal jugular vein. Under direct ultrasound guidance, access of the right internal jugular vein was performed with a 21 gauge needle and micropuncture set. Over a guidewire, venous access was dilated and a 10 French angled sheath advanced. The sheath was positioned in the superior aspect of the inferior vena cava. A 5 French catheter was then used to selectively catheterize the right hepatic vein. The catheter was advanced in the right hepatic vein and selective hepatic venography performed. The 10 French sheath was advanced into the right hepatic vein. A Colapinto needle and needle sheath were advanced through the 10 French sheath. The needle was then advanced in the liver parenchyma and used to gain access to the level of the right portal vein utilizing the portal 3 [place] dilator as a target for needle puncture. After gaining access into the right portal venous system, a guidewire was advanced. A catheter was then able to be advanced over the guidewire and into the portal vein via hepatic venous access. Portal venography was performed. Over a guidewire, the intraparenchymal tract between the right hepatic vein and right portal vein was then dilated utilizing 5 mm and 7 mm diameter balloons. This allowed advancement of the 10 French sheath into the portal vein. A marking pigtail catheter was then utilized in performing venography to estimate length of covered stent required for shunt placement. A Gore Viatorr covered stent prosthesis with diameter of 10 mm, covered stent length of 8 cm and uncovered length of 2 cm was chosen for placement. The stent was advanced through the sheath and deployed. After deployment additional venography was performed. The entire prosthesis was dilated to 8 mm. A 5 French catheter was used to selectively catheterize the left gastric vein. Selective venography was  performed. The catheter was further advanced into the vein. Embolization coils were then advanced through  the 5 French catheter to the level of varices. The 5 French catheter was used to selectively catheterize an additional variceal trunk off of the splenic vein. Selective venography was performed. A Lantern microcatheter was advanced through the 5 [place] catheter. Multiple Ruby microcoils were than deployed via the microcatheter within varices. An Amplatzer Vascular Plug 4 was deployed through the 5 [place] catheter. A third variceal trunk off of the splenic vein was then catheterized with a 5 French catheter. The Lantern microcatheter was advanced through the 5 [place] catheter. Multiple Ruby microcoils were deployed within varices. Additional venography was performed during embolization to assess flow. Additional venography was performed as well as portosystemic pressure measurements. The TIPS shunt was dilated to 10 mm. Additional pressure measurements were made. The 3 French portal vein dilator was removed and a dressing applied at the abdominal exit site. The 10 French sheath was then removed from the right jugular access site and hemostasis obtained with manual compression. A dressing was applied at the exit site. FINDINGS: After portal venous access portal venography demonstrates widely patent main portal vein and intrahepatic left and right portal veins. No evidence of portal vein thrombus. Slow flow is present but flow is towards the liver. After hepatic vein catheterization, the right hepatic vein demonstrates normal patency. After establishing needle access between the right hepatic vein and right portal vein, portal venography demonstrates a large left gastric vein emanating from the portal confluence and supplying esophageal varices as well as 2 additional trunks emanating from the central aspect of the splenic vein and supplying esophageal varices. A TIPS shunt was successfully created. After covered stent deployment, variceal embolization was performed at the level of 3 separate trunks supplying varices  with deployment of embolization coils as well as an Amplatzer Vascular Plug. There was successful diminishment in flow to varices. After TIPS creation and dilatation there was excellent flow through the shunt. Initial pressure gradient measurements demonstrated a portosystemic pressure gradient of approximately 7-8 mm Hg. With repeat measurement there did appear to be some additional gradient at the level of the hepatic vein just beyond the stent stented segment near the hepatic vein confluence. Venography did not demonstrate significant stenosis of the vein in this region and this segment of the vein was dilated to 10 mm. It was not felt necessary to place another stent or covered stent to extend the stented segment. IMPRESSION: Successful placement of TIPS shunt between right hepatic vein and right portal vein with placement of 10 mm diameter Viatorr covered stent dilated to 10 mm. The left gastric vein and 2 additional trunks supplying prominent esophageal varices were also embolized with embolization coils a vascular plug. Electronically Signed   By: Aletta Edouard M.D.   On: 05/16/2020 13:21    Labs:  CBC: Recent Labs    05/16/20 0019 05/16/20 0026 05/16/20 0346 05/16/20 0610  WBC 23.8*  --   --  23.8*  HGB 9.1* 9.9* 8.9* 7.9*  7.9*  HCT 27.4* 29.0* 26.7* 23.6*  23.4*  PLT 180  188  --   --  177    COAGS: Recent Labs    05/16/20 0019  INR 2.5*  2.5*  APTT 39*    BMP: Recent Labs    05/15/20 2241 05/16/20 0019 05/16/20 0026 05/16/20 0610  NA 136 134* 139 134*  K 3.3* 3.1* 2.8* 3.2*  CL  --  99  --  99  CO2  --  19*  --  21*  GLUCOSE  --  125*  --  163*  BUN  --  40*  --  45*  CALCIUM  --  6.4*  --  6.5*  CREATININE  --  3.81*  --  3.50*  GFRNONAA  --  18*  --  20*  GFRAA  --  21*  --  23*    LIVER FUNCTION TESTS: Recent Labs    05/16/20 0019  BILITOT 4.7*  AST 109*  ALT 32  ALKPHOS 74  PROT 5.2*  ALBUMIN 1.9*    Assessment and Plan: GI bleed s/p  successful placement of TIPS with 10 mm diameter Viatorr prosthesis. Additional coil and occluder device embolization of variceal trunks supplying esophageal varices. Will follow post TIPS Remains intubated this AM.  Still with dark, bloody stool.  HgB 7.9 this AM. PRBC infusing during visit.  Abdomen distended-- note obstruction on CT Abdomen Pelvis from [facility].  Procedure sites intact.  IR following.   Electronically Signed: Docia Barrier, PA 05/16/2020, 2:38 PM   I spent a total of 15 Minutes at the the patient's bedside AND on the patient's hospital floor or unit, greater than 50% of which was counseling/coordinating care for GI bleed.

## 2020-05-16 NOTE — Progress Notes (Signed)
Initial Nutrition Assessment  DOCUMENTATION CODES:   Not applicable  INTERVENTION:   If unable to initiate enteral nutrition recommend initiate TPN as patient is at high risk of malnutrition.    NUTRITION DIAGNOSIS:   Increased nutrient needs related to catabolic illness (liver disease) as evidenced by estimated needs.  GOAL:   Patient will meet greater than or equal to 90% of their needs  MONITOR:   I & O's  REASON FOR ASSESSMENT:   Ventilator    ASSESSMENT:   Pt with PMH of heavy ETOH abuse (1/2 gallon whiskey per day) admitted from [place] with uncontrolled bleeding and hemorrhagic shock. Pt s/p emergent endoscopy, banding of variceal lesions in esophagus.   Pt discussed during ICU rounds and with RN.  Pt with frank blood from OGT, ETT, dark blood from rectal tube. Pt with SBO and requiring lactulose enema   August 18 CT abdomen > high grade SBO with transition at small umbilical hernia containing a portion on the mid jejunum. Cirrhosis with portal venous hypertension and marked abdominal varices.   Patient is currently intubated on ventilator support MV: 10.4 L/min Temp (24hrs), Avg:98.3 F (36.8 C), Min:96.5 F (35.8 C), Max:99 F (37.2 C)  Propofol: 15 ml/hr provides: 396 kcal  Medications reviewed and include: lactulose enema BID, miralax, thiamine  Precedex LR @ 50 ml/hr Levo @ 2 mcg  Octreotide Labs reviewed: Na 134, K+ 3.2, Ammonia 154    NUTRITION - FOCUSED PHYSICAL EXAM:    Most Recent Value  Orbital Region No depletion  Upper Arm Region No depletion  Thoracic and Lumbar Region No depletion  Buccal Region No depletion  Temple Region No depletion  Clavicle Bone Region No depletion  Clavicle and Acromion Bone Region No depletion  Scapular Bone Region No depletion  Dorsal Hand No depletion  Patellar Region No depletion  Anterior Thigh Region No depletion  Posterior Calf Region No depletion  Edema (RD Assessment) None  Hair Reviewed  Eyes  Unable to assess  Mouth Unable to assess  Skin Reviewed  Nails Reviewed       Diet Order:   Diet Order            Diet NPO time specified  Diet effective now                 EDUCATION NEEDS:   No education needs have been identified at this time  Skin:  Skin Assessment: Reviewed RN Assessment  Last BM:  8/20 large via rectal tube (dark red)  Height:   Ht Readings from Last 1 Encounters:  05/15/20 6' (1.829 m)    Weight:   Wt Readings from Last 1 Encounters:  05/16/20 102.1 kg    Ideal Body Weight:  80.9 kg  BMI:  Body mass index is 30.52 kg/m.  Estimated Nutritional Needs:   Kcal:  2500  Protein:  150-165 grams  Fluid:  >2 L/day  Lockie Pares., RD, LDN, CNSC See AMiON for contact information

## 2020-05-16 NOTE — Progress Notes (Addendum)
PCCM INTERVAL PROGRESS NOTE   Lab follow up  CBC    Component Value Date/Time   WBC 23.8 (H) 05/16/2020 0019   RBC 2.99 (L) 05/16/2020 0019   HGB 9.9 (L) 05/16/2020 0026   HCT 29.0 (L) 05/16/2020 0026   PLT 188 05/16/2020 0019   PLT 180 05/16/2020 0019   MCV 91.6 05/16/2020 0019   MCH 30.4 05/16/2020 0019   MCHC 33.2 05/16/2020 0019   RDW 16.9 (H) 05/16/2020 0019   BMP Latest Ref Rng & Units 05/16/2020 05/16/2020  Glucose 70 - 99 mg/dL - 125(H)  BUN 6 - 20 mg/dL - 40(H)  Creatinine 0.61 - 1.24 mg/dL - 3.81(H)  Sodium 135 - 145 mmol/L 139 134(L)  Potassium 3.5 - 5.1 mmol/L 2.8(L) 3.1(L)  Chloride 98 - 111 mmol/L - 99  CO2 22 - 32 mmol/L - 19(L)  Calcium 8.9 - 10.3 mg/dL - 6.4(LL)   Mag 1.1  INR 2.5  Hemoglobin 9.1 is reassuring  KUB with persistent high grade SBO  Plan  Give 40 meq K, 2 g mag, 1 gm calcium gluconate Give 5mg  vit K and one unit FFP With kidney injury, will cautiously replete and repeat chemistry in AM.  Continue OGT to intermittent suction. NPO. May need surgical consultation if obstruction not improving.    Georgann Housekeeper, AGACNP-BC [place]  See Amion for personal pager PCCM on call pager [phone]  05/16/2020 1:44 AM

## 2020-05-16 NOTE — Anesthesia Postprocedure Evaluation (Signed)
Anesthesia Post Note  Patient: Jimmy Gonzales  Procedure(s) Performed: TIPS PROCEDURE (N/A )     Patient location during evaluation: SICU Anesthesia Type: General Level of consciousness: sedated Pain management: pain level controlled Vital Signs Assessment: post-procedure vital signs reviewed and stable Respiratory status: patient remains intubated per anesthesia plan Cardiovascular status: stable Postop Assessment: no apparent nausea or vomiting Anesthetic complications: no   No complications documented.  Last Vitals:  Vitals:   05/16/20 0500 05/16/20 0600  BP: 95/63 98/65  Pulse: 80 77  Resp: 17 18  Temp:    SpO2: 100% 100%    Last Pain:  Vitals:   05/16/20 0400  TempSrc: Axillary                 Catalina Gravel

## 2020-05-16 NOTE — Progress Notes (Signed)
Assisted NP Bowser with ETT exchange. 8.0 placed, 25 at lip. Positive color change and chest rise. Chest XRay ordered. Vitals stable. RT will continue to monitor.

## 2020-05-16 NOTE — Progress Notes (Signed)
Called to bedside RE loss of volume return on ventilator following ETT advancement   Gas exchange ok Cuff does not overtly seem blown Able to pass suction; do not think this is a mechanical kink of ETT Discussed with PCCM DO, will proceed with ETT exchange    Eliseo Gum MSN, AGACNP-BC [place] [phone] If no answer, [phone] 05/16/2020, 10:22 AM

## 2020-05-17 ENCOUNTER — Inpatient Hospital Stay (HOSPITAL_COMMUNITY): Payer: BC Managed Care – PPO

## 2020-05-17 DIAGNOSIS — N179 Acute kidney failure, unspecified: Secondary | ICD-10-CM

## 2020-05-17 LAB — CBC
HCT: 25.6 % — ABNORMAL LOW (ref 39.0–52.0)
HCT: 25.2 % — ABNORMAL LOW (ref 39.0–52.0)
HCT: 26.1 % — ABNORMAL LOW (ref 39.0–52.0)
Hemoglobin: 8.5 g/dL — ABNORMAL LOW (ref 13.0–17.0)
Hemoglobin: 8.4 g/dL — ABNORMAL LOW (ref 13.0–17.0)
Hemoglobin: 8.6 g/dL — ABNORMAL LOW (ref 13.0–17.0)
MCH: 29.9 pg (ref 26.0–34.0)
MCH: 30.2 pg (ref 26.0–34.0)
MCH: 29.9 pg (ref 26.0–34.0)
MCHC: 33.2 g/dL (ref 30.0–36.0)
MCHC: 33.3 g/dL (ref 30.0–36.0)
MCHC: 33 g/dL (ref 30.0–36.0)
MCV: 90.1 fL (ref 80.0–100.0)
MCV: 90.6 fL (ref 80.0–100.0)
MCV: 90.6 fL (ref 80.0–100.0)
Platelets: 130 10*3/uL — ABNORMAL LOW (ref 150–400)
Platelets: 122 10*3/uL — ABNORMAL LOW (ref 150–400)
Platelets: 130 10*3/uL — ABNORMAL LOW (ref 150–400)
RBC: 2.84 MIL/uL — ABNORMAL LOW (ref 4.22–5.81)
RBC: 2.78 MIL/uL — ABNORMAL LOW (ref 4.22–5.81)
RBC: 2.88 MIL/uL — ABNORMAL LOW (ref 4.22–5.81)
RDW: 17.2 % — ABNORMAL HIGH (ref 11.5–15.5)
RDW: 17.2 % — ABNORMAL HIGH (ref 11.5–15.5)
RDW: 17.4 % — ABNORMAL HIGH (ref 11.5–15.5)
WBC: 15.5 10*3/uL — ABNORMAL HIGH (ref 4.0–10.5)
WBC: 14.5 10*3/uL — ABNORMAL HIGH (ref 4.0–10.5)
WBC: 13.2 10*3/uL — ABNORMAL HIGH (ref 4.0–10.5)
nRBC: 0.4 % — ABNORMAL HIGH (ref 0.0–0.2)
nRBC: 0.4 % — ABNORMAL HIGH (ref 0.0–0.2)
nRBC: 0.3 % — ABNORMAL HIGH (ref 0.0–0.2)

## 2020-05-17 LAB — MAGNESIUM
Magnesium: 2.3 mg/dL (ref 1.7–2.4)
Magnesium: 2.5 mg/dL — ABNORMAL HIGH (ref 1.7–2.4)

## 2020-05-17 LAB — BPAM FFP
Blood Product Expiration Date: 202108222359
ISSUE DATE / TIME: 202108200242
Unit Type and Rh: 6200

## 2020-05-17 LAB — COMPREHENSIVE METABOLIC PANEL
ALT: 82 U/L — ABNORMAL HIGH (ref 0–44)
AST: 354 U/L — ABNORMAL HIGH (ref 15–41)
Albumin: 2.7 g/dL — ABNORMAL LOW (ref 3.5–5.0)
Alkaline Phosphatase: 75 U/L (ref 38–126)
Anion gap: 11 (ref 5–15)
BUN: 37 mg/dL — ABNORMAL HIGH (ref 6–20)
CO2: 24 mmol/L (ref 22–32)
Calcium: 7.3 mg/dL — ABNORMAL LOW (ref 8.9–10.3)
Chloride: 105 mmol/L (ref 98–111)
Creatinine, Ser: 1.71 mg/dL — ABNORMAL HIGH (ref 0.61–1.24)
GFR calc Af Amer: 54 mL/min — ABNORMAL LOW (ref >60)
GFR calc non Af Amer: 47 mL/min — ABNORMAL LOW (ref >60)
Glucose, Bld: 132 mg/dL — ABNORMAL HIGH (ref 70–99)
Potassium: 3.1 mmol/L — ABNORMAL LOW (ref 3.5–5.1)
Sodium: 140 mmol/L (ref 135–145)
Total Bilirubin: 6.4 mg/dL — ABNORMAL HIGH (ref 0.3–1.2)
Total Protein: 5.8 g/dL — ABNORMAL LOW (ref 6.5–8.1)

## 2020-05-17 LAB — PREPARE FRESH FROZEN PLASMA

## 2020-05-17 LAB — SODIUM, URINE, RANDOM: Sodium, Ur: 23 mmol/L

## 2020-05-17 LAB — MRSA PCR SCREENING: MRSA by PCR: NEGATIVE

## 2020-05-17 LAB — PROTIME-INR
INR: 1.8 — ABNORMAL HIGH (ref 0.8–1.2)
INR: 1.7 — ABNORMAL HIGH (ref 0.8–1.2)
Prothrombin Time: 20.5 seconds — ABNORMAL HIGH (ref 11.4–15.2)
Prothrombin Time: 19.1 seconds — ABNORMAL HIGH (ref 11.4–15.2)

## 2020-05-17 LAB — AMMONIA: Ammonia: 132 umol/L — ABNORMAL HIGH (ref 9–35)

## 2020-05-17 MED ORDER — POTASSIUM CHLORIDE 20 MEQ/15ML (10%) PO SOLN
40.0000 meq | Freq: Two times a day (BID) | ORAL | Status: AC
  Administered 2020-05-17 (×2): 40 meq
  Filled 2020-05-17 (×2): qty 30

## 2020-05-17 MED ORDER — LEVOTHYROXINE SODIUM 100 MCG/5ML IV SOLN
12.5000 ug | Freq: Every day | INTRAVENOUS | Status: DC
  Filled 2020-05-17: qty 5

## 2020-05-17 MED ORDER — POTASSIUM CHLORIDE 20 MEQ/15ML (10%) PO SOLN: 40.0000 meq | Freq: Two times a day (BID) | ORAL | Status: DC

## 2020-05-17 MED ORDER — POTASSIUM CHLORIDE 10 MEQ/50ML IV SOLN
10.0000 meq | INTRAVENOUS | Status: AC
  Administered 2020-05-17 (×4): 10 meq via INTRAVENOUS
  Filled 2020-05-17 (×4): qty 50

## 2020-05-17 NOTE — Progress Notes (Signed)
Nephrology Follow-Up Consult note   Assessment/Recommendations: Jimmy Gonzales is a/an 49 y.o. male with a past medical history alcohol use disorder who present w/ hemorrhagic/mixed shock, decompensated liver disease, variceal bleeding, and AKI   Non-Oliguric AKI: Likely secondary to hemorrhagic shock and possible prerenal state as well as some degree of ATN. Significantly improved over the past 24 hours. Likely will continue to improve to baseline. -Continue hemodynamic support and blood product transfusion per primary team -Continue to monitor daily Cr while inpatient, Dose meds for GFR -Monitor Daily I/Os, Daily weight  -Maintain MAP>65 for optimal renal perfusion.  -Avoid nephrotoxic medications including NSAIDs and Vanc/Zosyn combo  Acute respiratory failure ventilatory dependence: Continues on the vent per primary team  Metabolic encephalopathy: Likely multifactorial with hepatic dysfunction and alcohol use contributing. Defer management to primary team. Unlikely to be uremia  Hemorrhagic shock secondary to variceal bleeding: Status post TIPS.  Continued blood product and management of varices administration per primary team  Alcoholic cirrhosis with portal hypertension: Meld 35.  Status post TIPS. Aggressive management per primary team and GI  Hypotension/shock: Multifactorial related to liver disease and possible infection. Supportive measures with medications as needed by primary team  Hypokalemia: Potassium 3.1 today 40 M EQ of potassium today. Continue potassium as needed  Given the patient's significant improvement we will sign off at this time. If we are needed in the future do not hesitate to call.   Recommendations conveyed to primary service.    [facility] 05/17/2020 10:53 AM  ___________________________________________________________  CC: AKI  Interval History/Subjective: The patient remains critically ill requiring the  ventilator. Urine output has been excellent with 3.4 L of urine made. Creatinine significantly improved to 1.7 today.   Medications:  Current Facility-Administered Medications  Medication Dose Route Frequency Provider Last Rate Last Admin  . 0.9 %  sodium chloride infusion (Manually program via Guardrails IV Fluids)   Intravenous Once Bowser, Laurel Dimmer, NP      . 0.9 %  sodium chloride infusion  250 mL Intravenous Continuous Bowser, Laurel Dimmer, NP      . albumin human 25 % solution 25 g  25 g Intravenous Q6H Baron-Johnson, Bryson Ha, PA-C   Paused at 05/17/20 1610  . chlorhexidine gluconate (MEDLINE KIT) (PERIDEX) 0.12 % solution 15 mL  15 mL Mouth Rinse BID Frederik Pear, MD   15 mL at 05/17/20 0820  . Chlorhexidine Gluconate Cloth 2 % PADS 6 each  6 each Topical Daily Bowser, Laurel Dimmer, NP   6 each at 05/17/20 0150  . dexmedetomidine (PRECEDEX) 400 MCG/100ML (4 mcg/mL) infusion  0.4-1.2 mcg/kg/hr Intravenous Titrated Corey Harold, NP 12.76 mL/hr at 05/17/20 1000 0.5 mcg/kg/hr at 05/17/20 1000  . fentaNYL (SUBLIMAZE) bolus via infusion 50 mcg  50 mcg Intravenous Q15 min PRN Corey Harold, NP      . fentaNYL (SUBLIMAZE) injection 100 mcg  100 mcg Intravenous Once Corey Harold, NP      . fentaNYL 2550mg in NS 2577m(1024mml) infusion-PREMIX  50-200 mcg/hr Intravenous Continuous HofCorey HaroldP 15 mL/hr at 05/17/20 1000 150 mcg/hr at 05/17/20 1000  . hydrALAZINE (APRESOLINE) injection 10-20 mg  10-20 mg Intravenous Q4H PRN HofCorey HaroldP      . iohexol (OMNIPAQUE) 300 MG/ML solution 100 mL  100 mL Intravenous Once PRN YamAletta EdouardD      . iohexol (OMNIPAQUE) 300 MG/ML solution 100 mL  100 mL Intravenous Once PRN YamAletta Edouard  MD      . iohexol (OMNIPAQUE) 300 MG/ML solution 150 mL  150 mL Intravenous Once PRN Aletta Edouard, MD      . lactulose (CHRONULAC) 10 GM/15ML solution 30 g  30 g Per Tube QID Salley Slaughter, PA-C   30 g at 05/16/20 2128  . MEDLINE mouth rinse   15 mL Mouth Rinse 10 times per day Frederik Pear, MD   15 mL at 05/17/20 0609  . methylPREDNISolone sodium succinate (SOLU-MEDROL) 40 mg/mL injection 40 mg  40 mg Intravenous Q24H Cristal Generous, NP   40 mg at 05/16/20 1826  . midazolam (VERSED) injection 1-2 mg  1-2 mg Intravenous Q4H PRN Frederik Pear, MD   2 mg at 05/16/20 0747  . norepinephrine (LEVOPHED) 32m in 2559mpremix infusion  0-40 mcg/min Intravenous Titrated OgFrederik PearMD   Stopped at 05/17/20 0240  . octreotide (SANDOSTATIN) 500 mcg in sodium chloride 0.9 % 250 mL (2 mcg/mL) infusion  50 mcg/hr Intravenous Continuous Bowser, GrLaurel DimmerNP 25 mL/hr at 05/17/20 1000 50 mcg/hr at 05/17/20 1000  . pantoprazole (PROTONIX) 80 mg in sodium chloride 0.9 % 100 mL (0.8 mg/mL) infusion  8 mg/hr Intravenous Continuous BoCristal GenerousNP 10 mL/hr at 05/16/20 0056 8 mg/hr at 05/16/20 0056  . [START ON 05/19/2020] pantoprazole (PROTONIX) injection 40 mg  40 mg Intravenous Q12H Bowser, Grace E, NP      . piperacillin-tazobactam (ZOSYN) IVPB 3.375 g  3.375 g Intravenous Q8H McJuanito DoomMD   Stopped at 05/17/20 [id number]. potassium chloride 10 mEq in 50 mL *CENTRAL LINE* IVPB  10 mEq Intravenous Q1 Hr x 4 PeReesa ChewMD 50 mL/hr at 05/17/20 0925 10 mEq at 05/17/20 0925  . potassium chloride 20 MEQ/15ML (10%) solution 40 mEq  40 mEq Per Tube BID Icard, Bradley L, DO      . propofol (DIPRIVAN) 1000 MG/100ML infusion  5-80 mcg/kg/min Intravenous Titrated BoCristal GenerousNP   Paused at 05/17/20 [id number]. rifaximin (XIFAXAN) tablet 550 mg  550 mg Per Tube BID BoCristal GenerousNP   550 mg at 05/16/20 2128  . rocuronium (ZEMURON) injection 50 mg  50 mg Intravenous Once Icard, Bradley L, DO      . thiamine (B-1) injection 100 mg  100 mg Intravenous Daily McSimonne Maffucci, MD   100 mg at 05/16/20 1057      Review of Systems: Unable to obtain due to the patient's sedation  Physical Exam: Vitals:   05/17/20 0900 05/17/20 1000   BP: 120/67 105/68  Pulse: 77 76  Resp: 16 16  Temp:    SpO2: 97% 98%   Total I/O In: 546 [I.V.:380; IV Piggyback:166] Out: -   Intake/Output Summary (Last 24 hours) at 05/17/2020 1053 Last data filed at 05/17/2020 1000 Gross per 24 hour  Intake 5697.54 ml  Output 4200 ml  Net 1497.54 ml   Constitutional: Ill-appearing, intubated, lying in bed ENMT: ears and nose without scars or lesions, MMM, ET tube in place CV: normal rate, trace pitting in the lower extremities Respiratory: Coarse bilateral breath sounds, bilateral chest rise Gastrointestinal: Bowel sounds present, mild distention Skin: no visible lesions or rashes Psych: Sedated, unable to answer questions   Test Results I personally reviewed new and old clinical labs and radiology tests Lab Results  Component Value Date   NA 140 05/17/2020   K 3.1 (L) 05/17/2020   CL 105 05/17/2020  CO2 24 05/17/2020   BUN 37 (H) 05/17/2020   CREATININE 1.71 (H) 05/17/2020   CALCIUM 7.3 (L) 05/17/2020   ALBUMIN 2.7 (L) 05/17/2020   PHOS 4.9 (H) 05/16/2020

## 2020-05-17 NOTE — Progress Notes (Signed)
NAME:  Jimmy Gonzales, MRN:  [id number], DOB:  Jan 25, 1971, LOS: 2 ADMISSION DATE:  05/15/2020, CONSULTATION DATE: May 15, 2020 REFERRING MD: Dr. Kathlene Cote, CHIEF COMPLAINT: Vomiting blood  Brief History   49 year old male with a past medical history significant for heavy alcohol abuse presented to [facility] with hematemesis, developed hemorrhagic shock, GI unable to control bleeding with endoscopy.  Transferred to [facility] for emergent TIPS placement.  History of present illness   This is a 49 year old male who has a past medical history significant for drinking 1/2 gallon of hard liquor a day who presented to [facility] with a chief complaint of hematemesis on August 18.  Apparently he had developed abdominal pain and nausea and vomiting which was initially nonbilious nonbloody for 2 days prior to admission.  He was on the way to see his physician on August 18 when he suddenly developed hematemesis.  In the emergency department he was noted to have a large ventral hernia which was incarcerated based on CT scanning.  This was easily reduced and he was admitted to the intensive care unit on an octreotide infusion.  However, hematemesis and hematochezia persisted.  He required intubation, central line placement, vasopressor infusion, multiple blood transfusions, and emergent endoscopy.  Unfortunately endoscopy was incomplete as the patient had significant bleeding.  7 bands were placed but esophageal variceal bleeding was unable to be controlled.  Reportedly Covid negative. He was transferred to [facility] for emergent TIPS. 2 units PRBC given during the procedure. PCCM accepted the patient for ICU admission.   Past Medical History  Alcohol abuse  Significant Hospital Events   8/ 18 admitted [facility] 8/ 19 emergent endoscopy, banding of variceal lesions in esophagus, transferred to [facility] for emergent TIPS procedure 8/ 20 continued blood  product administration   Consults:  Interventional radiology  Procedures:  August 19 endotracheal tube August 19 right IJ central venous line August 19 arterial line   Significant Diagnostic Tests:  August 18 CT abdomen > high grade SBO with transition at small umbilical hernia containing a portion on the mid jejunum. Cirrhosis with portal venous hypertension and marked abdominal varices.   Micro Data:  August 19 SARS-CoV-2> negative Hepatitis A IgM ab neg Hepatitis Bs antigen negative Hepatitis B core IGM negative Hepatitis C antibody 0.1   Antimicrobials:  August 19 Zosyn>  Interim history/subjective:   Critically ill. Intubated on life support.   Objective   Blood pressure 106/61, pulse 77, temperature 99.3 F (37.4 C), temperature source Axillary, resp. rate 11, height 6' (1.829 m), weight 102.1 kg, SpO2 97 %. CVP:  [5 mmHg-12 mmHg] 9 mmHg  Vent Mode: PRVC FiO2 (%):  [40 %-60 %] 40 % Set Rate:  [15 bmp] 15 bmp Vt Set:  [620 mL] 620 mL PEEP:  [5 cmH20] 5 cmH20 Plateau Pressure:  [16 cmH20-22 cmH20] 21 cmH20   Intake/Output Summary (Last 24 hours) at 05/17/2020 0743 Last data filed at 05/17/2020 0700 Gross per 24 hour  Intake 5564.79 ml  Output 4200 ml  Net 1364.79 ml   Filed Weights   05/16/20 0100  Weight: 102.1 kg    Examination: General: middle aged male, resting in bed HENT: NCAT, sclera clear  Lungs: BL vented breaths  Cardiovascular: RRR, s1 s2  Abdomen: distended  Extremities: edema BL  Neuro: off sedation moves all four ext  Skin: jaundiced   Resolved Hospital Problem list     Assessment & Plan:   AHRF  requiring intubation and mechanical ventilations  In the setting of variceal bleeding and hemorrhagic shock  P: - continue full mech vent support  - VAP ppx  Mental status precludes liberation at this time as well as oxygenation. Continue to wean FiO2 as tolerated  Metabolic encephalopathy, multifactorial  EtOH abuse with risk for dts    -no hx of seizure with prior withdrawals however use was lighter  Acute hepatic encephalopathy P Sedation with propofol, Precedex, fentanyl Continue scheduled lactulose  Hemorrhagic shock in setting of GIB, coagulopathy  -s/p 2 PRBC, 1 FFP, Vit K, albumin  -8/20 blood in ETT OGT BMS  P Follow CBC Hemoglobin transfusion threshold, conservative less than 7  Acute upper GI Bleed due to esophageal variceal bleeding - s/p emergent TIPS 8/19 P Continue octreotide infusion PPI infusion Remains on piperacillin tazobactam Can consider switching to ceftriaxone in the setting for SBP prophylaxis with bleeding.  Decompensated alcoholic cirrhosis with portal hypertension: S/p TIPS 8/29 Coagulopathy, hyperbilirubinemia  S/p FFB, Vit K 8/20 overnight Acute alcoholic hepatitis.  MELD 35 P Status post TIPS  Leukocytosis, possible sepsis Elevated lactic Acid P Continue antimicrobials, Zosyn  Acute kidney injury:  -Likely prerenal injury in setting of hemorrhagic shock, certainly at risk HRS however  -Cr 3.5 8/20 P Kidney function seems to be improving. Wean norepinephrine to maintain mean artery pressure is 65. Follow urine output Continue albumin supplementation  Hypocalcemia Hypomagnesemia, improving s/p replacement  Hypokalemia Hyperphosphatemia, improving  P Replete as needed for electrolytes.  Incarcerated bowel, reduced at [facility] SBO P No surgical intervention at this time.   Best practice:  Diet: NPO Pain/Anxiety/Delirium protocol (if indicated): As above VAP protocol (if indicated): Yes DVT prophylaxis: SCD GI prophylaxis: Protonix  Glucose control: Monitor Mobility: Bedrest Code Status: Full Family Communication: Updated patient's wife at bedside. Disposition: ICU  Labs   CBC: Recent Labs  Lab 05/16/20 0610 05/16/20 1430 05/16/20 1824 05/16/20 2230 05/17/20 0453  WBC 23.8* 19.2* 23.5* 21.2* 15.5*  HGB 7.9*  7.9* 8.9* 8.9* 9.0* 8.5*   HCT 23.6*  23.4* 25.7* 27.3* 26.9* 25.6*  MCV 89.1 87.7 88.9 89.7 90.1  PLT 177 140* 163 152 130*    Basic Metabolic Panel: Recent Labs  Lab 05/15/20 2241 05/16/20 0019 05/16/20 0026 05/16/20 0610 05/16/20 1824 05/16/20 2241 05/17/20 0453  NA 136 134* 139 134*  --   --  140  K 3.3* 3.1* 2.8* 3.2*  --   --  3.1*  CL  --  99  --  99  --   --  105  CO2  --  19*  --  21*  --   --  24  GLUCOSE  --  125*  --  163*  --   --  132*  BUN  --  40*  --  45*  --   --  37*  CREATININE  --  3.81*  --  3.50*  --   --  1.71*  CALCIUM  --  6.4*  --  6.5*  --   --  7.3*  MG  --  1.1*  --  2.1 1.9 2.1 2.3  PHOS  --  7.7*  --  4.9*  --   --   --    GFR: Estimated Creatinine Clearance: 66 mL/min (A) (by C-G formula based on SCr of 1.71 mg/dL (H)). Recent Labs  Lab 05/16/20 0018 05/16/20 0019 05/16/20 0610 05/16/20 0610 05/16/20 1430 05/16/20 1824 05/16/20 2230 05/17/20 0453  WBC  --    < >  23.8*   < > 19.2* 23.5* 21.2* 15.5*  LATICACIDVEN 5.6*  --  3.0*  --   --   --   --   --    < > = values in this interval not displayed.    Liver Function Tests: Recent Labs  Lab 05/16/20 0019 05/17/20 0453  AST 109* 354*  ALT 32 82*  ALKPHOS 74 75  BILITOT 4.7* 6.4*  PROT 5.2* 5.8*  ALBUMIN 1.9* 2.7*   No results for input(s): LIPASE, AMYLASE in the last 168 hours. Recent Labs  Lab 05/16/20 0208 05/17/20 0453  AMMONIA 154* 132*    ABG    Component Value Date/Time   PHART 7.326 (L) 05/16/2020 0026   PCO2ART 38.8 05/16/2020 0026   PO2ART 112 (H) 05/16/2020 0026   HCO3 20.2 05/16/2020 0026   TCO2 21 (L) 05/16/2020 0026   ACIDBASEDEF 5.0 (H) 05/16/2020 0026   O2SAT 98.0 05/16/2020 0026     Coagulation Profile: Recent Labs  Lab 05/16/20 0019 05/16/20 1430 05/16/20 1824 05/17/20 0453  INR 2.5*  2.5* 2.0* 1.9* 1.8*    Cardiac Enzymes: No results for input(s): CKTOTAL, CKMB, CKMBINDEX, TROPONINI in the last 168 hours.  HbA1C: No results found for: HGBA1C  CBG: No  results for input(s): GLUCAP in the last 168 hours.   This patient is critically ill with multiple organ system failure; which, requires frequent high complexity decision making, assessment, support, evaluation, and titration of therapies. This was completed through the application of advanced monitoring technologies and extensive interpretation of multiple databases. During this encounter critical care time was devoted to patient care services described in this note for 33 minutes.  Garner Nash, DO [facility] 05/17/2020 7:43 AM

## 2020-05-17 NOTE — Progress Notes (Signed)
Stable from GI bleeding standpoint status post TIPS for variceal bleeding 2 days ago.  At present, Flexi-Seal is putting out just a small amount of dark stool.  Hemoglobin stable in the mid 8 range.  Patient is off pressors, still sedated on the ventilator.  Renal function improving.  Impression: Satisfactory control of variceal bleeding via TIPS placement.  Plan: We will follow at a distance, perhaps every several days, to monitor the patient's liver status.  Please call us in the meantime if earlier input is needed or desired.  Cleotis Nipper, M.D. Pager [phone] If no answer or after 5 PM call [phone]

## 2020-05-17 NOTE — Progress Notes (Signed)
Referring Physician(s): Icard,B/Karki,A  Supervising Physician: Jacqulynn Cadet  Patient Status:  [name] - In-pt  Chief Complaint: hematemesis   Subjective: Pt intubated/sedated; wife in room; cont to have dark maroon colored stools in rectal tube; hgb 8.5; BP 105/68   Allergies: Patient has no known allergies.  Medications: Prior to Admission medications   Medication Sig Start Date End Date Taking? Authorizing Provider  bisoprolol-hydrochlorothiazide (ZIAC) 5-6.25 MG tablet Take 1 tablet by mouth daily. 04/18/20  Yes [provider]  cloNIDine (CATAPRES) 0.1 MG tablet Take 0.1 mg by mouth 3 (three) times daily. 03/03/20  Yes [provider]  levothyroxine (SYNTHROID) 25 MCG tablet Take 25 mcg by mouth daily. 04/21/20  Yes [provider]  Naphazoline-Pheniramine (OPCON-A) 0.027-0.315 % SOLN Place 1 drop into both eyes daily as needed (dry eyes/irritation).   Yes [provider]  omega-3 acid ethyl esters (LOVAZA) 1 g capsule Take 2 capsules by mouth daily. 04/29/20  Yes [provider]  omeprazole (PRILOSEC) 20 MG capsule Take 20 mg by mouth daily. 02/19/20  Yes [provider]     Vital Signs: BP 105/68   Pulse 76   Temp 99.5 F (37.5 C) (Axillary)   Resp 16   Ht 6' (1.829 m)   Wt 225 lb (102.1 kg)   SpO2 98%   BMI 30.52 kg/m   Physical Exam intubated, sedated; scleral icterus noted; prev access site rt IJ soft, clean and dry, no hematoma; abd distended  Imaging: IR Angiogram Selective Each Additional Vessel  Result Date: 05/16/2020 CLINICAL DATA:  Alcoholic liver disease and acute bleeding esophageal varices. Persistent bleeding despite endoscopic variceal banding and critical illness with hypotension and shock requiring blood transfusion, pressor therapy and intubation. The patient has been emergently transferred from [facility] for a TIPS procedure. EXAM: 1. TRANSJUGULAR INTRAHEPATIC PORTOSYSTEMIC SHUNT  PLACEMENT 2. ADDITIONAL CATHETERIZATION OF LEFT GASTRIC VEIN AND TWO ADDITIONAL VARICEAL TRUNKS OFF OF PORTAL CONFLUENCE AND SPLENIC VEIN 3. TRANSCATHETER EMBOLIZATION OF VARICES FOR HEMORRHAGE ASSISTANT: ADAM HENN, M.D. MEDICATIONS: As antibiotic prophylaxis, 2 g IV Ancef was ordered pre-procedure and administered intravenously within one hour of incision. ANESTHESIA/SEDATION: General anesthesia-as administered by the Anesthesia department CONTRAST:  100 mL Omnipaque 300 FLUOROSCOPY TIME:  Fluoroscopy Time: 89 minutes and 30 seconds. 3961 mGy. COMPLICATIONS: None immediate. PROCEDURE: Informed written consent was obtained from the patient's wife after a thorough discussion of the procedural risks, benefits and alternatives. All questions were addressed. Maximal Sterile Barrier Technique was utilized including caps, mask, sterile gowns, sterile gloves, sterile drape, hand hygiene and skin antiseptic. A timeout was performed prior to the initiation of the procedure. The right neck and abdominal wall were prepped with chlorhexidine and draped. Ultrasound was performed of the liver. Under direct ultrasound guidance, portal vein access was performed in the right lobe with a 21 gauge needle. A guidewire was advanced. A 3 French dilator was then advanced over the wire and into the portal vein. Portal venography was performed through the dilator. Ultrasound was used to confirm patency of the right internal jugular vein. Under direct ultrasound guidance, access of the right internal jugular vein was performed with a 21 gauge needle and micropuncture set. Over a guidewire, venous access was dilated and a 10 French angled sheath advanced. The sheath was positioned in the superior aspect of the inferior vena cava. A 5 French catheter was then used to selectively catheterize the right hepatic vein. The catheter was advanced in the right hepatic vein and selective  hepatic venography performed. The 10 French sheath was advanced  into the right hepatic vein. A Colapinto needle and needle sheath were advanced through the 10 French sheath. The needle was then advanced in the liver parenchyma and used to gain access to the level of the right portal vein utilizing the portal 3 [place] dilator as a target for needle puncture. After gaining access into the right portal venous system, a guidewire was advanced. A catheter was then able to be advanced over the guidewire and into the portal vein via hepatic venous access. Portal venography was performed. Over a guidewire, the intraparenchymal tract between the right hepatic vein and right portal vein was then dilated utilizing 5 mm and 7 mm diameter balloons. This allowed advancement of the 10 French sheath into the portal vein. A marking pigtail catheter was then utilized in performing venography to estimate length of covered stent required for shunt placement. A Gore Viatorr covered stent prosthesis with diameter of 10 mm, covered stent length of 8 cm and uncovered length of 2 cm was chosen for placement. The stent was advanced through the sheath and deployed. After deployment additional venography was performed. The entire prosthesis was dilated to 8 mm. A 5 French catheter was used to selectively catheterize the left gastric vein. Selective venography was performed. The catheter was further advanced into the vein. Embolization coils were then advanced through the 5 French catheter to the level of varices. The 5 French catheter was used to selectively catheterize an additional variceal trunk off of the splenic vein. Selective venography was performed. A Lantern microcatheter was advanced through the 5 [place] catheter. Multiple Ruby microcoils were than deployed via the microcatheter within varices. An Amplatzer Vascular Plug 4 was deployed through the 5 [place] catheter. A third variceal trunk off of the splenic vein was then catheterized with a 5 French catheter. The Lantern microcatheter was  advanced through the 5 [place] catheter. Multiple Ruby microcoils were deployed within varices. Additional venography was performed during embolization to assess flow. Additional venography was performed as well as portosystemic pressure measurements. The TIPS shunt was dilated to 10 mm. Additional pressure measurements were made. The 3 French portal vein dilator was removed and a dressing applied at the abdominal exit site. The 10 French sheath was then removed from the right jugular access site and hemostasis obtained with manual compression. A dressing was applied at the exit site. FINDINGS: After portal venous access portal venography demonstrates widely patent main portal vein and intrahepatic left and right portal veins. No evidence of portal vein thrombus. Slow flow is present but flow is towards the liver. After hepatic vein catheterization, the right hepatic vein demonstrates normal patency. After establishing needle access between the right hepatic vein and right portal vein, portal venography demonstrates a large left gastric vein emanating from the portal confluence and supplying esophageal varices as well as 2 additional trunks emanating from the central aspect of the splenic vein and supplying esophageal varices. A TIPS shunt was successfully created. After covered stent deployment, variceal embolization was performed at the level of 3 separate trunks supplying varices with deployment of embolization coils as well as an Amplatzer Vascular Plug. There was successful diminishment in flow to varices. After TIPS creation and dilatation there was excellent flow through the shunt. Initial pressure gradient measurements demonstrated a portosystemic pressure gradient of approximately 7-8 mm Hg. With repeat measurement there did appear to be some additional gradient at the level of the hepatic vein just beyond the stent  stented segment near the hepatic vein confluence. Venography did not demonstrate significant  stenosis of the vein in this region and this segment of the vein was dilated to 10 mm. It was not felt necessary to place another stent or covered stent to extend the stented segment. IMPRESSION: Successful placement of TIPS shunt between right hepatic vein and right portal vein with placement of 10 mm diameter Viatorr covered stent dilated to 10 mm. The left gastric vein and 2 additional trunks supplying prominent esophageal varices were also embolized with embolization coils a vascular plug. Electronically Signed   By: Aletta Edouard M.D.   On: 05/16/2020 13:21   IR Angiogram Selective Each Additional Vessel  Result Date: 05/16/2020 CLINICAL DATA:  Alcoholic liver disease and acute bleeding esophageal varices. Persistent bleeding despite endoscopic variceal banding and critical illness with hypotension and shock requiring blood transfusion, pressor therapy and intubation. The patient has been emergently transferred from [facility] for a TIPS procedure. EXAM: 1. TRANSJUGULAR INTRAHEPATIC PORTOSYSTEMIC SHUNT PLACEMENT 2. ADDITIONAL CATHETERIZATION OF LEFT GASTRIC VEIN AND TWO ADDITIONAL VARICEAL TRUNKS OFF OF PORTAL CONFLUENCE AND SPLENIC VEIN 3. TRANSCATHETER EMBOLIZATION OF VARICES FOR HEMORRHAGE ASSISTANT: ADAM HENN, M.D. MEDICATIONS: As antibiotic prophylaxis, 2 g IV Ancef was ordered pre-procedure and administered intravenously within one hour of incision. ANESTHESIA/SEDATION: General anesthesia-as administered by the Anesthesia department CONTRAST:  100 mL Omnipaque 300 FLUOROSCOPY TIME:  Fluoroscopy Time: 89 minutes and 30 seconds. 3961 mGy. COMPLICATIONS: None immediate. PROCEDURE: Informed written consent was obtained from the patient's wife after a thorough discussion of the procedural risks, benefits and alternatives. All questions were addressed. Maximal Sterile Barrier Technique was utilized including caps, mask, sterile gowns, sterile gloves, sterile drape, hand hygiene and skin antiseptic.  A timeout was performed prior to the initiation of the procedure. The right neck and abdominal wall were prepped with chlorhexidine and draped. Ultrasound was performed of the liver. Under direct ultrasound guidance, portal vein access was performed in the right lobe with a 21 gauge needle. A guidewire was advanced. A 3 French dilator was then advanced over the wire and into the portal vein. Portal venography was performed through the dilator. Ultrasound was used to confirm patency of the right internal jugular vein. Under direct ultrasound guidance, access of the right internal jugular vein was performed with a 21 gauge needle and micropuncture set. Over a guidewire, venous access was dilated and a 10 French angled sheath advanced. The sheath was positioned in the superior aspect of the inferior vena cava. A 5 French catheter was then used to selectively catheterize the right hepatic vein. The catheter was advanced in the right hepatic vein and selective hepatic venography performed. The 10 French sheath was advanced into the right hepatic vein. A Colapinto needle and needle sheath were advanced through the 10 French sheath. The needle was then advanced in the liver parenchyma and used to gain access to the level of the right portal vein utilizing the portal 3 [place] dilator as a target for needle puncture. After gaining access into the right portal venous system, a guidewire was advanced. A catheter was then able to be advanced over the guidewire and into the portal vein via hepatic venous access. Portal venography was performed. Over a guidewire, the intraparenchymal tract between the right hepatic vein and right portal vein was then dilated utilizing 5 mm and 7 mm diameter balloons. This allowed advancement of the 10 French sheath into the portal vein. A marking pigtail catheter was then utilized in performing  venography to estimate length of covered stent required for shunt placement. A Gore Viatorr covered  stent prosthesis with diameter of 10 mm, covered stent length of 8 cm and uncovered length of 2 cm was chosen for placement. The stent was advanced through the sheath and deployed. After deployment additional venography was performed. The entire prosthesis was dilated to 8 mm. A 5 French catheter was used to selectively catheterize the left gastric vein. Selective venography was performed. The catheter was further advanced into the vein. Embolization coils were then advanced through the 5 French catheter to the level of varices. The 5 French catheter was used to selectively catheterize an additional variceal trunk off of the splenic vein. Selective venography was performed. A Lantern microcatheter was advanced through the 5 [place] catheter. Multiple Ruby microcoils were than deployed via the microcatheter within varices. An Amplatzer Vascular Plug 4 was deployed through the 5 [place] catheter. A third variceal trunk off of the splenic vein was then catheterized with a 5 French catheter. The Lantern microcatheter was advanced through the 5 [place] catheter. Multiple Ruby microcoils were deployed within varices. Additional venography was performed during embolization to assess flow. Additional venography was performed as well as portosystemic pressure measurements. The TIPS shunt was dilated to 10 mm. Additional pressure measurements were made. The 3 French portal vein dilator was removed and a dressing applied at the abdominal exit site. The 10 French sheath was then removed from the right jugular access site and hemostasis obtained with manual compression. A dressing was applied at the exit site. FINDINGS: After portal venous access portal venography demonstrates widely patent main portal vein and intrahepatic left and right portal veins. No evidence of portal vein thrombus. Slow flow is present but flow is towards the liver. After hepatic vein catheterization, the right hepatic vein demonstrates normal patency. After  establishing needle access between the right hepatic vein and right portal vein, portal venography demonstrates a large left gastric vein emanating from the portal confluence and supplying esophageal varices as well as 2 additional trunks emanating from the central aspect of the splenic vein and supplying esophageal varices. A TIPS shunt was successfully created. After covered stent deployment, variceal embolization was performed at the level of 3 separate trunks supplying varices with deployment of embolization coils as well as an Amplatzer Vascular Plug. There was successful diminishment in flow to varices. After TIPS creation and dilatation there was excellent flow through the shunt. Initial pressure gradient measurements demonstrated a portosystemic pressure gradient of approximately 7-8 mm Hg. With repeat measurement there did appear to be some additional gradient at the level of the hepatic vein just beyond the stent stented segment near the hepatic vein confluence. Venography did not demonstrate significant stenosis of the vein in this region and this segment of the vein was dilated to 10 mm. It was not felt necessary to place another stent or covered stent to extend the stented segment. IMPRESSION: Successful placement of TIPS shunt between right hepatic vein and right portal vein with placement of 10 mm diameter Viatorr covered stent dilated to 10 mm. The left gastric vein and 2 additional trunks supplying prominent esophageal varices were also embolized with embolization coils a vascular plug. Electronically Signed   By: Aletta Edouard M.D.   On: 05/16/2020 13:21   IR Angiogram Selective Each Additional Vessel  Result Date: 05/16/2020 CLINICAL DATA:  Alcoholic liver disease and acute bleeding esophageal varices. Persistent bleeding despite endoscopic variceal banding and critical illness with hypotension and shock  requiring blood transfusion, pressor therapy and intubation. The patient has been  emergently transferred from [facility] for a TIPS procedure. EXAM: 1. TRANSJUGULAR INTRAHEPATIC PORTOSYSTEMIC SHUNT PLACEMENT 2. ADDITIONAL CATHETERIZATION OF LEFT GASTRIC VEIN AND TWO ADDITIONAL VARICEAL TRUNKS OFF OF PORTAL CONFLUENCE AND SPLENIC VEIN 3. TRANSCATHETER EMBOLIZATION OF VARICES FOR HEMORRHAGE ASSISTANT: ADAM HENN, M.D. MEDICATIONS: As antibiotic prophylaxis, 2 g IV Ancef was ordered pre-procedure and administered intravenously within one hour of incision. ANESTHESIA/SEDATION: General anesthesia-as administered by the Anesthesia department CONTRAST:  100 mL Omnipaque 300 FLUOROSCOPY TIME:  Fluoroscopy Time: 89 minutes and 30 seconds. 3961 mGy. COMPLICATIONS: None immediate. PROCEDURE: Informed written consent was obtained from the patient's wife after a thorough discussion of the procedural risks, benefits and alternatives. All questions were addressed. Maximal Sterile Barrier Technique was utilized including caps, mask, sterile gowns, sterile gloves, sterile drape, hand hygiene and skin antiseptic. A timeout was performed prior to the initiation of the procedure. The right neck and abdominal wall were prepped with chlorhexidine and draped. Ultrasound was performed of the liver. Under direct ultrasound guidance, portal vein access was performed in the right lobe with a 21 gauge needle. A guidewire was advanced. A 3 French dilator was then advanced over the wire and into the portal vein. Portal venography was performed through the dilator. Ultrasound was used to confirm patency of the right internal jugular vein. Under direct ultrasound guidance, access of the right internal jugular vein was performed with a 21 gauge needle and micropuncture set. Over a guidewire, venous access was dilated and a 10 French angled sheath advanced. The sheath was positioned in the superior aspect of the inferior vena cava. A 5 French catheter was then used to selectively catheterize the right hepatic vein. The  catheter was advanced in the right hepatic vein and selective hepatic venography performed. The 10 French sheath was advanced into the right hepatic vein. A Colapinto needle and needle sheath were advanced through the 10 French sheath. The needle was then advanced in the liver parenchyma and used to gain access to the level of the right portal vein utilizing the portal 3 [place] dilator as a target for needle puncture. After gaining access into the right portal venous system, a guidewire was advanced. A catheter was then able to be advanced over the guidewire and into the portal vein via hepatic venous access. Portal venography was performed. Over a guidewire, the intraparenchymal tract between the right hepatic vein and right portal vein was then dilated utilizing 5 mm and 7 mm diameter balloons. This allowed advancement of the 10 French sheath into the portal vein. A marking pigtail catheter was then utilized in performing venography to estimate length of covered stent required for shunt placement. A Gore Viatorr covered stent prosthesis with diameter of 10 mm, covered stent length of 8 cm and uncovered length of 2 cm was chosen for placement. The stent was advanced through the sheath and deployed. After deployment additional venography was performed. The entire prosthesis was dilated to 8 mm. A 5 French catheter was used to selectively catheterize the left gastric vein. Selective venography was performed. The catheter was further advanced into the vein. Embolization coils were then advanced through the 5 French catheter to the level of varices. The 5 French catheter was used to selectively catheterize an additional variceal trunk off of the splenic vein. Selective venography was performed. A Lantern microcatheter was advanced through the 5 [place] catheter. Multiple Ruby microcoils were than deployed via the microcatheter within varices. An  Amplatzer Vascular Plug 4 was deployed through the 5 [place] catheter. A  third variceal trunk off of the splenic vein was then catheterized with a 5 French catheter. The Lantern microcatheter was advanced through the 5 [place] catheter. Multiple Ruby microcoils were deployed within varices. Additional venography was performed during embolization to assess flow. Additional venography was performed as well as portosystemic pressure measurements. The TIPS shunt was dilated to 10 mm. Additional pressure measurements were made. The 3 French portal vein dilator was removed and a dressing applied at the abdominal exit site. The 10 French sheath was then removed from the right jugular access site and hemostasis obtained with manual compression. A dressing was applied at the exit site. FINDINGS: After portal venous access portal venography demonstrates widely patent main portal vein and intrahepatic left and right portal veins. No evidence of portal vein thrombus. Slow flow is present but flow is towards the liver. After hepatic vein catheterization, the right hepatic vein demonstrates normal patency. After establishing needle access between the right hepatic vein and right portal vein, portal venography demonstrates a large left gastric vein emanating from the portal confluence and supplying esophageal varices as well as 2 additional trunks emanating from the central aspect of the splenic vein and supplying esophageal varices. A TIPS shunt was successfully created. After covered stent deployment, variceal embolization was performed at the level of 3 separate trunks supplying varices with deployment of embolization coils as well as an Amplatzer Vascular Plug. There was successful diminishment in flow to varices. After TIPS creation and dilatation there was excellent flow through the shunt. Initial pressure gradient measurements demonstrated a portosystemic pressure gradient of approximately 7-8 mm Hg. With repeat measurement there did appear to be some additional gradient at the level of the  hepatic vein just beyond the stent stented segment near the hepatic vein confluence. Venography did not demonstrate significant stenosis of the vein in this region and this segment of the vein was dilated to 10 mm. It was not felt necessary to place another stent or covered stent to extend the stented segment. IMPRESSION: Successful placement of TIPS shunt between right hepatic vein and right portal vein with placement of 10 mm diameter Viatorr covered stent dilated to 10 mm. The left gastric vein and 2 additional trunks supplying prominent esophageal varices were also embolized with embolization coils a vascular plug. Electronically Signed   By: Aletta Edouard M.D.   On: 05/16/2020 13:21   IR Tips  Result Date: 05/16/2020 CLINICAL DATA:  Alcoholic liver disease and acute bleeding esophageal varices. Persistent bleeding despite endoscopic variceal banding and critical illness with hypotension and shock requiring blood transfusion, pressor therapy and intubation. The patient has been emergently transferred from [facility] for a TIPS procedure. EXAM: 1. TRANSJUGULAR INTRAHEPATIC PORTOSYSTEMIC SHUNT PLACEMENT 2. ADDITIONAL CATHETERIZATION OF LEFT GASTRIC VEIN AND TWO ADDITIONAL VARICEAL TRUNKS OFF OF PORTAL CONFLUENCE AND SPLENIC VEIN 3. TRANSCATHETER EMBOLIZATION OF VARICES FOR HEMORRHAGE ASSISTANT: ADAM HENN, M.D. MEDICATIONS: As antibiotic prophylaxis, 2 g IV Ancef was ordered pre-procedure and administered intravenously within one hour of incision. ANESTHESIA/SEDATION: General anesthesia-as administered by the Anesthesia department CONTRAST:  100 mL Omnipaque 300 FLUOROSCOPY TIME:  Fluoroscopy Time: 89 minutes and 30 seconds. 3961 mGy. COMPLICATIONS: None immediate. PROCEDURE: Informed written consent was obtained from the patient's wife after a thorough discussion of the procedural risks, benefits and alternatives. All questions were addressed. Maximal Sterile Barrier Technique was utilized including  caps, mask, sterile gowns, sterile gloves, sterile drape, hand hygiene and  skin antiseptic. A timeout was performed prior to the initiation of the procedure. The right neck and abdominal wall were prepped with chlorhexidine and draped. Ultrasound was performed of the liver. Under direct ultrasound guidance, portal vein access was performed in the right lobe with a 21 gauge needle. A guidewire was advanced. A 3 French dilator was then advanced over the wire and into the portal vein. Portal venography was performed through the dilator. Ultrasound was used to confirm patency of the right internal jugular vein. Under direct ultrasound guidance, access of the right internal jugular vein was performed with a 21 gauge needle and micropuncture set. Over a guidewire, venous access was dilated and a 10 French angled sheath advanced. The sheath was positioned in the superior aspect of the inferior vena cava. A 5 French catheter was then used to selectively catheterize the right hepatic vein. The catheter was advanced in the right hepatic vein and selective hepatic venography performed. The 10 French sheath was advanced into the right hepatic vein. A Col[place]into needle and needle sheath were advanced through the 10 French sheath. The needle was then advanced in the liver parenchyma and used to gain access to the level of the right portal vein utilizing the portal 3 [place] dilator as a target for needle puncture. After gaining access into the right portal venous system, a guidewire was advanced. A catheter was then able to be advanced over the guidewire and into the portal vein via hepatic venous access. Portal venography was performed. Over a guidewire, the intraparenchymal tract between the right hepatic vein and right portal vein was then dilated utilizing 5 mm and 7 mm diameter balloons. This allowed advancement of the 10 French sheath into the portal vein. A marking pigtail catheter was then utilized in performing venography  to estimate length of covered stent required for shunt placement. A Gore Viatorr covered stent prosthesis with diameter of 10 mm, covered stent length of 8 cm and uncovered length of 2 cm was chosen for placement. The stent was advanced through the sheath and deployed. After deployment additional venography was performed. The entire prosthesis was dilated to 8 mm. A 5 French catheter was used to selectively catheterize the left gastric vein. Selective venography was performed. The catheter was further advanced into the vein. Embolization coils were then advanced through the 5 French catheter to the level of varices. The 5 French catheter was used to selectively catheterize an additional variceal trunk off of the splenic vein. Selective venography was performed. A Lantern microcatheter was advanced through the 5 [place] catheter. Multiple Ruby microcoils were than deployed via the microcatheter within varices. An Amplatzer Vascular Plug 4 was deployed through the 5 [place] catheter. A third variceal trunk off of the splenic vein was then catheterized with a 5 French catheter. The Lantern microcatheter was advanced through the 5 [place] catheter. Multiple Ruby microcoils were deployed within varices. Additional venography was performed during embolization to assess flow. Additional venography was performed as well as portosystemic pressure measurements. The TIPS shunt was dilated to 10 mm. Additional pressure measurements were made. The 3 French portal vein dilator was removed and a dressing applied at the abdominal exit site. The 10 French sheath was then removed from the right jugular access site and hemostasis obtained with manual compression. A dressing was applied at the exit site. FINDINGS: After portal venous access portal venography demonstrates widely patent main portal vein and intrahepatic left and right portal veins. No evidence of portal vein thrombus. Slow flow  is present but flow is towards the liver.  After hepatic vein catheterization, the right hepatic vein demonstrates normal patency. After establishing needle access between the right hepatic vein and right portal vein, portal venography demonstrates a large left gastric vein emanating from the portal confluence and supplying esophageal varices as well as 2 additional trunks emanating from the central aspect of the splenic vein and supplying esophageal varices. A TIPS shunt was successfully created. After covered stent deployment, variceal embolization was performed at the level of 3 separate trunks supplying varices with deployment of embolization coils as well as an Amplatzer Vascular Plug. There was successful diminishment in flow to varices. After TIPS creation and dilatation there was excellent flow through the shunt. Initial pressure gradient measurements demonstrated a portosystemic pressure gradient of approximately 7-8 mm Hg. With repeat measurement there did appear to be some additional gradient at the level of the hepatic vein just beyond the stent stented segment near the hepatic vein confluence. Venography did not demonstrate significant stenosis of the vein in this region and this segment of the vein was dilated to 10 mm. It was not felt necessary to place another stent or covered stent to extend the stented segment. IMPRESSION: Successful placement of TIPS shunt between right hepatic vein and right portal vein with placement of 10 mm diameter Viatorr covered stent dilated to 10 mm. The left gastric vein and 2 additional trunks supplying prominent esophageal varices were also embolized with embolization coils a vascular plug. Electronically Signed   By: Aletta Edouard M.D.   On: 05/16/2020 13:21   DG CHEST PORT 1 VIEW  Result Date: 05/16/2020 CLINICAL DATA:  Hypoxia EXAM: PORTABLE CHEST 1 VIEW COMPARISON:  May 16, 2020 study obtained earlier in the day FINDINGS: Endotracheal tube tip is 1.8 cm above the carina. Central catheter tip is in  the superior vena cava. Nasogastric tube tip and side port are below the diaphragm. No pneumothorax. There is bibasilar atelectasis, more on the left than on the right. Lungs elsewhere are clear. Heart is upper normal in size with pulmonary vascularity normal. No adenopathy. No bone lesions. IMPRESSION: Tube and catheter positions as described without pneumothorax. Bibasilar atelectasis, slightly more on the left than on the right. Lungs elsewhere clear. Stable cardiac silhouette. Electronically Signed   By: Lowella Grip III M.D.   On: 05/16/2020 10:54   DG Chest Port 1 View  Result Date: 05/16/2020 CLINICAL DATA:  Endotracheally intubated. EXAM: PORTABLE CHEST 1 VIEW COMPARISON:  Radiograph yesterday. FINDINGS: Endotracheal tube tip is at the level of the clavicular heads 5.3 cm from the carina. Enteric tube in place with tip below the diaphragm not included in the field of view. Right subclavian central line unchanged. Stable heart size and mediastinal contours. Mild bibasilar atelectasis. No pneumothorax or pleural effusion. IMPRESSION: 1. Endotracheal tube tip at the level of the clavicular heads 5.3 cm from the carina. Enteric tube in place with tip below the diaphragm not included in the field of view. 2. Right central line tip in the SVC. 3. Mild bibasilar atelectasis. Electronically Signed   By: Keith Rake M.D.   On: 05/16/2020 00:33   DG Abd Portable 1V  Result Date: 05/16/2020 CLINICAL DATA:  OG tube placement EXAM: PORTABLE ABDOMEN - 1 VIEW COMPARISON:  CT 05/14/2020 FINDINGS: Transesophageal tube tip terminates in the right upper quadrant likely at the level of the gastric antrum/duodenal bulb with side port beyond the GE junction. Upper abdominal vascular stent and embolization coils are  noted. Telemetry leads overlie the upper abdomen as well. Redemonstration of the diffuse air distended and clustered small bowel in the mid abdomen compatible with a high-grade obstruction seen on  comparison CT. No acute osseous abnormality. Bilateral total hip arthroplasties are noted. IMPRESSION: 1. Transesophageal tube tip terminates in the right upper quadrant likely at the level of the gastric antrum/duodenal bulb. Side port beyond the GE junction. 2. Persistent high-grade small bowel obstruction seen on comparison CT. Electronically Signed   By: Lovena Le M.D.   On: 05/16/2020 01:29   IR EMBO ART  VEN HEMORR LYMPH EXTRAV  INC GUIDE ROADMAPPING  Result Date: 05/16/2020 CLINICAL DATA:  Alcoholic liver disease and acute bleeding esophageal varices. Persistent bleeding despite endoscopic variceal banding and critical illness with hypotension and shock requiring blood transfusion, pressor therapy and intubation. The patient has been emergently transferred from [facility] for a TIPS procedure. EXAM: 1. TRANSJUGULAR INTRAHEPATIC PORTOSYSTEMIC SHUNT PLACEMENT 2. ADDITIONAL CATHETERIZATION OF LEFT GASTRIC VEIN AND TWO ADDITIONAL VARICEAL TRUNKS OFF OF PORTAL CONFLUENCE AND SPLENIC VEIN 3. TRANSCATHETER EMBOLIZATION OF VARICES FOR HEMORRHAGE ASSISTANT: ADAM HENN, M.D. MEDICATIONS: As antibiotic prophylaxis, 2 g IV Ancef was ordered pre-procedure and administered intravenously within one hour of incision. ANESTHESIA/SEDATION: General anesthesia-as administered by the Anesthesia department CONTRAST:  100 mL Omnipaque 300 FLUOROSCOPY TIME:  Fluoroscopy Time: 89 minutes and 30 seconds. 3961 mGy. COMPLICATIONS: None immediate. PROCEDURE: Informed written consent was obtained from the patient's wife after a thorough discussion of the procedural risks, benefits and alternatives. All questions were addressed. Maximal Sterile Barrier Technique was utilized including caps, mask, sterile gowns, sterile gloves, sterile drape, hand hygiene and skin antiseptic. A timeout was performed prior to the initiation of the procedure. The right neck and abdominal wall were prepped with chlorhexidine and draped. Ultrasound  was performed of the liver. Under direct ultrasound guidance, portal vein access was performed in the right lobe with a 21 gauge needle. A guidewire was advanced. A 3 French dilator was then advanced over the wire and into the portal vein. Portal venography was performed through the dilator. Ultrasound was used to confirm patency of the right internal jugular vein. Under direct ultrasound guidance, access of the right internal jugular vein was performed with a 21 gauge needle and micropuncture set. Over a guidewire, venous access was dilated and a 10 French angled sheath advanced. The sheath was positioned in the superior aspect of the inferior vena cava. A 5 French catheter was then used to selectively catheterize the right hepatic vein. The catheter was advanced in the right hepatic vein and selective hepatic venography performed. The 10 French sheath was advanced into the right hepatic vein. A Col[place]into needle and needle sheath were advanced through the 10 French sheath. The needle was then advanced in the liver parenchyma and used to gain access to the level of the right portal vein utilizing the portal 3 [place] dilator as a target for needle puncture. After gaining access into the right portal venous system, a guidewire was advanced. A catheter was then able to be advanced over the guidewire and into the portal vein via hepatic venous access. Portal venography was performed. Over a guidewire, the intraparenchymal tract between the right hepatic vein and right portal vein was then dilated utilizing 5 mm and 7 mm diameter balloons. This allowed advancement of the 10 French sheath into the portal vein. A marking pigtail catheter was then utilized in performing venography to estimate length of covered stent required for shunt placement.  A Gore Viatorr covered stent prosthesis with diameter of 10 mm, covered stent length of 8 cm and uncovered length of 2 cm was chosen for placement. The stent was advanced through  the sheath and deployed. After deployment additional venography was performed. The entire prosthesis was dilated to 8 mm. A 5 French catheter was used to selectively catheterize the left gastric vein. Selective venography was performed. The catheter was further advanced into the vein. Embolization coils were then advanced through the 5 French catheter to the level of varices. The 5 French catheter was used to selectively catheterize an additional variceal trunk off of the splenic vein. Selective venography was performed. A Lantern microcatheter was advanced through the 5 [place] catheter. Multiple Ruby microcoils were than deployed via the microcatheter within varices. An Amplatzer Vascular Plug 4 was deployed through the 5 [place] catheter. A third variceal trunk off of the splenic vein was then catheterized with a 5 French catheter. The Lantern microcatheter was advanced through the 5 [place] catheter. Multiple Ruby microcoils were deployed within varices. Additional venography was performed during embolization to assess flow. Additional venography was performed as well as portosystemic pressure measurements. The TIPS shunt was dilated to 10 mm. Additional pressure measurements were made. The 3 French portal vein dilator was removed and a dressing applied at the abdominal exit site. The 10 French sheath was then removed from the right jugular access site and hemostasis obtained with manual compression. A dressing was applied at the exit site. FINDINGS: After portal venous access portal venography demonstrates widely patent main portal vein and intrahepatic left and right portal veins. No evidence of portal vein thrombus. Slow flow is present but flow is towards the liver. After hepatic vein catheterization, the right hepatic vein demonstrates normal patency. After establishing needle access between the right hepatic vein and right portal vein, portal venography demonstrates a large left gastric vein emanating from  the portal confluence and supplying esophageal varices as well as 2 additional trunks emanating from the central aspect of the splenic vein and supplying esophageal varices. A TIPS shunt was successfully created. After covered stent deployment, variceal embolization was performed at the level of 3 separate trunks supplying varices with deployment of embolization coils as well as an Amplatzer Vascular Plug. There was successful diminishment in flow to varices. After TIPS creation and dilatation there was excellent flow through the shunt. Initial pressure gradient measurements demonstrated a portosystemic pressure gradient of approximately 7-8 mm Hg. With repeat measurement there did appear to be some additional gradient at the level of the hepatic vein just beyond the stent stented segment near the hepatic vein confluence. Venography did not demonstrate significant stenosis of the vein in this region and this segment of the vein was dilated to 10 mm. It was not felt necessary to place another stent or covered stent to extend the stented segment. IMPRESSION: Successful placement of TIPS shunt between right hepatic vein and right portal vein with placement of 10 mm diameter Viatorr covered stent dilated to 10 mm. The left gastric vein and 2 additional trunks supplying prominent esophageal varices were also embolized with embolization coils a vascular plug. Electronically Signed   By: Aletta Edouard M.D.   On: 05/16/2020 13:21    Labs:  CBC: Recent Labs    05/16/20 1430 05/16/20 1824 05/16/20 2230 05/17/20 0453  WBC 19.2* 23.5* 21.2* 15.5*  HGB 8.9* 8.9* 9.0* 8.5*  HCT 25.7* 27.3* 26.9* 25.6*  PLT 140* 163 152 130*    COAGS:  Recent Labs    05/16/20 0019 05/16/20 1430 05/16/20 1824 05/17/20 0453  INR 2.5*  2.5* 2.0* 1.9* 1.8*  APTT 39*  --   --   --     BMP: Recent Labs    05/16/20 0019 05/16/20 0026 05/16/20 0610 05/17/20 0453  NA 134* 139 134* 140  K 3.1* 2.8* 3.2* 3.1*  CL 99   --  99 105  CO2 19*  --  21* 24  GLUCOSE 125*  --  163* 132*  BUN 40*  --  45* 37*  CALCIUM 6.4*  --  6.5* 7.3*  CREATININE 3.81*  --  3.50* 1.71*  GFRNONAA 18*  --  20* 47*  GFRAA 21*  --  23* 54*    LIVER FUNCTION TESTS: Recent Labs    05/16/20 0019 05/17/20 0453  BILITOT 4.7* 6.4*  AST 109* 354*  ALT 32 82*  ALKPHOS 74 75  PROT 5.2* 5.8*  ALBUMIN 1.9* 2.7*    Assessment and Plan: GI bleed ,s/p successful placement of TIPS 8/19 with 10 mm diameter Viatorr prosthesis. Additional coil and occluder device embolization of variceal trunks supplying esophageal varices; temp 99.5, CXR pending today; WBC 15.5(21.2), HGB 8.5(9.0), PT 20.5, INR 1.8, creat 1.71(3.5), K 3.1- REPLACE; ammonia 132(154)- on lactulose/xifaxan; further plans as per CCM/GI; will need f/u TIPS [place] in 1 month   Electronically Signed: D. Rowe Robert, PA-C 05/17/2020, 10:17 AM   I spent a total of 15 minutes at the the patient's bedside AND on the patient's hospital floor or unit, greater than 50% of which was counseling/coordinating care for transjugular intrahepatic portosystemic shunt    Patient ID: Jimmy Gonzales, male   DOB: Jan 06, 1971, 49 y.o.   MRN: [id number]

## 2020-05-18 ENCOUNTER — Encounter (HOSPITAL_COMMUNITY): Payer: Self-pay | Admitting: Pulmonary Disease

## 2020-05-18 LAB — GLUCOSE, CAPILLARY
Glucose-Capillary: 140 mg/dL — ABNORMAL HIGH (ref 70–99)
Glucose-Capillary: 150 mg/dL — ABNORMAL HIGH (ref 70–99)

## 2020-05-18 LAB — CBC
HCT: 27 % — ABNORMAL LOW (ref 39.0–52.0)
HCT: 27.1 % — ABNORMAL LOW (ref 39.0–52.0)
Hemoglobin: 8.9 g/dL — ABNORMAL LOW (ref 13.0–17.0)
Hemoglobin: 8.8 g/dL — ABNORMAL LOW (ref 13.0–17.0)
MCH: 30.4 pg (ref 26.0–34.0)
MCH: 29.9 pg (ref 26.0–34.0)
MCHC: 33 g/dL (ref 30.0–36.0)
MCHC: 32.5 g/dL (ref 30.0–36.0)
MCV: 92.2 fL (ref 80.0–100.0)
MCV: 92.2 fL (ref 80.0–100.0)
Platelets: 122 10*3/uL — ABNORMAL LOW (ref 150–400)
Platelets: 123 10*3/uL — ABNORMAL LOW (ref 150–400)
RBC: 2.93 MIL/uL — ABNORMAL LOW (ref 4.22–5.81)
RBC: 2.94 MIL/uL — ABNORMAL LOW (ref 4.22–5.81)
RDW: 17.5 % — ABNORMAL HIGH (ref 11.5–15.5)
RDW: 17.5 % — ABNORMAL HIGH (ref 11.5–15.5)
WBC: 11.6 10*3/uL — ABNORMAL HIGH (ref 4.0–10.5)
WBC: 12.2 10*3/uL — ABNORMAL HIGH (ref 4.0–10.5)
nRBC: 0.3 % — ABNORMAL HIGH (ref 0.0–0.2)
nRBC: 0.2 % (ref 0.0–0.2)

## 2020-05-18 LAB — PROTIME-INR
INR: 1.6 — ABNORMAL HIGH (ref 0.8–1.2)
INR: 1.5 — ABNORMAL HIGH (ref 0.8–1.2)
Prothrombin Time: 18.8 seconds — ABNORMAL HIGH (ref 11.4–15.2)
Prothrombin Time: 17.9 seconds — ABNORMAL HIGH (ref 11.4–15.2)

## 2020-05-18 LAB — COMPREHENSIVE METABOLIC PANEL
ALT: 95 U/L — ABNORMAL HIGH (ref 0–44)
AST: 332 U/L — ABNORMAL HIGH (ref 15–41)
Albumin: 2.9 g/dL — ABNORMAL LOW (ref 3.5–5.0)
Alkaline Phosphatase: 109 U/L (ref 38–126)
Anion gap: 12 (ref 5–15)
BUN: 24 mg/dL — ABNORMAL HIGH (ref 6–20)
CO2: 25 mmol/L (ref 22–32)
Calcium: 7.8 mg/dL — ABNORMAL LOW (ref 8.9–10.3)
Chloride: 106 mmol/L (ref 98–111)
Creatinine, Ser: 0.98 mg/dL (ref 0.61–1.24)
GFR calc Af Amer: >60 mL/min (ref >60)
GFR calc non Af Amer: >60 mL/min (ref >60)
Glucose, Bld: 147 mg/dL — ABNORMAL HIGH (ref 70–99)
Potassium: 3.6 mmol/L (ref 3.5–5.1)
Sodium: 143 mmol/L (ref 135–145)
Total Bilirubin: 6.7 mg/dL — ABNORMAL HIGH (ref 0.3–1.2)
Total Protein: 6.3 g/dL — ABNORMAL LOW (ref 6.5–8.1)

## 2020-05-18 LAB — MAGNESIUM
Magnesium: 2.4 mg/dL (ref 1.7–2.4)
Magnesium: 2.5 mg/dL — ABNORMAL HIGH (ref 1.7–2.4)
Magnesium: 2.3 mg/dL (ref 1.7–2.4)

## 2020-05-18 LAB — PHOSPHORUS
Phosphorus: 2.3 mg/dL — ABNORMAL LOW (ref 2.5–4.6)
Phosphorus: 2.4 mg/dL — ABNORMAL LOW (ref 2.5–4.6)

## 2020-05-18 MED ORDER — POTASSIUM CHLORIDE 20 MEQ/15ML (10%) PO SOLN
40.0000 meq | Freq: Once | ORAL | Status: DC
  Filled 2020-05-18: qty 30

## 2020-05-18 MED ORDER — SODIUM CHLORIDE 0.9 % IV SOLN
1.0000 g | INTRAVENOUS | Status: DC
  Administered 2020-05-18: 1 g via INTRAVENOUS
  Filled 2020-05-18: qty 1
  Filled 2020-05-18: qty 10

## 2020-05-18 MED ORDER — PANTOPRAZOLE SODIUM 40 MG IV SOLR
40.0000 mg | Freq: Two times a day (BID) | INTRAVENOUS | Status: DC
  Administered 2020-05-18 – 2020-05-19 (×3): 40 mg via INTRAVENOUS
  Filled 2020-05-18 (×3): qty 40

## 2020-05-18 MED ORDER — SODIUM CHLORIDE 0.9% FLUSH
10.0000 mL | Freq: Two times a day (BID) | INTRAVENOUS | Status: DC
  Administered 2020-05-19 – 2020-05-26 (×12): 10 mL

## 2020-05-18 MED ORDER — PROSOURCE TF PO LIQD
45.0000 mL | Freq: Two times a day (BID) | ORAL | Status: DC
  Administered 2020-05-18 – 2020-05-19 (×2): 45 mL
  Filled 2020-05-18 (×2): qty 45

## 2020-05-18 MED ORDER — FUROSEMIDE 10 MG/ML IJ SOLN
40.0000 mg | Freq: Four times a day (QID) | INTRAMUSCULAR | Status: AC
  Administered 2020-05-18 (×2): 40 mg via INTRAVENOUS
  Filled 2020-05-18 (×2): qty 4

## 2020-05-18 MED ORDER — SODIUM CHLORIDE 0.9% FLUSH: 10.0000 mL | INTRAVENOUS | Status: DC | PRN

## 2020-05-18 MED ORDER — VITAL HIGH PROTEIN PO LIQD
1000.0000 mL | ORAL | Status: DC
  Administered 2020-05-18: 1000 mL

## 2020-05-18 MED ORDER — POTASSIUM CHLORIDE 20 MEQ/15ML (10%) PO SOLN
40.0000 meq | Freq: Once | ORAL | Status: AC
  Administered 2020-05-18: 40 meq

## 2020-05-18 NOTE — Progress Notes (Signed)
NAME:  Jimmy Gonzales, MRN:  [id number], DOB:  1971-06-29, LOS: 3 ADMISSION DATE:  05/15/2020, CONSULTATION DATE: May 15, 2020 REFERRING MD: Dr. Kathlene Cote, CHIEF COMPLAINT: Vomiting blood  Brief History   49 year old male with a past medical history significant for heavy alcohol abuse presented to [facility] with hematemesis, developed hemorrhagic shock, GI unable to control bleeding with endoscopy.  Transferred to [facility] for emergent TIPS placement.  History of present illness   This is a 49 year old male who has a past medical history significant for drinking 1/2 gallon of hard liquor a day who presented to [facility] with a chief complaint of hematemesis on August 18.  Apparently he had developed abdominal pain and nausea and vomiting which was initially nonbilious nonbloody for 2 days prior to admission.  He was on the way to see his physician on August 18 when he suddenly developed hematemesis.  In the emergency department he was noted to have a large ventral hernia which was incarcerated based on CT scanning.  This was easily reduced and he was admitted to the intensive care unit on an octreotide infusion.  However, hematemesis and hematochezia persisted.  He required intubation, central line placement, vasopressor infusion, multiple blood transfusions, and emergent endoscopy.  Unfortunately endoscopy was incomplete as the patient had significant bleeding.  7 bands were placed but esophageal variceal bleeding was unable to be controlled.  Reportedly Covid negative. He was transferred to [facility] for emergent TIPS. 2 units PRBC given during the procedure. PCCM accepted the patient for ICU admission.   Past Medical History  Alcohol abuse  Significant Hospital Events   8/ 18 admitted [facility] 8/ 19 emergent endoscopy, banding of variceal lesions in esophagus, transferred to [facility] for emergent TIPS procedure 8/ 20 continued blood  product administration   Consults:  Interventional radiology  Procedures:  August 19 endotracheal tube August 19 right IJ central venous line August 19 arterial line   Significant Diagnostic Tests:  August 18 CT abdomen > high grade SBO with transition at small umbilical hernia containing a portion on the mid jejunum. Cirrhosis with portal venous hypertension and marked abdominal varices.   Micro Data:  August 19 SARS-CoV-2> negative Hepatitis A IgM ab neg Hepatitis Bs antigen negative Hepatitis B core IGM negative Hepatitis C antibody 0.1  Antimicrobials:  August 19 Zosyn>  Interim history/subjective:   Overall, doing better. More alert. Weaning sedation. Following commands.   Objective   Blood pressure 100/73, pulse 68, temperature 99.1 F (37.3 C), temperature source Axillary, resp. rate 15, height 6' (1.829 m), weight 112.2 kg, SpO2 100 %.    Vent Mode: PRVC FiO2 (%):  [40 %] 40 % Set Rate:  [14 bmp-15 bmp] 15 bmp Vt Set:  [620 mL] 620 mL PEEP:  [5 cmH20] 5 cmH20 Pressure Support:  [5 cmH20] 5 cmH20 Plateau Pressure:  [18 cmH20-19 cmH20] 19 cmH20   Intake/Output Summary (Last 24 hours) at 05/18/2020 1133 Last data filed at 05/18/2020 0900 Gross per 24 hour  Intake 2753.68 ml  Output 5360 ml  Net -2606.32 ml   Filed Weights   05/16/20 0100 05/18/20 0500  Weight: 102.1 kg 112.2 kg    Examination: General: chronically ill appearing male, resting in bed, on life support  HENT: NCAT, sclera jaundiced  Lungs: BL vented breaths  Cardiovascular: RRR s1 s2 Abdomen: mildly distended  Extremities: BL LE edema  Neuro: following commands, moving all 4 extremities Skin: jaundiced   Resolved  Hospital Problem list     Assessment & Plan:   AHRF requiring intubation and mechanical ventilations  In the setting of variceal bleeding and hemorrhagic shock  P: - continue full mechanical vent support  - adult vent procotol  - weaning as tolerated  - decreasing  sedation needs  - fent + precedex   Metabolic encephalopathy, multifactorial  EtOH abuse with risk for DTS -no hx of seizure with prior withdrawals however use was lighter  Acute hepatic encephalopathy P Continue lactulose   Hemorrhagic shock in setting of GIB, coagulopathy  -s/p 2 PRBC, 1 FFP, Vit K, albumin  -8/20 blood in ETT OGT BMS  Acute upper GI Bleed due to esophageal variceal bleeding - s/p emergent TIPS 8/19 P Follow cbc  Transfuse for Hgb <7  Continue Abx for sbp ppx  Stop octreotide  Stop ppi infusion  protonic IV BID   Decompensated alcoholic cirrhosis with portal hypertension: S/p TIPS 8/29 Coagulopathy, hyperbilirubinemia  S/p FFB, Vit K 8/20 overnight Acute alcoholic hepatitis.  MELD 35 P Status post TIPS  Leukocytosis, possible sepsis Elevated lactic Acid P Continue antimicrobials, Zosyn  Acute kidney injury:  -Likely prerenal injury in setting of hemorrhagic shock, certainly at risk HRS however  -Cr 3.5 8/20 P Follow UOP Lasix today for volume removal   Hypocalcemia Hypomagnesemia, improving s/p replacement  Hypokalemia Hyperphosphatemia, improving  P Replete lytes as needed   Incarcerated bowel, reduced at [facility] SBO on imaging  Passing stools  P No surgical intervention at this time. Start trickle [employer]   Best practice:  Diet: NPO Pain/Anxiety/Delirium protocol (if indicated): As above VAP protocol (if indicated): Yes DVT prophylaxis: SCD GI prophylaxis: Protonix  Glucose control: Monitor Mobility: Bedrest Code Status: Full Family Communication: we have updated patients wife on multiple occasions  Disposition: ICU  Labs   CBC: Recent Labs  Lab 05/17/20 0453 05/17/20 1131 05/17/20 1819 05/18/20 0045 05/18/20 0546  WBC 15.5* 14.5* 13.2* 11.6* 12.2*  HGB 8.5* 8.4* 8.6* 8.9* 8.8*  HCT 25.6* 25.2* 26.1* 27.0* 27.1*  MCV 90.1 90.6 90.6 92.2 92.2  PLT 130* 122* 130* 122* 123*    Basic Metabolic Panel: Recent  Labs  Lab 05/16/20 0019 05/16/20 0019 05/16/20 0026 05/16/20 0610 05/16/20 0610 05/16/20 1824 05/16/20 2241 05/17/20 0453 05/17/20 1819 05/18/20 0500  NA 134*  --  139 134*  --   --   --  140  --  143  K 3.1*  --  2.8* 3.2*  --   --   --  3.1*  --  3.6  CL 99  --   --  99  --   --   --  105  --  106  CO2 19*  --   --  21*  --   --   --  24  --  25  GLUCOSE 125*  --   --  163*  --   --   --  132*  --  147*  BUN 40*  --   --  45*  --   --   --  37*  --  24*  CREATININE 3.81*  --   --  3.50*  --   --   --  1.71*  --  0.98  CALCIUM 6.4*  --   --  6.5*  --   --   --  7.3*  --  7.8*  MG 1.1*   < >  --  2.1   < > 1.9  2.1 2.3 2.5* 2.4  PHOS 7.7*  --   --  4.9*  --   --   --   --   --   --    < > = values in this interval not displayed.   GFR: Estimated Creatinine Clearance: 120.5 mL/min (by C-G formula based on SCr of 0.98 mg/dL). Recent Labs  Lab 05/16/20 0018 05/16/20 0019 05/16/20 0610 05/16/20 1430 05/17/20 1131 05/17/20 1819 05/18/20 0045 05/18/20 0546  WBC  --    < > 23.8*   < > 14.5* 13.2* 11.6* 12.2*  LATICACIDVEN 5.6*  --  3.0*  --   --   --   --   --    < > = values in this interval not displayed.    Liver Function Tests: Recent Labs  Lab 05/16/20 0019 05/17/20 0453 05/18/20 0500  AST 109* 354* 332*  ALT 32 82* 95*  ALKPHOS 74 75 109  BILITOT 4.7* 6.4* 6.7*  PROT 5.2* 5.8* 6.3*  ALBUMIN 1.9* 2.7* 2.9*   No results for input(s): LIPASE, AMYLASE in the last 168 hours. Recent Labs  Lab 05/16/20 0208 05/17/20 0453  AMMONIA 154* 132*    ABG    Component Value Date/Time   PHART 7.326 (L) 05/16/2020 0026   PCO2ART 38.8 05/16/2020 0026   PO2ART 112 (H) 05/16/2020 0026   HCO3 20.2 05/16/2020 0026   TCO2 21 (L) 05/16/2020 0026   ACIDBASEDEF 5.0 (H) 05/16/2020 0026   O2SAT 98.0 05/16/2020 0026     Coagulation Profile: Recent Labs  Lab 05/16/20 1430 05/16/20 1824 05/17/20 0453 05/17/20 1819 05/18/20 0500  INR 2.0* 1.9* 1.8* 1.7* 1.6*     Cardiac Enzymes: No results for input(s): CKTOTAL, CKMB, CKMBINDEX, TROPONINI in the last 168 hours.  HbA1C: No results found for: HGBA1C  CBG: No results for input(s): GLUCAP in the last 168 hours.   This patient is critically ill with multiple organ system failure; which, requires frequent high complexity decision making, assessment, support, evaluation, and titration of therapies. This was completed through the application of advanced monitoring technologies and extensive interpretation of multiple databases. During this encounter critical care time was devoted to patient care services described in this note for 36 minutes.  Garner Nash, DO [facility] 05/18/2020 11:33 AM

## 2020-05-18 NOTE — Plan of Care (Signed)
  Problem: Activity: Goal: Ability to tolerate increased activity will improve Outcome: Progressing   Problem: Respiratory: Goal: Ability to maintain a clear airway and adequate ventilation will improve Outcome: Progressing   Problem: Role Relationship: Goal: Method of communication will improve Outcome: Progressing   Problem: Education: Goal: Ability to identify signs and symptoms of gastrointestinal bleeding will improve Outcome: Progressing   Problem: Bowel/Gastric: Goal: Will show no signs and symptoms of gastrointestinal bleeding Outcome: Progressing   Problem: Fluid Volume: Goal: Will show no signs and symptoms of excessive bleeding Outcome: Progressing   Problem: Clinical Measurements: Goal: Complications related to the disease process, condition or treatment will be avoided or minimized Outcome: Progressing

## 2020-05-18 NOTE — Progress Notes (Signed)
[place] Progress Note Patient Name: Jimmy Gonzales DOB: [birth year] MRN: [id number]   Date of Service  05/18/2020  HPI/Events of Note  Urinary retention - Bladder scan > 999 mL. Has been I/O cath several times in last 24 hours.   eICU Interventions       Intervention Category Major Interventions: Other:  Goldia Ligman Cornelia Copa 05/18/2020, 1:24 AM

## 2020-05-18 NOTE — Progress Notes (Signed)
Pharmacy Antibiotic Note  Jimmy Gonzales is a 49 y.o. male admitted on 05/15/2020 with SBP prophx.  Pharmacy has been consulted for Zosyn dosing.   ID: Zosyn for intra-abdominal infx + SBP treatment  Tmax 99.5. WBC 12.2, LA 5.6>3, Scr now <1  Zosyn 8/19>>  8/21: MRSA PCR: negative   Plan: Zosyn 3.375 gm IV Q 8 hours day #4    Height: 6' (182.9 cm) Weight: 112.2 kg (247 lb 5.7 oz) IBW/kg (Calculated) : 77.6  Temp (24hrs), Avg:99 F (37.2 C), Min:98.6 F (37 C), Max:99.2 F (37.3 C)  Recent Labs  Lab 05/16/20 0018 05/16/20 0019 05/16/20 0019 05/16/20 0610 05/16/20 1430 05/17/20 0453 05/17/20 1131 05/17/20 1819 05/18/20 0045 05/18/20 0500 05/18/20 0546  WBC  --  23.8*   < > 23.8*   < > 15.5* 14.5* 13.2* 11.6*  --  12.2*  CREATININE  --  3.81*  --  3.50*  --  1.71*  --   --   --  0.98  --   LATICACIDVEN 5.6*  --   --  3.0*  --   --   --   --   --   --   --    < > = values in this interval not displayed.    Estimated Creatinine Clearance: 120.5 mL/min (by C-G formula based on SCr of 0.98 mg/dL).    No Known Allergies  Evany Schecter S. Alford Highland, PharmD, BCPS Clinical Staff Pharmacist [email or web address]  Wayland Salinas 05/18/2020 10:36 AM

## 2020-05-19 DIAGNOSIS — K7460 Unspecified cirrhosis of liver: Secondary | ICD-10-CM

## 2020-05-19 DIAGNOSIS — K7290 Hepatic failure, unspecified without coma: Secondary | ICD-10-CM

## 2020-05-19 LAB — COMPREHENSIVE METABOLIC PANEL
ALT: 85 U/L — ABNORMAL HIGH (ref 0–44)
AST: 220 U/L — ABNORMAL HIGH (ref 15–41)
Albumin: 2.9 g/dL — ABNORMAL LOW (ref 3.5–5.0)
Alkaline Phosphatase: 144 U/L — ABNORMAL HIGH (ref 38–126)
Anion gap: 13 (ref 5–15)
BUN: 24 mg/dL — ABNORMAL HIGH (ref 6–20)
CO2: 28 mmol/L (ref 22–32)
Calcium: 8.2 mg/dL — ABNORMAL LOW (ref 8.9–10.3)
Chloride: 107 mmol/L (ref 98–111)
Creatinine, Ser: 0.91 mg/dL (ref 0.61–1.24)
GFR calc Af Amer: >60 mL/min (ref >60)
GFR calc non Af Amer: >60 mL/min (ref >60)
Glucose, Bld: 156 mg/dL — ABNORMAL HIGH (ref 70–99)
Potassium: 3.6 mmol/L (ref 3.5–5.1)
Sodium: 148 mmol/L — ABNORMAL HIGH (ref 135–145)
Total Bilirubin: 5.3 mg/dL — ABNORMAL HIGH (ref 0.3–1.2)
Total Protein: 6.6 g/dL (ref 6.5–8.1)

## 2020-05-19 LAB — BPAM RBC
Blood Product Expiration Date: 202109202359
Blood Product Expiration Date: 202109202359
Blood Product Expiration Date: 202109202359
Blood Product Expiration Date: 202109212359
ISSUE DATE / TIME: 202108191913
ISSUE DATE / TIME: 202108191913
ISSUE DATE / TIME: 202108200937
Unit Type and Rh: 5100
Unit Type and Rh: 5100
Unit Type and Rh: 5100
Unit Type and Rh: 5100

## 2020-05-19 LAB — CBC
HCT: 29.4 % — ABNORMAL LOW (ref 39.0–52.0)
Hemoglobin: 9.3 g/dL — ABNORMAL LOW (ref 13.0–17.0)
MCH: 29.2 pg (ref 26.0–34.0)
MCHC: 31.6 g/dL (ref 30.0–36.0)
MCV: 92.5 fL (ref 80.0–100.0)
Platelets: 164 10*3/uL (ref 150–400)
RBC: 3.18 MIL/uL — ABNORMAL LOW (ref 4.22–5.81)
RDW: 17.2 % — ABNORMAL HIGH (ref 11.5–15.5)
WBC: 14.6 10*3/uL — ABNORMAL HIGH (ref 4.0–10.5)
nRBC: 0.1 % (ref 0.0–0.2)

## 2020-05-19 LAB — TYPE AND SCREEN
ABO/RH(D): O POS
Antibody Screen: NEGATIVE
Unit division: 0
Unit division: 0
Unit division: 0
Unit division: 0

## 2020-05-19 LAB — GLUCOSE, CAPILLARY
Glucose-Capillary: 111 mg/dL — ABNORMAL HIGH (ref 70–99)
Glucose-Capillary: 120 mg/dL — ABNORMAL HIGH (ref 70–99)
Glucose-Capillary: 125 mg/dL — ABNORMAL HIGH (ref 70–99)
Glucose-Capillary: 130 mg/dL — ABNORMAL HIGH (ref 70–99)
Glucose-Capillary: 135 mg/dL — ABNORMAL HIGH (ref 70–99)
Glucose-Capillary: 165 mg/dL — ABNORMAL HIGH (ref 70–99)
Glucose-Capillary: 178 mg/dL — ABNORMAL HIGH (ref 70–99)

## 2020-05-19 LAB — PHOSPHORUS
Phosphorus: 3.4 mg/dL (ref 2.5–4.6)
Phosphorus: 2.9 mg/dL (ref 2.5–4.6)

## 2020-05-19 LAB — MAGNESIUM
Magnesium: 2.2 mg/dL (ref 1.7–2.4)
Magnesium: 2.1 mg/dL (ref 1.7–2.4)

## 2020-05-19 LAB — TRIGLYCERIDES: Triglycerides: 361 mg/dL — ABNORMAL HIGH (ref <150)

## 2020-05-19 MED ORDER — PROSOURCE TF PO LIQD: 45.0000 mL | Freq: Three times a day (TID) | ORAL | Status: DC

## 2020-05-19 MED ORDER — SODIUM CHLORIDE 0.9 % IV SOLN
2.0000 g | INTRAVENOUS | Status: DC
  Administered 2020-05-19: 2 g via INTRAVENOUS
  Filled 2020-05-19: qty 2
  Filled 2020-05-19: qty 20

## 2020-05-19 MED ORDER — RIFAXIMIN 550 MG PO TABS
550.0000 mg | ORAL_TABLET | Freq: Two times a day (BID) | ORAL | Status: DC
  Administered 2020-05-19 – 2020-05-26 (×14): 550 mg via ORAL
  Filled 2020-05-19 (×14): qty 1

## 2020-05-19 MED ORDER — ORAL CARE MOUTH RINSE: 15.0000 mL | Freq: Two times a day (BID) | OROMUCOSAL | Status: DC

## 2020-05-19 MED ORDER — FREE WATER
200.0000 mL | Freq: Four times a day (QID) | Status: DC
  Administered 2020-05-19: 200 mL

## 2020-05-19 MED ORDER — LACTULOSE 10 GM/15ML PO SOLN
30.0000 g | Freq: Four times a day (QID) | ORAL | Status: DC
  Administered 2020-05-19: 30 g via ORAL
  Filled 2020-05-19: qty 45

## 2020-05-19 MED ORDER — PIVOT 1.5 CAL PO LIQD: 1000.0000 mL | ORAL | Status: DC

## 2020-05-19 NOTE — Progress Notes (Signed)
Supervising Physician: Jacqulynn Cadet  Patient Status:  Jim[facility] Gonzales - In-pt  Chief Complaint: GI bleed  Subjective:  S/p TIPS, additional coil and occluder device embolization of variceal trunks supplying the esophageal varices.   Remains intubated. Requiring some sedation for agitation.  Procedure sites intact.  HgB stable at 8.8   Allergies: Patient has no known allergies.  Medications: Prior to Admission medications   Not on File     Vital Signs: BP 133/77   Pulse 72   Temp 100 F (37.8 C) (Axillary)   Resp 15   Ht 6' (1.829 m)   Wt 247 lb 5.7 oz (112.2 kg)   SpO2 97%   BMI 33.55 kg/m   Physical Exam  NAD, vent/sedated Neck: R IJ procedure site intact.  Abdomen: Soft, mildly distended.  Flexiseal with dark, liquid output.  RUQ procedure site intact.   Imaging: IR Angiogram Selective Each Additional Vessel  Result Date: 05/16/2020 CLINICAL DATA:  Alcoholic liver disease and acute bleeding esophageal varices. Persistent bleeding despite endoscopic variceal banding and critical illness with hypotension and shock requiring blood transfusion, pressor therapy and intubation. The patient has been emergently transferred from [facility] for a TIPS procedure. EXAM: 1. TRANSJUGULAR INTRAHEPATIC PORTOSYSTEMIC SHUNT PLACEMENT 2. ADDITIONAL CATHETERIZATION OF LEFT GASTRIC VEIN AND TWO ADDITIONAL VARICEAL TRUNKS OFF OF PORTAL CONFLUENCE AND SPLENIC VEIN 3. TRANSCATHETER EMBOLIZATION OF VARICES FOR HEMORRHAGE ASSISTANT: ADAM HENN, M.D. MEDICATIONS: As antibiotic prophylaxis, 2 g IV Ancef was ordered pre-procedure and administered intravenously within one hour of incision. ANESTHESIA/SEDATION: General anesthesia-as administered by the Anesthesia department CONTRAST:  100 mL Omnipaque 300 FLUOROSCOPY TIME:  Fluoroscopy Time: 89 minutes and 30 seconds. 3961 mGy. COMPLICATIONS: None immediate. PROCEDURE: Informed written consent was obtained from the patient's wife after a  thorough discussion of the procedural risks, benefits and alternatives. All questions were addressed. Maximal Sterile Barrier Technique was utilized including caps, mask, sterile gowns, sterile gloves, sterile drape, hand hygiene and skin antiseptic. A timeout was performed prior to the initiation of the procedure. The right neck and abdominal wall were prepped with chlorhexidine and draped. Ultrasound was performed of the liver. Under direct ultrasound guidance, portal vein access was performed in the right lobe with a 21 gauge needle. A guidewire was advanced. A 3 French dilator was then advanced over the wire and into the portal vein. Portal venography was performed through the dilator. Ultrasound was used to confirm patency of the right internal jugular vein. Under direct ultrasound guidance, access of the right internal jugular vein was performed with a 21 gauge needle and micropuncture set. Over a guidewire, venous access was dilated and a 10 French angled sheath advanced. The sheath was positioned in the superior aspect of the inferior vena cava. A 5 French catheter was then used to selectively catheterize the right hepatic vein. The catheter was advanced in the right hepatic vein and selective hepatic venography performed. The 10 French sheath was advanced into the right hepatic vein. A Colapinto needle and needle sheath were advanced through the 10 French sheath. The needle was then advanced in the liver parenchyma and used to gain access to the level of the right portal vein utilizing the portal 3 [place] dilator as a target for needle puncture. After gaining access into the right portal venous system, a guidewire was advanced. A catheter was then able to be advanced over the guidewire and into the portal vein via hepatic venous access. Portal venography was performed. Over a guidewire, the intraparenchymal tract  between the right hepatic vein and right portal vein was then dilated utilizing 5 mm and 7 mm  diameter balloons. This allowed advancement of the 10 French sheath into the portal vein. A marking pigtail catheter was then utilized in performing venography to estimate length of covered stent required for shunt placement. A Gore Viatorr covered stent prosthesis with diameter of 10 mm, covered stent length of 8 cm and uncovered length of 2 cm was chosen for placement. The stent was advanced through the sheath and deployed. After deployment additional venography was performed. The entire prosthesis was dilated to 8 mm. A 5 French catheter was used to selectively catheterize the left gastric vein. Selective venography was performed. The catheter was further advanced into the vein. Embolization coils were then advanced through the 5 French catheter to the level of varices. The 5 French catheter was used to selectively catheterize an additional variceal trunk off of the splenic vein. Selective venography was performed. A Lantern microcatheter was advanced through the 5 [place] catheter. Multiple Ruby microcoils were than deployed via the microcatheter within varices. An Amplatzer Vascular Plug 4 was deployed through the 5 [place] catheter. A third variceal trunk off of the splenic vein was then catheterized with a 5 French catheter. The Lantern microcatheter was advanced through the 5 [place] catheter. Multiple Ruby microcoils were deployed within varices. Additional venography was performed during embolization to assess flow. Additional venography was performed as well as portosystemic pressure measurements. The TIPS shunt was dilated to 10 mm. Additional pressure measurements were made. The 3 French portal vein dilator was removed and a dressing applied at the abdominal exit site. The 10 French sheath was then removed from the right jugular access site and hemostasis obtained with manual compression. A dressing was applied at the exit site. FINDINGS: After portal venous access portal venography demonstrates widely  patent main portal vein and intrahepatic left and right portal veins. No evidence of portal vein thrombus. Slow flow is present but flow is towards the liver. After hepatic vein catheterization, the right hepatic vein demonstrates normal patency. After establishing needle access between the right hepatic vein and right portal vein, portal venography demonstrates a large left gastric vein emanating from the portal confluence and supplying esophageal varices as well as 2 additional trunks emanating from the central aspect of the splenic vein and supplying esophageal varices. A TIPS shunt was successfully created. After covered stent deployment, variceal embolization was performed at the level of 3 separate trunks supplying varices with deployment of embolization coils as well as an Amplatzer Vascular Plug. There was successful diminishment in flow to varices. After TIPS creation and dilatation there was excellent flow through the shunt. Initial pressure gradient measurements demonstrated a portosystemic pressure gradient of approximately 7-8 mm Hg. With repeat measurement there did appear to be some additional gradient at the level of the hepatic vein just beyond the stent stented segment near the hepatic vein confluence. Venography did not demonstrate significant stenosis of the vein in this region and this segment of the vein was dilated to 10 mm. It was not felt necessary to place another stent or covered stent to extend the stented segment. IMPRESSION: Successful placement of TIPS shunt between right hepatic vein and right portal vein with placement of 10 mm diameter Viatorr covered stent dilated to 10 mm. The left gastric vein and 2 additional trunks supplying prominent esophageal varices were also embolized with embolization coils a vascular plug. Electronically Signed   By: Jenness Corner.D.  On: 05/16/2020 13:21   IR Angiogram Selective Each Additional Vessel  Result Date: 05/16/2020 CLINICAL DATA:   Alcoholic liver disease and acute bleeding esophageal varices. Persistent bleeding despite endoscopic variceal banding and critical illness with hypotension and shock requiring blood transfusion, pressor therapy and intubation. The patient has been emergently transferred from [facility] for a TIPS procedure. EXAM: 1. TRANSJUGULAR INTRAHEPATIC PORTOSYSTEMIC SHUNT PLACEMENT 2. ADDITIONAL CATHETERIZATION OF LEFT GASTRIC VEIN AND TWO ADDITIONAL VARICEAL TRUNKS OFF OF PORTAL CONFLUENCE AND SPLENIC VEIN 3. TRANSCATHETER EMBOLIZATION OF VARICES FOR HEMORRHAGE ASSISTANT: ADAM HENN, M.D. MEDICATIONS: As antibiotic prophylaxis, 2 g IV Ancef was ordered pre-procedure and administered intravenously within one hour of incision. ANESTHESIA/SEDATION: General anesthesia-as administered by the Anesthesia department CONTRAST:  100 mL Omnipaque 300 FLUOROSCOPY TIME:  Fluoroscopy Time: 89 minutes and 30 seconds. 3961 mGy. COMPLICATIONS: None immediate. PROCEDURE: Informed written consent was obtained from the patient's wife after a thorough discussion of the procedural risks, benefits and alternatives. All questions were addressed. Maximal Sterile Barrier Technique was utilized including caps, mask, sterile gowns, sterile gloves, sterile drape, hand hygiene and skin antiseptic. A timeout was performed prior to the initiation of the procedure. The right neck and abdominal wall were prepped with chlorhexidine and draped. Ultrasound was performed of the liver. Under direct ultrasound guidance, portal vein access was performed in the right lobe with a 21 gauge needle. A guidewire was advanced. A 3 French dilator was then advanced over the wire and into the portal vein. Portal venography was performed through the dilator. Ultrasound was used to confirm patency of the right internal jugular vein. Under direct ultrasound guidance, access of the right internal jugular vein was performed with a 21 gauge needle and micropuncture set. Over  a guidewire, venous access was dilated and a 10 French angled sheath advanced. The sheath was positioned in the superior aspect of the inferior vena cava. A 5 French catheter was then used to selectively catheterize the right hepatic vein. The catheter was advanced in the right hepatic vein and selective hepatic venography performed. The 10 French sheath was advanced into the right hepatic vein. A Colapinto needle and needle sheath were advanced through the 10 French sheath. The needle was then advanced in the liver parenchyma and used to gain access to the level of the right portal vein utilizing the portal 3 [place] dilator as a target for needle puncture. After gaining access into the right portal venous system, a guidewire was advanced. A catheter was then able to be advanced over the guidewire and into the portal vein via hepatic venous access. Portal venography was performed. Over a guidewire, the intraparenchymal tract between the right hepatic vein and right portal vein was then dilated utilizing 5 mm and 7 mm diameter balloons. This allowed advancement of the 10 French sheath into the portal vein. A marking pigtail catheter was then utilized in performing venography to estimate length of covered stent required for shunt placement. A Gore Viatorr covered stent prosthesis with diameter of 10 mm, covered stent length of 8 cm and uncovered length of 2 cm was chosen for placement. The stent was advanced through the sheath and deployed. After deployment additional venography was performed. The entire prosthesis was dilated to 8 mm. A 5 French catheter was used to selectively catheterize the left gastric vein. Selective venography was performed. The catheter was further advanced into the vein. Embolization coils were then advanced through the 5 French catheter to the level of varices. The 5 French catheter was  used to selectively catheterize an additional variceal trunk off of the splenic vein. Selective venography  was performed. A Lantern microcatheter was advanced through the 5 [place] catheter. Multiple Ruby microcoils were than deployed via the microcatheter within varices. An Amplatzer Vascular Plug 4 was deployed through the 5 [place] catheter. A third variceal trunk off of the splenic vein was then catheterized with a 5 French catheter. The Lantern microcatheter was advanced through the 5 [place] catheter. Multiple Ruby microcoils were deployed within varices. Additional venography was performed during embolization to assess flow. Additional venography was performed as well as portosystemic pressure measurements. The TIPS shunt was dilated to 10 mm. Additional pressure measurements were made. The 3 French portal vein dilator was removed and a dressing applied at the abdominal exit site. The 10 French sheath was then removed from the right jugular access site and hemostasis obtained with manual compression. A dressing was applied at the exit site. FINDINGS: After portal venous access portal venography demonstrates widely patent main portal vein and intrahepatic left and right portal veins. No evidence of portal vein thrombus. Slow flow is present but flow is towards the liver. After hepatic vein catheterization, the right hepatic vein demonstrates normal patency. After establishing needle access between the right hepatic vein and right portal vein, portal venography demonstrates a large left gastric vein emanating from the portal confluence and supplying esophageal varices as well as 2 additional trunks emanating from the central aspect of the splenic vein and supplying esophageal varices. A TIPS shunt was successfully created. After covered stent deployment, variceal embolization was performed at the level of 3 separate trunks supplying varices with deployment of embolization coils as well as an Amplatzer Vascular Plug. There was successful diminishment in flow to varices. After TIPS creation and dilatation there was  excellent flow through the shunt. Initial pressure gradient measurements demonstrated a portosystemic pressure gradient of approximately 7-8 mm Hg. With repeat measurement there did appear to be some additional gradient at the level of the hepatic vein just beyond the stent stented segment near the hepatic vein confluence. Venography did not demonstrate significant stenosis of the vein in this region and this segment of the vein was dilated to 10 mm. It was not felt necessary to place another stent or covered stent to extend the stented segment. IMPRESSION: Successful placement of TIPS shunt between right hepatic vein and right portal vein with placement of 10 mm diameter Viatorr covered stent dilated to 10 mm. The left gastric vein and 2 additional trunks supplying prominent esophageal varices were also embolized with embolization coils a vascular plug. Electronically Signed   By: Aletta Edouard M.D.   On: 05/16/2020 13:21   IR Angiogram Selective Each Additional Vessel  Result Date: 05/16/2020 CLINICAL DATA:  Alcoholic liver disease and acute bleeding esophageal varices. Persistent bleeding despite endoscopic variceal banding and critical illness with hypotension and shock requiring blood transfusion, pressor therapy and intubation. The patient has been emergently transferred from [facility] for a TIPS procedure. EXAM: 1. TRANSJUGULAR INTRAHEPATIC PORTOSYSTEMIC SHUNT PLACEMENT 2. ADDITIONAL CATHETERIZATION OF LEFT GASTRIC VEIN AND TWO ADDITIONAL VARICEAL TRUNKS OFF OF PORTAL CONFLUENCE AND SPLENIC VEIN 3. TRANSCATHETER EMBOLIZATION OF VARICES FOR HEMORRHAGE ASSISTANT: ADAM HENN, M.D. MEDICATIONS: As antibiotic prophylaxis, 2 g IV Ancef was ordered pre-procedure and administered intravenously within one hour of incision. ANESTHESIA/SEDATION: General anesthesia-as administered by the Anesthesia department CONTRAST:  100 mL Omnipaque 300 FLUOROSCOPY TIME:  Fluoroscopy Time: 89 minutes and 30 seconds. 3961  mGy. COMPLICATIONS: None immediate.  PROCEDURE: Informed written consent was obtained from the patient's wife after a thorough discussion of the procedural risks, benefits and alternatives. All questions were addressed. Maximal Sterile Barrier Technique was utilized including caps, mask, sterile gowns, sterile gloves, sterile drape, hand hygiene and skin antiseptic. A timeout was performed prior to the initiation of the procedure. The right neck and abdominal wall were prepped with chlorhexidine and draped. Ultrasound was performed of the liver. Under direct ultrasound guidance, portal vein access was performed in the right lobe with a 21 gauge needle. A guidewire was advanced. A 3 French dilator was then advanced over the wire and into the portal vein. Portal venography was performed through the dilator. Ultrasound was used to confirm patency of the right internal jugular vein. Under direct ultrasound guidance, access of the right internal jugular vein was performed with a 21 gauge needle and micropuncture set. Over a guidewire, venous access was dilated and a 10 French angled sheath advanced. The sheath was positioned in the superior aspect of the inferior vena cava. A 5 French catheter was then used to selectively catheterize the right hepatic vein. The catheter was advanced in the right hepatic vein and selective hepatic venography performed. The 10 French sheath was advanced into the right hepatic vein. A Colapinto needle and needle sheath were advanced through the 10 French sheath. The needle was then advanced in the liver parenchyma and used to gain access to the level of the right portal vein utilizing the portal 3 [place] dilator as a target for needle puncture. After gaining access into the right portal venous system, a guidewire was advanced. A catheter was then able to be advanced over the guidewire and into the portal vein via hepatic venous access. Portal venography was performed. Over a guidewire, the  intraparenchymal tract between the right hepatic vein and right portal vein was then dilated utilizing 5 mm and 7 mm diameter balloons. This allowed advancement of the 10 French sheath into the portal vein. A marking pigtail catheter was then utilized in performing venography to estimate length of covered stent required for shunt placement. A Gore Viatorr covered stent prosthesis with diameter of 10 mm, covered stent length of 8 cm and uncovered length of 2 cm was chosen for placement. The stent was advanced through the sheath and deployed. After deployment additional venography was performed. The entire prosthesis was dilated to 8 mm. A 5 French catheter was used to selectively catheterize the left gastric vein. Selective venography was performed. The catheter was further advanced into the vein. Embolization coils were then advanced through the 5 French catheter to the level of varices. The 5 French catheter was used to selectively catheterize an additional variceal trunk off of the splenic vein. Selective venography was performed. A Lantern microcatheter was advanced through the 5 [place] catheter. Multiple Ruby microcoils were than deployed via the microcatheter within varices. An Amplatzer Vascular Plug 4 was deployed through the 5 [place] catheter. A third variceal trunk off of the splenic vein was then catheterized with a 5 French catheter. The Lantern microcatheter was advanced through the 5 [place] catheter. Multiple Ruby microcoils were deployed within varices. Additional venography was performed during embolization to assess flow. Additional venography was performed as well as portosystemic pressure measurements. The TIPS shunt was dilated to 10 mm. Additional pressure measurements were made. The 3 French portal vein dilator was removed and a dressing applied at the abdominal exit site. The 10 French sheath was then removed from the right jugular access  site and hemostasis obtained with manual compression. A  dressing was applied at the exit site. FINDINGS: After portal venous access portal venography demonstrates widely patent main portal vein and intrahepatic left and right portal veins. No evidence of portal vein thrombus. Slow flow is present but flow is towards the liver. After hepatic vein catheterization, the right hepatic vein demonstrates normal patency. After establishing needle access between the right hepatic vein and right portal vein, portal venography demonstrates a large left gastric vein emanating from the portal confluence and supplying esophageal varices as well as 2 additional trunks emanating from the central aspect of the splenic vein and supplying esophageal varices. A TIPS shunt was successfully created. After covered stent deployment, variceal embolization was performed at the level of 3 separate trunks supplying varices with deployment of embolization coils as well as an Amplatzer Vascular Plug. There was successful diminishment in flow to varices. After TIPS creation and dilatation there was excellent flow through the shunt. Initial pressure gradient measurements demonstrated a portosystemic pressure gradient of approximately 7-8 mm Hg. With repeat measurement there did appear to be some additional gradient at the level of the hepatic vein just beyond the stent stented segment near the hepatic vein confluence. Venography did not demonstrate significant stenosis of the vein in this region and this segment of the vein was dilated to 10 mm. It was not felt necessary to place another stent or covered stent to extend the stented segment. IMPRESSION: Successful placement of TIPS shunt between right hepatic vein and right portal vein with placement of 10 mm diameter Viatorr covered stent dilated to 10 mm. The left gastric vein and 2 additional trunks supplying prominent esophageal varices were also embolized with embolization coils a vascular plug. Electronically Signed   By: Aletta Edouard M.D.    On: 05/16/2020 13:21   IR Tips  Result Date: 05/16/2020 CLINICAL DATA:  Alcoholic liver disease and acute bleeding esophageal varices. Persistent bleeding despite endoscopic variceal banding and critical illness with hypotension and shock requiring blood transfusion, pressor therapy and intubation. The patient has been emergently transferred from [facility] for a TIPS procedure. EXAM: 1. TRANSJUGULAR INTRAHEPATIC PORTOSYSTEMIC SHUNT PLACEMENT 2. ADDITIONAL CATHETERIZATION OF LEFT GASTRIC VEIN AND TWO ADDITIONAL VARICEAL TRUNKS OFF OF PORTAL CONFLUENCE AND SPLENIC VEIN 3. TRANSCATHETER EMBOLIZATION OF VARICES FOR HEMORRHAGE ASSISTANT: ADAM HENN, M.D. MEDICATIONS: As antibiotic prophylaxis, 2 g IV Ancef was ordered pre-procedure and administered intravenously within one hour of incision. ANESTHESIA/SEDATION: General anesthesia-as administered by the Anesthesia department CONTRAST:  100 mL Omnipaque 300 FLUOROSCOPY TIME:  Fluoroscopy Time: 89 minutes and 30 seconds. 3961 mGy. COMPLICATIONS: None immediate. PROCEDURE: Informed written consent was obtained from the patient's wife after a thorough discussion of the procedural risks, benefits and alternatives. All questions were addressed. Maximal Sterile Barrier Technique was utilized including caps, mask, sterile gowns, sterile gloves, sterile drape, hand hygiene and skin antiseptic. A timeout was performed prior to the initiation of the procedure. The right neck and abdominal wall were prepped with chlorhexidine and draped. Ultrasound was performed of the liver. Under direct ultrasound guidance, portal vein access was performed in the right lobe with a 21 gauge needle. A guidewire was advanced. A 3 French dilator was then advanced over the wire and into the portal vein. Portal venography was performed through the dilator. Ultrasound was used to confirm patency of the right internal jugular vein. Under direct ultrasound guidance, access of the right internal  jugular vein was performed with a 21 gauge needle and  micropuncture set. Over a guidewire, venous access was dilated and a 10 French angled sheath advanced. The sheath was positioned in the superior aspect of the inferior vena cava. A 5 French catheter was then used to selectively catheterize the right hepatic vein. The catheter was advanced in the right hepatic vein and selective hepatic venography performed. The 10 French sheath was advanced into the right hepatic vein. A Colapinto needle and needle sheath were advanced through the 10 French sheath. The needle was then advanced in the liver parenchyma and used to gain access to the level of the right portal vein utilizing the portal 3 [place] dilator as a target for needle puncture. After gaining access into the right portal venous system, a guidewire was advanced. A catheter was then able to be advanced over the guidewire and into the portal vein via hepatic venous access. Portal venography was performed. Over a guidewire, the intraparenchymal tract between the right hepatic vein and right portal vein was then dilated utilizing 5 mm and 7 mm diameter balloons. This allowed advancement of the 10 French sheath into the portal vein. A marking pigtail catheter was then utilized in performing venography to estimate length of covered stent required for shunt placement. A Gore Viatorr covered stent prosthesis with diameter of 10 mm, covered stent length of 8 cm and uncovered length of 2 cm was chosen for placement. The stent was advanced through the sheath and deployed. After deployment additional venography was performed. The entire prosthesis was dilated to 8 mm. A 5 French catheter was used to selectively catheterize the left gastric vein. Selective venography was performed. The catheter was further advanced into the vein. Embolization coils were then advanced through the 5 French catheter to the level of varices. The 5 French catheter was used to selectively  catheterize an additional variceal trunk off of the splenic vein. Selective venography was performed. A Lantern microcatheter was advanced through the 5 [place] catheter. Multiple Ruby microcoils were than deployed via the microcatheter within varices. An Amplatzer Vascular Plug 4 was deployed through the 5 [place] catheter. A third variceal trunk off of the splenic vein was then catheterized with a 5 French catheter. The Lantern microcatheter was advanced through the 5 [place] catheter. Multiple Ruby microcoils were deployed within varices. Additional venography was performed during embolization to assess flow. Additional venography was performed as well as portosystemic pressure measurements. The TIPS shunt was dilated to 10 mm. Additional pressure measurements were made. The 3 French portal vein dilator was removed and a dressing applied at the abdominal exit site. The 10 French sheath was then removed from the right jugular access site and hemostasis obtained with manual compression. A dressing was applied at the exit site. FINDINGS: After portal venous access portal venography demonstrates widely patent main portal vein and intrahepatic left and right portal veins. No evidence of portal vein thrombus. Slow flow is present but flow is towards the liver. After hepatic vein catheterization, the right hepatic vein demonstrates normal patency. After establishing needle access between the right hepatic vein and right portal vein, portal venography demonstrates a large left gastric vein emanating from the portal confluence and supplying esophageal varices as well as 2 additional trunks emanating from the central aspect of the splenic vein and supplying esophageal varices. A TIPS shunt was successfully created. After covered stent deployment, variceal embolization was performed at the level of 3 separate trunks supplying varices with deployment of embolization coils as well as an Amplatzer Vascular Plug. There was  successful diminishment in flow to varices. After TIPS creation and dilatation there was excellent flow through the shunt. Initial pressure gradient measurements demonstrated a portosystemic pressure gradient of approximately 7-8 mm Hg. With repeat measurement there did appear to be some additional gradient at the level of the hepatic vein just beyond the stent stented segment near the hepatic vein confluence. Venography did not demonstrate significant stenosis of the vein in this region and this segment of the vein was dilated to 10 mm. It was not felt necessary to place another stent or covered stent to extend the stented segment. IMPRESSION: Successful placement of TIPS shunt between right hepatic vein and right portal vein with placement of 10 mm diameter Viatorr covered stent dilated to 10 mm. The left gastric vein and 2 additional trunks supplying prominent esophageal varices were also embolized with embolization coils a vascular plug. Electronically Signed   By: Aletta Edouard M.D.   On: 05/16/2020 13:21   DG CHEST PORT 1 VIEW  Result Date: 05/17/2020 CLINICAL DATA:  49 year old intubated male. Evaluate tube and catheter positions. EXAM: PORTABLE CHEST 1 VIEW COMPARISON:  Chest x-ray 05/16/2020. FINDINGS: An endotracheal tube is in place with tip 2.1 cm above the carina. There is a right-sided subclavian central venous catheter with tip terminating in the distal superior vena cava. A nasogastric tube is seen extending into the stomach, however, the tip of the nasogastric tube extends below the lower margin of the image. Lung volumes are low with bibasilar opacities which are favored to reflect areas of subsegmental atelectasis. No definite consolidative airspace disease. No pleural effusions. No evidence of pulmonary edema. No pneumothorax. Heart size is normal. Upper mediastinal contours are within normal limits. Aortic atherosclerosis. IMPRESSION: 1. Support apparatus, as above. 2. Low lung volumes  with bibasilar areas of subsegmental atelectasis. 3. Aortic atherosclerosis. Electronically Signed   By: Vinnie Langton M.D.   On: 05/17/2020 10:35   DG CHEST PORT 1 VIEW  Result Date: 05/16/2020 CLINICAL DATA:  Hypoxia EXAM: PORTABLE CHEST 1 VIEW COMPARISON:  May 16, 2020 study obtained earlier in the day FINDINGS: Endotracheal tube tip is 1.8 cm above the carina. Central catheter tip is in the superior vena cava. Nasogastric tube tip and side port are below the diaphragm. No pneumothorax. There is bibasilar atelectasis, more on the left than on the right. Lungs elsewhere are clear. Heart is upper normal in size with pulmonary vascularity normal. No adenopathy. No bone lesions. IMPRESSION: Tube and catheter positions as described without pneumothorax. Bibasilar atelectasis, slightly more on the left than on the right. Lungs elsewhere clear. Stable cardiac silhouette. Electronically Signed   By: Lowella Grip III M.D.   On: 05/16/2020 10:54   DG Chest Port 1 View  Result Date: 05/16/2020 CLINICAL DATA:  Endotracheally intubated. EXAM: PORTABLE CHEST 1 VIEW COMPARISON:  Radiograph yesterday. FINDINGS: Endotracheal tube tip is at the level of the clavicular heads 5.3 cm from the carina. Enteric tube in place with tip below the diaphragm not included in the field of view. Right subclavian central line unchanged. Stable heart size and mediastinal contours. Mild bibasilar atelectasis. No pneumothorax or pleural effusion. IMPRESSION: 1. Endotracheal tube tip at the level of the clavicular heads 5.3 cm from the carina. Enteric tube in place with tip below the diaphragm not included in the field of view. 2. Right central line tip in the SVC. 3. Mild bibasilar atelectasis. Electronically Signed   By: Keith Rake M.D.   On: 05/16/2020 00:33  DG Abd Portable 1V  Result Date: 05/17/2020 CLINICAL DATA:  Gastric distension EXAM: PORTABLE ABDOMEN - 1 VIEW COMPARISON:  05/16/2020 FINDINGS: Supine frontal  view of the abdomen and pelvis excludes the hemidiaphragms, right flank, and lower pelvis by collimation. TIPS identified right upper quadrant. Embolic coils central upper abdomen. Enteric catheter projects over the gastric antrum. The stomach is markedly distended. Continued small bowel obstruction, with decreased caliber of the distended gas-filled loops of small bowel. No masses or abnormal calcifications. IMPRESSION: 1. Distension of the stomach, with indwelling enteric catheter as above. 2. Persistent small-bowel obstruction, with slight decrease in caliber of the distended small bowel loops. Electronically Signed   By: Randa Ngo M.D.   On: 05/17/2020 20:36   DG Abd Portable 1V  Result Date: 05/16/2020 CLINICAL DATA:  OG tube placement EXAM: PORTABLE ABDOMEN - 1 VIEW COMPARISON:  CT 05/14/2020 FINDINGS: Transesophageal tube tip terminates in the right upper quadrant likely at the level of the gastric antrum/duodenal bulb with side port beyond the GE junction. Upper abdominal vascular stent and embolization coils are noted. Telemetry leads overlie the upper abdomen as well. Redemonstration of the diffuse air distended and clustered small bowel in the mid abdomen compatible with a high-grade obstruction seen on comparison CT. No acute osseous abnormality. Bilateral total hip arthroplasties are noted. IMPRESSION: 1. Transesophageal tube tip terminates in the right upper quadrant likely at the level of the gastric antrum/duodenal bulb. Side port beyond the GE junction. 2. Persistent high-grade small bowel obstruction seen on comparison CT. Electronically Signed   By: Lovena Le M.D.   On: 05/16/2020 01:29   IR EMBO ART  VEN HEMORR LYMPH EXTRAV  INC GUIDE ROADMAPPING  Result Date: 05/16/2020 CLINICAL DATA:  Alcoholic liver disease and acute bleeding esophageal varices. Persistent bleeding despite endoscopic variceal banding and critical illness with hypotension and shock requiring blood transfusion,  pressor therapy and intubation. The patient has been emergently transferred from [facility] for a TIPS procedure. EXAM: 1. TRANSJUGULAR INTRAHEPATIC PORTOSYSTEMIC SHUNT PLACEMENT 2. ADDITIONAL CATHETERIZATION OF LEFT GASTRIC VEIN AND TWO ADDITIONAL VARICEAL TRUNKS OFF OF PORTAL CONFLUENCE AND SPLENIC VEIN 3. TRANSCATHETER EMBOLIZATION OF VARICES FOR HEMORRHAGE ASSISTANT: ADAM HENN, M.D. MEDICATIONS: As antibiotic prophylaxis, 2 g IV Ancef was ordered pre-procedure and administered intravenously within one hour of incision. ANESTHESIA/SEDATION: General anesthesia-as administered by the Anesthesia department CONTRAST:  100 mL Omnipaque 300 FLUOROSCOPY TIME:  Fluoroscopy Time: 89 minutes and 30 seconds. 3961 mGy. COMPLICATIONS: None immediate. PROCEDURE: Informed written consent was obtained from the patient's wife after a thorough discussion of the procedural risks, benefits and alternatives. All questions were addressed. Maximal Sterile Barrier Technique was utilized including caps, mask, sterile gowns, sterile gloves, sterile drape, hand hygiene and skin antiseptic. A timeout was performed prior to the initiation of the procedure. The right neck and abdominal wall were prepped with chlorhexidine and draped. Ultrasound was performed of the liver. Under direct ultrasound guidance, portal vein access was performed in the right lobe with a 21 gauge needle. A guidewire was advanced. A 3 French dilator was then advanced over the wire and into the portal vein. Portal venography was performed through the dilator. Ultrasound was used to confirm patency of the right internal jugular vein. Under direct ultrasound guidance, access of the right internal jugular vein was performed with a 21 gauge needle and micropuncture set. Over a guidewire, venous access was dilated and a 10 French angled sheath advanced. The sheath was positioned in the superior aspect of the  inferior vena cava. A 5 French catheter was then used to  selectively catheterize the right hepatic vein. The catheter was advanced in the right hepatic vein and selective hepatic venography performed. The 10 French sheath was advanced into the right hepatic vein. A [place] needle and needle sheath were advanced through the 10 French sheath. The needle was then advanced in the liver parenchyma and used to gain access to the level of the right portal vein utilizing the portal 3 [place] dilator as a target for needle puncture. After gaining access into the right portal venous system, a guidewire was advanced. A catheter was then able to be advanced over the guidewire and into the portal vein via hepatic venous access. Portal venography was performed. Over a guidewire, the intraparenchymal tract between the right hepatic vein and right portal vein was then dilated utilizing 5 mm and 7 mm diameter balloons. This allowed advancement of the 10 French sheath into the portal vein. A marking pigtail catheter was then utilized in performing venography to estimate length of covered stent required for shunt placement. A Gore Viatorr covered stent prosthesis with diameter of 10 mm, covered stent length of 8 cm and uncovered length of 2 cm was chosen for placement. The stent was advanced through the sheath and deployed. After deployment additional venography was performed. The entire prosthesis was dilated to 8 mm. A 5 French catheter was used to selectively catheterize the left gastric vein. Selective venography was performed. The catheter was further advanced into the vein. Embolization coils were then advanced through the 5 French catheter to the level of varices. The 5 French catheter was used to selectively catheterize an additional variceal trunk off of the splenic vein. Selective venography was performed. A Lantern microcatheter was advanced through the 5 [place] catheter. Multiple Ruby microcoils were than deployed via the microcatheter within varices. An Amplatzer Vascular Plug  4 was deployed through the 5 [place] catheter. A third variceal trunk off of the splenic vein was then catheterized with a 5 French catheter. The Lantern microcatheter was advanced through the 5 [place] catheter. Multiple Ruby microcoils were deployed within varices. Additional venography was performed during embolization to assess flow. Additional venography was performed as well as portosystemic pressure measurements. The TIPS shunt was dilated to 10 mm. Additional pressure measurements were made. The 3 French portal vein dilator was removed and a dressing applied at the abdominal exit site. The 10 French sheath was then removed from the right jugular access site and hemostasis obtained with manual compression. A dressing was applied at the exit site. FINDINGS: After portal venous access portal venography demonstrates widely patent main portal vein and intrahepatic left and right portal veins. No evidence of portal vein thrombus. Slow flow is present but flow is towards the liver. After hepatic vein catheterization, the right hepatic vein demonstrates normal patency. After establishing needle access between the right hepatic vein and right portal vein, portal venography demonstrates a large left gastric vein emanating from the portal confluence and supplying esophageal varices as well as 2 additional trunks emanating from the central aspect of the splenic vein and supplying esophageal varices. A TIPS shunt was successfully created. After covered stent deployment, variceal embolization was performed at the level of 3 separate trunks supplying varices with deployment of embolization coils as well as an Amplatzer Vascular Plug. There was successful diminishment in flow to varices. After TIPS creation and dilatation there was excellent flow through the shunt. Initial pressure gradient measurements demonstrated a portosystemic pressure  gradient of approximately 7-8 mm Hg. With repeat measurement there did appear to be  some additional gradient at the level of the hepatic vein just beyond the stent stented segment near the hepatic vein confluence. Venography did not demonstrate significant stenosis of the vein in this region and this segment of the vein was dilated to 10 mm. It was not felt necessary to place another stent or covered stent to extend the stented segment. IMPRESSION: Successful placement of TIPS shunt between right hepatic vein and right portal vein with placement of 10 mm diameter Viatorr covered stent dilated to 10 mm. The left gastric vein and 2 additional trunks supplying prominent esophageal varices were also embolized with embolization coils a vascular plug. Electronically Signed   By: Aletta Edouard M.D.   On: 05/16/2020 13:21    Labs:  CBC: Recent Labs    05/17/20 1131 05/17/20 1819 05/18/20 0045 05/18/20 0546  WBC 14.5* 13.2* 11.6* 12.2*  HGB 8.4* 8.6* 8.9* 8.8*  HCT 25.2* 26.1* 27.0* 27.1*  PLT 122* 130* 122* 123*    COAGS: Recent Labs    05/16/20 0019 05/16/20 1430 05/17/20 0453 05/17/20 1819 05/18/20 0500 05/18/20 1820  INR 2.5*  2.5*   < > 1.8* 1.7* 1.6* 1.5*  APTT 39*  --   --   --   --   --    < > = values in this interval not displayed.    BMP: Recent Labs    05/16/20 0610 05/17/20 0453 05/18/20 0500 05/19/20 0551  NA 134* 140 143 148*  K 3.2* 3.1* 3.6 3.6  CL 99 105 106 107  CO2 21* 24 25 28   GLUCOSE 163* 132* 147* 156*  BUN 45* 37* 24* 24*  CALCIUM 6.5* 7.3* 7.8* 8.2*  CREATININE 3.50* 1.71* 0.98 0.91  GFRNONAA 20* 47* >60 >60  GFRAA 23* 54* >60 >60    LIVER FUNCTION TESTS: Recent Labs    05/16/20 0019 05/17/20 0453 05/18/20 0500 05/19/20 0551  BILITOT 4.7* 6.4* 6.7* 5.3*  AST 109* 354* 332* 220*  ALT 32 82* 95* 85*  ALKPHOS 74 75 109 144*  PROT 5.2* 5.8* 6.3* 6.6  ALBUMIN 1.9* 2.7* 2.9* 2.9*    Assessment and Plan: GI bleed s/p successful placement of TIPS with 10 mm diameter Viatorr prosthesis. Additional coil and occluder device  embolization of variceal trunks supplying esophageal varices.  Remains intubated this AM.  Stool dark, but no visible blood.  Hgb 8.8 (stable 8.4-8.9 x 2 days).  SCr has normalized to 0.91.  T bili trending down, now 5.3   Procedure sites intact.  IR available if needed.  Placed outpatient follow-up orders. Wife updates at bedside.   Electronically Signed: Docia Barrier, PA 05/19/2020, 2:22 PM   I spent a total of 15 Minutes at the the patient's bedside AND on the patient's hospital floor or unit, greater than 50% of which was counseling/coordinating care for GI bleed.

## 2020-05-19 NOTE — Progress Notes (Signed)
Nutrition Follow-up  DOCUMENTATION CODES:   Not applicable  INTERVENTION:  Monitor magnesium, potassium, and phosphorus daily for at least 3 days, MD to replete as needed, as pt is at risk for refeeding syndrome.   Via OG tube: -Initiate Pivot 1.5 cal @ 16ml/hr, advance 90ml/hr Q4H until goal rate of 35ml/hr is reached (1363ml/d) -59ml Prosource TF TID -254ml free water Q6H (or per MD/CCM)  TF regimen and propofol at current rate providing 2504 total kcal/day (100% of kcal needs), 156 grams protein, 1051ml free water (1863ml total free water with flushes)   NUTRITION DIAGNOSIS:   Increased nutrient needs related to catabolic illness (liver disease) as evidenced by estimated needs.  Ongoing  GOAL:   Patient will meet greater than or equal to 90% of their needs  Will address with TF   MONITOR:   I & O's  REASON FOR ASSESSMENT:   Consult Enteral/tube feeding initiation and management  ASSESSMENT:   Pt with PMH of heavy ETOH abuse (1/2 gallon whiskey per day) admitted from [place] with uncontrolled bleeding and hemorrhagic shock. Pt s/p emergent endoscopy, banding of variceal lesions in esophagus.  Pt more alert and following commands. Sedation being weaned. Pt passing stool. RD consulted to initiate trickle TFs. Of note, pt had TF protocol initiated over the weekend via OGT (Vital High Protein @40ml /hr with 62ml Prosource TF BID). Per RN, pt is tolerating this well; however, pt's wife reporting pt's abdomen more distended today. NP to assess this.   Patient is currently intubated on ventilator support MV: 9.7 L/min Temp (24hrs), Avg:99.1 F (37.3 C), Min:98.6 F (37 C), Max:99.6 F (37.6 C)  Propofol: 15.32 ml/hr, providing 404 kcals/d  Labs: Na 148 (H), CBGs 135-150 Medications: Chronulac, Solu-medrol, Protonix, Thiamine  Drips: Fentanyl, Propofol  Diet Order:   Diet Order            Diet NPO time specified  Diet effective now                  EDUCATION NEEDS:   No education needs have been identified at this time  Skin:  Skin Assessment: Skin Integrity Issues: Skin Integrity Issues:: Incisions Incisions: R abdomen  Last BM:  8/23 large via rectal tube  Height:   Ht Readings from Last 1 Encounters:  05/15/20 6' (1.829 m)    Weight:   Wt Readings from Last 1 Encounters:  05/18/20 112.2 kg    Ideal Body Weight:  80.9 kg  BMI:  Body mass index is 33.55 kg/m.  Estimated Nutritional Needs:   Kcal:  2500  Protein:  150-165 grams  Fluid:  >2 L/day    Larkin Ina, MS, RD, LDN RD pager number and weekend/on-call pager number located in [place].

## 2020-05-19 NOTE — Progress Notes (Signed)
[facility] Gastroenterology Progress Note  Jimmy Gonzales 49 y.o. [birth year]  CC: Cirrhosis, variceal bleeding s/p TIPS   Subjective: Patient intubated and sedated and unable to provide any subjective data.  ROS : Unable to obtain due to patient status (intubated and sedated)   Objective: Vital signs in last 24 hours: Vitals:   05/19/20 1122 05/19/20 1200  BP:  (!) 141/92  Pulse:  (!) 59  Resp:  15  Temp:  100 F (37.8 C)  SpO2: 97% 95%    Physical Exam:  General:   Intubated and sedated; mittens in place  Head:  Normocephalic, without obvious abnormality, atraumatic  Eyes:  Scleral icterus, EOM's intact,   Lungs:   + Mechanical ventilation  Heart:  Regular rate and rhythm, S1, S2 normal  Abdomen:    Distended but soft and non-tender (no grimace upon palpation), bowel sounds active all four quadrants; rectal tube draining dark brown/black liquid stool  Extremities: Extremities normal, atraumatic, no  edema  Pulses: 2+ and symmetric    Lab Results: Recent Labs    05/18/20 0500 05/18/20 1400 05/18/20 1820 05/19/20 0551  NA 143  --   --  148*  K 3.6  --   --  3.6  CL 106  --   --  107  CO2 25  --   --  28  GLUCOSE 147*  --   --  156*  BUN 24*  --   --  24*  CREATININE 0.98  --   --  0.91  CALCIUM 7.8*  --   --  8.2*  MG 2.4   < > 2.3 2.2  PHOS  --    < > 2.4* 3.4   < > = values in this interval not displayed.   Recent Labs    05/18/20 0500 05/19/20 0551  AST 332* 220*  ALT 95* 85*  ALKPHOS 109 144*  BILITOT 6.7* 5.3*  PROT 6.3* 6.6  ALBUMIN 2.9* 2.9*   Recent Labs    05/18/20 0045 05/18/20 0546  WBC 11.6* 12.2*  HGB 8.9* 8.8*  HCT 27.0* 27.1*  MCV 92.2 92.2  PLT 122* 123*   Recent Labs    05/18/20 0500 05/18/20 1820  LABPROT 18.8* 17.9*  INR 1.6* 1.5*    Impression: Variceal bleeding s/p TIPS 05/16/20 -Hemoglobin 8.6 yesterday, has remained stable -Normotensive with regular heart rate; pressors being weaned  Decompensated cirrhosis,  most likely from alcohol use.  MELD score of 35 as of 05/16/2020.  Hepatic discriminant function 68.6 as of 05/16/2020 -T bili/AST/ALT gradually improving, though ALP gradually rising.  Today, T bili 5.3/AST 220/ALT 85/ALP 144 -INR 1.6 as of yesterday -Ammonia 154  Plan: Continue lactulose 30 g 4 times daily via OG tube.  Continue Xifaxan twice daily.    Continue Protonix 40 mg twice daily.  Continue to monitor H&H with transfusion as needed to maintain hemoglobin greater than 7.   Salley Slaughter PA-C 05/19/2020, 1:12 PM  Contact #  [phone]

## 2020-05-19 NOTE — Progress Notes (Signed)
NAME:  Jimmy Gonzales, MRN:  [id number], DOB:  1971/01/08, LOS: 4 ADMISSION DATE:  05/15/2020, CONSULTATION DATE: May 15, 2020 REFERRING MD: Dr. Kathlene Cote, CHIEF COMPLAINT: Vomiting blood  Brief History   49 year old male with a past medical history significant for heavy alcohol abuse presented to [facility] with hematemesis, developed hemorrhagic shock, GI unable to control bleeding with endoscopy.  Transferred to [facility] for emergent TIPS placement.  History of present illness   This is a 49 year old male who has a past medical history significant for drinking 1/2 gallon of hard liquor a day who presented to [facility] with a chief complaint of hematemesis on August 18.  Apparently he had developed abdominal pain and nausea and vomiting which was initially nonbilious nonbloody for 2 days prior to admission.  He was on the way to see his physician on August 18 when he suddenly developed hematemesis.  In the emergency department he was noted to have a large ventral hernia which was incarcerated based on CT scanning.  This was easily reduced and he was admitted to the intensive care unit on an octreotide infusion.  However, hematemesis and hematochezia persisted.  He required intubation, central line placement, vasopressor infusion, multiple blood transfusions, and emergent endoscopy.  Unfortunately endoscopy was incomplete as the patient had significant bleeding.  7 bands were placed but esophageal variceal bleeding was unable to be controlled.  Reportedly Covid negative. He was transferred to [facility] for emergent TIPS. 2 units PRBC given during the procedure. PCCM accepted the patient for ICU admission.   Past Medical History  Alcohol abuse  Significant Hospital Events   8/ 18 admitted [facility] 8/ 19 emergent endoscopy, banding of variceal lesions in esophagus, transferred to [facility] for emergent TIPS procedure 8/ 20 continued blood  product administration   Consults:  Interventional radiology  Procedures:  August 19 endotracheal tube August 19 right IJ central venous line August 19 arterial line   Significant Diagnostic Tests:  August 18 CT abdomen > high grade SBO with transition at small umbilical hernia containing a portion on the mid jejunum. Cirrhosis with portal venous hypertension and marked abdominal varices.   Micro Data:  August 19 SARS-CoV-2> negative Hepatitis A IgM ab neg Hepatitis Bs antigen negative Hepatitis B core IGM negative Hepatitis C antibody 0.1  Antimicrobials:  August 19 Zosyn>  Interim history/subjective:  Remains stable off pressors  Weaning sedation   Objective   Blood pressure (!) 110/56, pulse 76, temperature 99.2 F (37.3 C), temperature source Oral, resp. rate 16, height 6' (1.829 m), weight 112.2 kg, SpO2 98 %.    Vent Mode: PRVC FiO2 (%):  [40 %] 40 % Set Rate:  [15 bmp] 15 bmp Vt Set:  [320 mL-620 mL] 320 mL PEEP:  [5 cmH20] 5 cmH20 Pressure Support:  [5 cmH20] 5 cmH20 Plateau Pressure:  [15 cmH20-19 cmH20] 15 cmH20   Intake/Output Summary (Last 24 hours) at 05/19/2020 0736 Last data filed at 05/19/2020 0600 Gross per 24 hour  Intake 2504.05 ml  Output 8115 ml  Net -5610.95 ml   Filed Weights   05/16/20 0100 05/18/20 0500  Weight: 102.1 kg 112.2 kg    Examination: General: Critically and chronically ill appearing middle aged M, sedated intubated NAD  HENT: NCAT icteric sclera  Lungs: Mechanically ventilated. Symmetrical chest expansion diminished bibasialr sounds  Cardiovascular: rrr s1s2 no rgm cap refill < 3 seconds  Abdomen: round mildly distended. + bowel sounds  Extremities: BUE  BLE edema no cyanosis or clubbing  Neuro: lightly sedated awakens follows commands Skin: [facility] Problem list   Lactic Acidosis Hemorrhagic shock Hypomagnesemia Hypocalcemia Hypokalemia Hyperphosphatemia  Assessment & Plan:   Acute hypoxic  respiratory failure requiring intubation -inability to protect airway in setting of variceal bleeding, hemorrhagic shock    P: - Cont full M support - WUA/SBT when medically appropriate -VAP -PAD (fent gtt prop gtt precedex gtt -- weaning doses but multimodal does work best for this pt)  Acute metabolic encephalopathy, multifactorial etiology  EtOH abuse disorder, high risk DTs  -no hx of seizure with prior withdrawals however use was lighter  Acute hepatic encephalopathy P -Cont lactulose  -gradually minimizing sedation    ABLA in setting of variceal bleeding and improved hemorrhagic shock - s/p emergent TIPS 8/19, blood product resuscitation  P -transfuse PRN for hgb > 7 -trend CBC, coags  -BID protonix  Decompensated alcoholic cirrhosis with portal hypertension: S/p TIPS 8/29 Coagulopathy, hyperbilirubinemia  S/p FFB, Vit K 8/20 overnight Acute alcoholic hepatitis MELD 35 P -IR following, will need repeat US in approx 1 month -IV steroids -rifaximin   Leukocytosis P -cont zosyn    Acute kidney injury, non-oliguric, improving  -Likely prerenal injury in setting of hemorrhagic shock with possible ATN, at risk HRS however  P -trending renal indices, UOP -nephro signing off given improvements  Incarcerated bowel, hernia reduced at OSH P - d/w CCS, no role for surgical intervention - trickle EN   Best practice:  Diet: trickle EN  Pain/Anxiety/Delirium protocol (if indicated): low dose fent prop precedex gtts  VAP protocol (if indicated): Yes DVT prophylaxis: SCD GI prophylaxis: Protonix BID  Glucose control: Monitor Mobility: Bedrest Code Status: Full Family Communication: wife at bedside 8/23  Disposition: ICU  Labs   CBC: Recent Labs  Lab 05/17/20 0453 05/17/20 1131 05/17/20 1819 05/18/20 0045 05/18/20 0546  WBC 15.5* 14.5* 13.2* 11.6* 12.2*  HGB 8.5* 8.4* 8.6* 8.9* 8.8*  HCT 25.6* 25.2* 26.1* 27.0* 27.1*  MCV 90.1 90.6 90.6 92.2 92.2  PLT  130* 122* 130* 122* 123*    Basic Metabolic Panel: Recent Labs  Lab 05/16/20 0019 05/16/20 0019 05/16/20 0026 05/16/20 0610 05/16/20 1824 05/17/20 0453 05/17/20 0453 05/17/20 1819 05/18/20 0500 05/18/20 1400 05/18/20 1820 05/19/20 0551  NA 134*   < > 139 134*  --  140  --   --  143  --   --  148*  K 3.1*   < > 2.8* 3.2*  --  3.1*  --   --  3.6  --   --  3.6  CL 99  --   --  99  --  105  --   --  106  --   --  107  CO2 19*  --   --  21*  --  24  --   --  25  --   --  28  GLUCOSE 125*  --   --  163*  --  132*  --   --  147*  --   --  156*  BUN 40*  --   --  45*  --  37*  --   --  24*  --   --  24*  CREATININE 3.81*  --   --  3.50*  --  1.71*  --   --  0.98  --   --  0.91  CALCIUM 6.4*  --   --  6.5*  --  7.3*  --   --  7.8*  --   --  8.2*  MG 1.1*   < >  --  2.1   < > 2.3   < > 2.5* 2.4 2.5* 2.3 2.2  PHOS 7.7*  --   --  4.9*  --   --   --   --   --  2.3* 2.4* 3.4   < > = values in this interval not displayed.   GFR: Estimated Creatinine Clearance: 129.7 mL/min (by C-G formula based on SCr of 0.91 mg/dL). Recent Labs  Lab 05/16/20 0018 05/16/20 0019 05/16/20 0610 05/16/20 1430 05/17/20 1131 05/17/20 1819 05/18/20 0045 05/18/20 0546  WBC  --    < > 23.8*   < > 14.5* 13.2* 11.6* 12.2*  LATICACIDVEN 5.6*  --  3.0*  --   --   --   --   --    < > = values in this interval not displayed.    Liver Function Tests: Recent Labs  Lab 05/16/20 0019 05/17/20 0453 05/18/20 0500 05/19/20 0551  AST 109* 354* 332* 220*  ALT 32 82* 95* 85*  ALKPHOS 74 75 109 144*  BILITOT 4.7* 6.4* 6.7* 5.3*  PROT 5.2* 5.8* 6.3* 6.6  ALBUMIN 1.9* 2.7* 2.9* 2.9*   No results for input(s): LIPASE, AMYLASE in the last 168 hours. Recent Labs  Lab 05/16/20 0208 05/17/20 0453  AMMONIA 154* 132*    ABG    Component Value Date/Time   PHART 7.326 (L) 05/16/2020 0026   PCO2ART 38.8 05/16/2020 0026   PO2ART 112 (H) 05/16/2020 0026   HCO3 20.2 05/16/2020 0026   TCO2 21 (L) 05/16/2020 0026     ACIDBASEDEF 5.0 (H) 05/16/2020 0026   O2SAT 98.0 05/16/2020 0026     Coagulation Profile: Recent Labs  Lab 05/16/20 1824 05/17/20 0453 05/17/20 1819 05/18/20 0500 05/18/20 1820  INR 1.9* 1.8* 1.7* 1.6* 1.5*    Cardiac Enzymes: No results for input(s): CKTOTAL, CKMB, CKMBINDEX, TROPONINI in the last 168 hours.  HbA1C: No results found for: HGBA1C  CBG: Recent Labs  Lab 05/18/20 1555 05/18/20 1959  GLUCAP 140* 150*     CRITICAL CARE Performed by: Cristal Generous   Total critical care time: 38 minutes  Critical care time was exclusive of separately billable procedures and treating other patients.  Critical care was necessary to treat or prevent imminent or life-threatening deterioration.  Critical care was time spent personally by me on the following activities: development of treatment plan with patient and/or surrogate as well as nursing, discussions with consultants, evaluation of patient's response to treatment, examination of patient, obtaining history from patient or surrogate, ordering and performing treatments and interventions, ordering and review of laboratory studies, ordering and review of radiographic studies, pulse oximetry and re-evaluation of patient's condition.  Eliseo Gum MSN, AGACNP-BC [place] [phone] If no answer, [phone] 05/19/2020, 7:37 AM

## 2020-05-19 NOTE — Procedures (Signed)
Extubation Procedure Note  Patient Details:   Name: Jimmy Gonzales DOB: May 13, 1971 MRN: [id number]   Airway Documentation:    Vent end date: 05/19/20 Vent end time: 1720   Evaluation  O2 sats: 25% Complications: No apparent complications Patient did tolerate procedure well. Bilateral Breath Sounds: Diminished, Rhonchi   Pt able to speak lightly, Pt on 6L [place]  97%  [name] 05/19/2020, 5:28 PM

## 2020-05-20 LAB — GLUCOSE, CAPILLARY
Glucose-Capillary: 103 mg/dL — ABNORMAL HIGH (ref 70–99)
Glucose-Capillary: 122 mg/dL — ABNORMAL HIGH (ref 70–99)
Glucose-Capillary: 124 mg/dL — ABNORMAL HIGH (ref 70–99)
Glucose-Capillary: 91 mg/dL (ref 70–99)
Glucose-Capillary: 93 mg/dL (ref 70–99)

## 2020-05-20 LAB — CBC
HCT: 28.3 % — ABNORMAL LOW (ref 39.0–52.0)
Hemoglobin: 8.9 g/dL — ABNORMAL LOW (ref 13.0–17.0)
MCH: 29.3 pg (ref 26.0–34.0)
MCHC: 31.4 g/dL (ref 30.0–36.0)
MCV: 93.1 fL (ref 80.0–100.0)
Platelets: 140 10*3/uL — ABNORMAL LOW (ref 150–400)
RBC: 3.04 MIL/uL — ABNORMAL LOW (ref 4.22–5.81)
RDW: 16.9 % — ABNORMAL HIGH (ref 11.5–15.5)
WBC: 12.7 10*3/uL — ABNORMAL HIGH (ref 4.0–10.5)
nRBC: 0.2 % (ref 0.0–0.2)

## 2020-05-20 LAB — BASIC METABOLIC PANEL
Anion gap: 10 (ref 5–15)
BUN: 21 mg/dL — ABNORMAL HIGH (ref 6–20)
CO2: 28 mmol/L (ref 22–32)
Calcium: 8.4 mg/dL — ABNORMAL LOW (ref 8.9–10.3)
Chloride: 102 mmol/L (ref 98–111)
Creatinine, Ser: 0.74 mg/dL (ref 0.61–1.24)
GFR calc Af Amer: >60 mL/min (ref >60)
GFR calc non Af Amer: >60 mL/min (ref >60)
Glucose, Bld: 119 mg/dL — ABNORMAL HIGH (ref 70–99)
Potassium: 3.3 mmol/L — ABNORMAL LOW (ref 3.5–5.1)
Sodium: 140 mmol/L (ref 135–145)

## 2020-05-20 MED ORDER — PANTOPRAZOLE SODIUM 40 MG PO TBEC: 40.0000 mg | DELAYED_RELEASE_TABLET | Freq: Every day | ORAL | Status: DC

## 2020-05-20 MED ORDER — OXYCODONE HCL 5 MG PO TABS
5.0000 mg | ORAL_TABLET | Freq: Four times a day (QID) | ORAL | Status: DC | PRN
  Filled 2020-05-20: qty 1

## 2020-05-20 MED ORDER — PANTOPRAZOLE SODIUM 40 MG PO TBEC
40.0000 mg | DELAYED_RELEASE_TABLET | Freq: Two times a day (BID) | ORAL | Status: DC
  Administered 2020-05-20 – 2020-05-26 (×13): 40 mg via ORAL
  Filled 2020-05-20 (×13): qty 1

## 2020-05-20 MED ORDER — LEVOTHYROXINE SODIUM 25 MCG PO TABS
25.0000 ug | ORAL_TABLET | Freq: Every day | ORAL | Status: DC
  Administered 2020-05-20 – 2020-05-26 (×6): 25 ug via ORAL
  Filled 2020-05-20 (×8): qty 1

## 2020-05-20 MED ORDER — HALOPERIDOL LACTATE 5 MG/ML IJ SOLN: 5.0000 mg | Freq: Four times a day (QID) | INTRAMUSCULAR | Status: DC | PRN

## 2020-05-20 MED ORDER — PREDNISONE 10 MG PO TABS: 10.0000 mg | ORAL_TABLET | Freq: Every day | ORAL | Status: DC

## 2020-05-20 MED ORDER — PREDNISONE 20 MG PO TABS: 20.0000 mg | ORAL_TABLET | Freq: Every day | ORAL | Status: DC

## 2020-05-20 MED ORDER — PREDNISONE 5 MG PO TABS: 5.0000 mg | ORAL_TABLET | Freq: Every day | ORAL | Status: DC

## 2020-05-20 MED ORDER — ENSURE ENLIVE PO LIQD
237.0000 mL | Freq: Two times a day (BID) | ORAL | Status: DC
  Administered 2020-05-23 – 2020-05-26 (×8): 237 mL via ORAL

## 2020-05-20 MED ORDER — LACTULOSE 10 GM/15ML PO SOLN
20.0000 g | Freq: Two times a day (BID) | ORAL | Status: DC
  Administered 2020-05-20 – 2020-05-26 (×13): 20 g via ORAL
  Filled 2020-05-20 (×13): qty 30

## 2020-05-20 MED ORDER — SODIUM CHLORIDE 0.9 % IV SOLN
2.0000 g | INTRAVENOUS | Status: DC
  Administered 2020-05-20 – 2020-05-25 (×6): 2 g via INTRAVENOUS
  Filled 2020-05-20 (×5): qty 2
  Filled 2020-05-20: qty 20
  Filled 2020-05-20: qty 2

## 2020-05-20 MED ORDER — LORAZEPAM 2 MG/ML IJ SOLN
2.0000 mg | INTRAMUSCULAR | Status: DC | PRN
  Administered 2020-05-23: 2 mg via INTRAVENOUS
  Filled 2020-05-20: qty 1

## 2020-05-20 MED ORDER — PREDNISONE 20 MG PO TABS
40.0000 mg | ORAL_TABLET | Freq: Every day | ORAL | Status: DC
  Administered 2020-05-21 – 2020-05-25 (×5): 40 mg via ORAL
  Filled 2020-05-20 (×5): qty 2

## 2020-05-20 MED ORDER — PREDNISONE 20 MG PO TABS: 30.0000 mg | ORAL_TABLET | Freq: Every day | ORAL | Status: DC

## 2020-05-20 MED ORDER — POTASSIUM CHLORIDE 20 MEQ PO PACK
40.0000 meq | PACK | Freq: Once | ORAL | Status: AC
  Administered 2020-05-20: 40 meq via ORAL
  Filled 2020-05-20: qty 2

## 2020-05-20 NOTE — Evaluation (Signed)
Occupational Therapy Evaluation Patient Details Name: Jimmy Gonzales MRN: [id number] DOB: 19-Mar-1971 Today's Date: 05/20/2020    History of Present Illness 49 year old male with a past medical history significant for heavy alcohol abuse presented to [facility] with hematemesis, developed hemorrhagic shock, GI unable to control bleeding with endoscopy.  Transferred to [facility] for emergent TIPS placement.   Clinical Impression   PTA patient independent and working. Admitted for above and limited by problem list below, including generalized weakness and decreased BUE coordination, impaired balance, and impaired cognition.  Patient re-oriented to time and situation, follows simple commands with increased time but difficulty following multiple step commands, requires mod cueing to problem solve to navigate back to his room as well as cueing to recall room number.  Patient requires mod assist +2 for transfers, min assist to max assist +2 for ADLs at this time. Believe he will benefit from further OT services while admitted and after dc at CIR level to optimize independence with ADLs, mobility prior to return home.  Will follow acutely.     Follow Up Recommendations  CIR;Supervision/Assistance - 24 hour    Equipment Recommendations  Other (comment) (TBD at next venue of care )    Recommendations for Other Services Rehab consult;Speech consult     Precautions / Restrictions Precautions Precautions: Fall Restrictions Weight Bearing Restrictions: No      Mobility Bed Mobility Overal bed mobility: Needs Assistance Bed Mobility: Supine to Sit     Supine to sit: Mod assist     General bed mobility comments: max directional verbal cues to sequence task, min/modA for trunk elevation  Transfers Overall transfer level: Needs assistance Equipment used:  (2 person assist with gait belt) Transfers: Sit to/from Stand Sit to Stand: Mod assist;+2 physical assistance          General transfer comment: mod assist +2 to power up and steady, inital posterior bias and reaching for UE support     Balance Overall balance assessment: Needs assistance Sitting-balance support: Feet supported;No upper extremity supported Sitting balance-Leahy Scale: Fair Sitting balance - Comments: requires min guard to min assist dynamically with donning socks    Standing balance support: Bilateral upper extremity supported Standing balance-Leahy Scale: Poor Standing balance comment: dependent on UE assist                           ADL either performed or assessed with clinical judgement   ADL Overall ADL's : Needs assistance/impaired     Grooming: Minimal assistance;Sitting   Upper Body Bathing: Minimal assistance;Sitting   Lower Body Bathing: Moderate assistance;+2 for physical assistance;+2 for safety/equipment;Sit to/from stand   Upper Body Dressing : Minimal assistance;Sitting   Lower Body Dressing: Maximal assistance;+2 for physical assistance;+2 for safety/equipment;Sit to/from stand   Toilet Transfer: Moderate assistance;+2 for physical assistance;+2 for safety/equipment;Ambulation Toilet Transfer Details (indicate cue type and reason): simulated to recliner          Functional mobility during ADLs: Moderate assistance;+2 for physical assistance;+2 for safety/equipment;Cueing for sequencing;Cueing for safety General ADL Comments: pt limited by impaired cognition, decreased coordination, impaired balance and generalized weakness      Vision   Vision Assessment?: No apparent visual deficits     Perception     Praxis      Pertinent Vitals/Pain Pain Assessment: Faces Faces Pain Scale: No hurt     Hand Dominance Right   Extremity/Trunk Assessment Upper Extremity Assessment Upper Extremity Assessment: RUE  deficits/detail;LUE deficits/detail RUE Deficits / Details: grossly 3+/5, decreaesd coordination  RUE Coordination: decreased fine  motor;decreased gross motor LUE Deficits / Details: grossly 3+/5, decreaesd coordination  LUE Coordination: decreased fine motor;decreased gross motor   Lower Extremity Assessment Lower Extremity Assessment: Defer to PT evaluation   Cervical / Trunk Assessment Cervical / Trunk Assessment: Normal   Communication Communication Communication: No difficulties (delayed response)   Cognition Arousal/Alertness: Awake/alert Behavior During Therapy: Flat affect Overall Cognitive Status: Impaired/Different from baseline Area of Impairment: Orientation;Attention;Memory;Following commands;Safety/judgement;Awareness;Problem solving                 Orientation Level: Disoriented to;Situation;Time (initially reports June) Current Attention Level: Focused Memory: Decreased short-term memory (poor recall once oriented to situation) Following Commands: Follows one step commands with increased time;Follows multi-step commands inconsistently;Follows multi-step commands with increased time Safety/Judgement: Decreased awareness of safety;Decreased awareness of deficits Awareness: Intellectual Problem Solving: Slow processing;Requires verbal cues;Requires tactile cues;Difficulty sequencing;Decreased initiation General Comments: patient requires re-orientation to time and situation, follows simple commands with increased time for processing and initation, patient requires mod cueing for problem solving to locate room; poor recall of room number (perseverating on "east"), increased time to sequence months backwards with 2 errors; requires redirection and cueing to avoid pulling at lines    General Comments  VSS, yellow tint in eyes    Exercises     Shoulder Instructions      Home Living Family/patient expects to be discharged to:: Private residence Living Arrangements: Spouse/significant other Available Help at Discharge: Family;Available 24 hours/day Type of Home: House Home Access: Stairs to  enter [employer] of Steps: 1   Home Layout: Two level;Able to live on main level with bedroom/bathroom     Bathroom Shower/Tub: Occupational psychologist: Handicapped height     Home Equipment: None          Prior Functioning/Environment Level of Independence: Independent        Comments: pt runs a [employer], 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, works in the office        OT Problem List: Decreased strength;Decreased activity tolerance;Impaired balance (sitting and/or standing);Decreased coordination;Decreased cognition;Decreased safety awareness;Decreased knowledge of use of DME or AE;Decreased knowledge of precautions      OT Treatment/Interventions: Self-care/ADL training;DME and/or AE instruction;Therapeutic exercise;Energy conservation;Therapeutic activities;Cognitive remediation/compensation;Patient/family education;Balance training    OT Goals(Current goals can be found in the care plan section) Acute Rehab OT Goals Patient Stated Goal: didn't state Time For Goal Achievement: 06/03/20 Potential to Achieve Goals: Good  OT Frequency: Min 2X/week   Barriers to D/C:            Co-evaluation PT/OT/SLP Co-Evaluation/Treatment: Yes Reason for Co-Treatment: For patient/therapist safety;To address functional/ADL transfers PT goals addressed during session: Mobility/safety with mobility OT goals addressed during session: ADL's and self-care      AM-PAC OT "6 Clicks" Daily Activity     Outcome Measure Help from another person eating meals?: A Little Help from another person taking care of personal grooming?: A Little Help from another person toileting, which includes using toliet, bedpan, or urinal?: A Lot Help from another person bathing (including washing, rinsing, drying)?: A Lot Help from another person to put on and taking off regular upper body clothing?: A Little Help from another person to put on and taking off regular lower body clothing?: A  Lot 6 Click Score: 15   End of Session Equipment Utilized During Treatment: Gait belt Nurse Communication: Mobility status  Activity Tolerance: Patient tolerated  treatment well Patient left: in chair;with call bell/phone within reach;with chair alarm set;with family/visitor present  OT Visit Diagnosis: Other abnormalities of gait and mobility (R26.89);Muscle weakness (generalized) (M62.81);Other symptoms and signs involving cognitive function                Time: 1005-1034 OT Time Calculation (min): 29 min Charges:  OT General Charges $OT Visit: 1 Visit OT Evaluation $OT Eval Moderate Complexity: 1 Mod  Jolaine Artist, OT Acute Rehabilitation Services Pager [phone] Office [phone]    Jimmy Gonzales 05/20/2020, 2:27 PM

## 2020-05-20 NOTE — Progress Notes (Signed)
Subjective: Extubated yesterday.  Objective: Vital signs in last 24 hours: Temp:  [97.6 F (36.4 C)-100 F (37.8 C)] 99.3 F (37.4 C) (08/24 0800) Pulse Rate:  [38-91] 72 (08/24 0600) Resp:  [13-21] 13 (08/24 0800) BP: (104-159)/(62-99) 134/74 (08/24 0800) SpO2:  [89 %-100 %] 95 % (08/24 0600) Arterial Line BP: (138-187)/(60-93) 184/93 (08/23 2300) FiO2 (%):  [40 %] 40 % (08/23 1511) Weight change:  Last BM Date: 05/19/20  PE: GEN:  NAD, confused ABD:  Protuberant, soft, non-tender  Lab Results: CBC    Component Value Date/Time   WBC 12.7 (H) 05/20/2020 0546   RBC 3.04 (L) 05/20/2020 0546   HGB 8.9 (L) 05/20/2020 0546   HCT 28.3 (L) 05/20/2020 0546   PLT 140 (L) 05/20/2020 0546   MCV 93.1 05/20/2020 0546   MCH 29.3 05/20/2020 0546   MCHC 31.4 05/20/2020 0546   RDW 16.9 (H) 05/20/2020 0546   CMP     Component Value Date/Time   NA 140 05/20/2020 0546   K 3.3 (L) 05/20/2020 0546   CL 102 05/20/2020 0546   CO2 28 05/20/2020 0546   GLUCOSE 119 (H) 05/20/2020 0546   BUN 21 (H) 05/20/2020 0546   CREATININE 0.74 05/20/2020 0546   CALCIUM 8.4 (L) 05/20/2020 0546   PROT 6.6 05/19/2020 0551   ALBUMIN 2.9 (L) 05/19/2020 0551   AST 220 (H) 05/19/2020 0551   ALT 85 (H) 05/19/2020 0551   ALKPHOS 144 (H) 05/19/2020 0551   BILITOT 5.3 (H) 05/19/2020 0551   GFRNONAA >60 05/20/2020 0546   GFRAA >60 05/20/2020 0546   Assessment:  1.  Variceal bleed s/p TIPS. 2.  Confusion post-extubation, ?hepatic encephalopathy. 3.  Alcoholic hepatitis.  Plan:  1.  Steroids for alcohol mediated hepatitis. 2.  Xifaxan and lactulose (tapering down due to excessive diarrhea) for potential hepatic encephalopathy. 3.  Advance diet as tolerated. 4.  [facility] will follow.   Landry Dyke 05/20/2020, 10:02 AM   Cell [phone] If no answer or after 5 PM call [phone]

## 2020-05-20 NOTE — Progress Notes (Signed)
Rehab Admissions Coordinator Note:  Patient was screened by Cleatrice Burke for appropriateness for an Inpatient Acute Rehab Consult per PT recs. .  At this time, we are recommending Inpatient Rehab consult. Please place order for consult if you would like patient considered for admit. Please advise.  Cleatrice Burke RN MSN 05/20/2020, 1:59 PM  I can be reached at [phone].

## 2020-05-20 NOTE — Evaluation (Signed)
Physical Therapy Evaluation Patient Details Name: Jimmy Gonzales MRN: [id number] DOB: [birth year] Today's Date: 05/20/2020   History of Present Illness  49 year old male with a past medical history significant for heavy alcohol abuse presented to [facility] with hematemesis, developed hemorrhagic shock, GI unable to control bleeding with endoscopy.  Transferred to [facility] for emergent TIPS placement.    Clinical Impression  Pt admitted with above. Pt was indep and working PTA. Pt now with noted impaired cognition and functioning requiring modA for transfers and safe ambulation. Wife present during treatment and is aware of cognitive and functional deficits and is agreement that patient would benefit from CIR upon d/c to address deficits mentioned below. Acute PT to cont to follow.    Follow Up Recommendations CIR    Equipment Recommendations   (TBD at next venue)    Recommendations for Other Services Rehab consult     Precautions / Restrictions Precautions Precautions: Fall Restrictions Weight Bearing Restrictions: No      Mobility  Bed Mobility Overal bed mobility: Needs Assistance Bed Mobility: Supine to Sit     Supine to sit: Mod assist     General bed mobility comments: max directional verbal cues to sequence task, min/modA for trunk elevation  Transfers Overall transfer level: Needs assistance Equipment used:  (2 person assist with gait belt) Transfers: Sit to/from Stand Sit to Stand: Mod assist;+2 physical assistance         General transfer comment: modA x2 to power up and steady, once upright pt with posterior bias reaching for object to hold onto, wide base of support  Ambulation/Gait Ambulation/Gait assistance: Mod assist;+2 physical assistance;+2 safety/equipment Gait Distance (Feet): 150 Feet Assistive device: 2 person hand held assist Gait Pattern/deviations: Decreased stride length;Step-through pattern;Wide base of support Gait  velocity: decreased compared to baseline Gait velocity interpretation: <1.31 ft/sec, indicative of household ambulator General Gait Details: pt unsteady initially requiring bilat HHA then transitioned to R HHA only with improved step height and length  Stairs            Wheelchair Mobility    Modified Rankin (Stroke Patients Only)       Balance Overall balance assessment: Needs assistance Sitting-balance support: Feet supported;No upper extremity supported Sitting balance-Leahy Scale: Fair     Standing balance support: Bilateral upper extremity supported Standing balance-Leahy Scale: Poor Standing balance comment: dependent on UE assist                             Pertinent Vitals/Pain Pain Assessment: Faces Faces Pain Scale: No hurt    Home Living Family/patient expects to be discharged to:: Private residence Living Arrangements: Spouse/significant other Available Help at Discharge: Family;Available 24 hours/day Type of Home: House Home Access: Stairs to enter   [employer] of Steps: 1 Home Layout: Two level;Able to live on main level with bedroom/bathroom Home Equipment: None      Prior Function Level of Independence: Independent         Comments: pt runs a [employer], 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, works in the office     Hand Dominance   Dominant Hand: Right    Extremity/Trunk Assessment   Upper Extremity Assessment Upper Extremity Assessment:  (noted bilat fine and gross motor impaired co-ordination)    Lower Extremity Assessment Lower Extremity Assessment: Generalized weakness    Cervical / Trunk Assessment Cervical / Trunk Assessment: Normal  Communication   Communication: No difficulties (delayed response)  Cognition Arousal/Alertness: Awake/alert Behavior During Therapy: Flat affect Overall Cognitive Status: Impaired/Different from baseline Area of Impairment: Orientation;Attention;Memory;Following  commands;Safety/judgement;Awareness;Problem solving                 Orientation Level: Disoriented to;Situation Current Attention Level: Focused Memory: Decreased short-term memory (poor recall once oriented to situation) Following Commands: Follows one step commands with increased time;Follows multi-step commands inconsistently;Follows multi-step commands with increased time Safety/Judgement: Decreased awareness of safety;Decreased awareness of deficits Awareness: Intellectual Problem Solving: Slow processing;Decreased initiation;Requires verbal cues;Requires tactile cues General Comments: pt very slow to respond, depressed mood but was able to find room with moderate verbal cues      General Comments General comments (skin integrity, edema, etc.): VSS, yellow tint in eyes    Exercises     Assessment/Plan    PT Assessment Patient needs continued PT services  PT Problem List Decreased strength;Decreased activity tolerance;Decreased balance;Decreased mobility;Decreased coordination;Decreased cognition;Decreased safety awareness       PT Treatment Interventions DME instruction;Gait training;Stair training;Functional mobility training;Therapeutic activities;Therapeutic exercise;Cognitive remediation;Neuromuscular re-education;Balance training    PT Goals (Current goals can be found in the Care Plan section)  Acute Rehab PT Goals Patient Stated Goal: didn't state PT Goal Formulation: With patient/family Time For Goal Achievement: 06/03/20 Potential to Achieve Goals: Good    Frequency Min 4X/week   Barriers to discharge        Co-evaluation PT/OT/SLP Co-Evaluation/Treatment: Yes Reason for Co-Treatment: To address functional/ADL transfers PT goals addressed during session: Mobility/safety with mobility         AM-PAC PT "6 Clicks" Mobility  Outcome Measure Help needed turning from your back to your side while in a flat bed without using bedrails?: A Lot Help  needed moving from lying on your back to sitting on the side of a flat bed without using bedrails?: A Lot Help needed moving to and from a bed to a chair (including a wheelchair)?: A Lot Help needed standing up from a chair using your arms (e.g., wheelchair or bedside chair)?: A Lot Help needed to walk in hospital room?: A Lot Help needed climbing 3-5 steps with a railing? : A Lot 6 Click Score: 12    End of Session Equipment Utilized During Treatment: Gait belt Activity Tolerance: Patient tolerated treatment well Patient left: in chair;with call bell/phone within reach;with chair alarm set;with family/visitor present Nurse Communication: Mobility status PT Visit Diagnosis: Unsteadiness on feet (R26.81);Difficulty in walking, not elsewhere classified (R26.2)    Time: 1001-1033 PT Time Calculation (min) (ACUTE ONLY): 32 min   Charges:   PT Evaluation $PT Eval Moderate Complexity: 1 Mod          Kittie Plater, PT, DPT Acute Rehabilitation Services Pager #: [phone] Office #: [phone]   WONG STEADHAM 05/20/2020, 1:55 PM

## 2020-05-20 NOTE — Plan of Care (Signed)
  Problem: Role Relationship: Goal: Method of communication will improve Outcome: Progressing   Problem: Bowel/Gastric: Goal: Will show no signs and symptoms of gastrointestinal bleeding Outcome: Progressing   Problem: Respiratory: Goal: Ability to maintain a clear airway and adequate ventilation will improve Outcome: Completed/Met

## 2020-05-20 NOTE — Evaluation (Signed)
Clinical/Bedside Swallow Evaluation Patient Details  Name: Jimmy Gonzales MRN: [id number] Date of Birth: 1971/05/08  Today's Date: 05/20/2020 Time: SLP Start Time (ACUTE ONLY): 5885 SLP Stop Time (ACUTE ONLY): 0848 SLP Time Calculation (min) (ACUTE ONLY): 10 min  Past Medical History: History reviewed. No pertinent past medical history. Past Surgical History:  Past Surgical History:  Procedure Laterality Date  . IR ANGIOGRAM SELECTIVE EACH ADDITIONAL VESSEL  05/16/2020  . IR ANGIOGRAM SELECTIVE EACH ADDITIONAL VESSEL  05/16/2020  . IR ANGIOGRAM SELECTIVE EACH ADDITIONAL VESSEL  05/16/2020  . IR EMBO ART  VEN HEMORR LYMPH EXTRAV  INC GUIDE ROADMAPPING  05/16/2020  . IR TIPS  05/16/2020  . RADIOLOGY WITH ANESTHESIA N/A 05/15/2020   Procedure: TIPS PROCEDURE;  Surgeon: Radiologist, Medication, MD;  Location: [place];  Service: Radiology;  Laterality: N/A;   HPI:  Pt is a 49 yo male admitted from [place] with uncontrolled bleeding and hemorrhagic shock s/p emergent endoscopy and banding of variceal lesions in esophagus. Pt was transferred to [facility] for emergent TIPS procedure 8/19. ETT 8/19-8/23. PMH includes alcoholic cirrhosis   Assessment / Plan / Recommendation Clinical Impression  Pt was intubated for several days, but his vocal quality is strong and clear, and his age also works in his favor. He has no overt coughing across all trials, although there was a small amount of delayed throat clearing at the end of the evaluation. This could also be suggestive of a more esophageal component. Would continue his regular diet and thin liquids with use of aspiration precautions, but in light of altered mentation, recent intubation, and GI issues, brief f/u for tolerance is warranted.   SLP Visit Diagnosis: Dysphagia, unspecified (R13.10)    Aspiration Risk  Mild aspiration risk    Diet Recommendation Regular;Thin liquid   Liquid Administration via: Cup;Straw Medication Administration: Whole meds  with liquid Supervision: Patient able to self feed;Intermittent supervision to cue for compensatory strategies Compensations: Slow rate;Small sips/bites Postural Changes: Seated upright at 90 degrees;Remain upright for at least 30 minutes after po intake    Other  Recommendations Oral Care Recommendations: Oral care BID   Follow up Recommendations None      Frequency and Duration min 1 x/week  1 week       Prognosis Prognosis for Safe Diet Advancement: Good      Swallow Study   General HPI: Pt is a 49 yo male admitted from [place] with uncontrolled bleeding and hemorrhagic shock s/p emergent endoscopy and banding of variceal lesions in esophagus. Pt was transferred to [facility] for emergent TIPS procedure 8/19. ETT 8/19-8/23. PMH includes alcoholic cirrhosis Type of Study: Bedside Swallow Evaluation Previous Swallow Assessment: none in chart Diet Prior to this Study: Regular;Thin liquids Temperature Spikes Noted: Yes (100) Respiratory Status: Room air History of Recent Intubation: Yes Length of Intubations (days): 4 days Date extubated: 05/19/20 Behavior/Cognition: Alert;Cooperative;Confused Oral Cavity Assessment: Within Functional Limits Oral Care Completed by SLP: No Oral Cavity - Dentition: Adequate natural dentition Vision: Functional for self-feeding Self-Feeding Abilities: Able to feed self Patient Positioning: Upright in bed Baseline Vocal Quality: Normal Volitional Swallow: Able to elicit    Oral/Motor/Sensory Function Overall Oral Motor/Sensory Function: Within functional limits   Ice Chips Ice chips: Within functional limits Presentation: Cup   Thin Liquid Thin Liquid: Impaired Presentation: Self Fed;Cup;Straw Pharyngeal  Phase Impairments: Throat Clearing - Delayed    Nectar Thick Nectar Thick Liquid: Not tested   Honey Thick Honey Thick Liquid: Not tested   Puree Puree:  Within functional limits Presentation: Self Fed;Spoon   Solid     Solid: Within  functional limits Presentation: Self Fed      Osie Bond., M.A. [place] Pager [phone] Office [phone]  05/20/2020,8:55 AM

## 2020-05-20 NOTE — Progress Notes (Signed)
NAME:  Jimmy Gonzales, MRN:  [id number], DOB:  25-Sep-1971, LOS: 5 ADMISSION DATE:  05/15/2020, CONSULTATION DATE: May 15, 2020 REFERRING MD: Dr. Kathlene Cote, CHIEF COMPLAINT: Vomiting blood  Brief History   49 year old male with a past medical history significant for heavy alcohol abuse presented to [facility] with hematemesis, developed hemorrhagic shock, GI unable to control bleeding with endoscopy.  Transferred to [facility] for emergent TIPS placement.  History of present illness   This is a 49 year old male who has a past medical history significant for drinking 1/2 gallon of hard liquor a day who presented to [facility] with a chief complaint of hematemesis on August 18.  Apparently he had developed abdominal pain and nausea and vomiting which was initially nonbilious nonbloody for 2 days prior to admission.  He was on the way to see his physician on August 18 when he suddenly developed hematemesis.  In the emergency department he was noted to have a large ventral hernia which was incarcerated based on CT scanning.  This was easily reduced and he was admitted to the intensive care unit on an octreotide infusion.  However, hematemesis and hematochezia persisted.  He required intubation, central line placement, vasopressor infusion, multiple blood transfusions, and emergent endoscopy.  Unfortunately endoscopy was incomplete as the patient had significant bleeding.  7 bands were placed but esophageal variceal bleeding was unable to be controlled.  Reportedly Covid negative. He was transferred to [facility] for emergent TIPS. 2 units PRBC given during the procedure. PCCM accepted the patient for ICU admission.   Past Medical History  Alcohol abuse  Significant Hospital Events   8/ 18 admitted [facility] 8/ 19 emergent endoscopy, banding of variceal lesions in esophagus, transferred to [facility] for emergent TIPS procedure 8/ 20 continued blood  product administration   Consults:  Interventional radiology  Procedures:  August 19 endotracheal tube August 19 right IJ central venous line August 19 arterial line   Significant Diagnostic Tests:  August 18 CT abdomen > high grade SBO with transition at small umbilical hernia containing a portion on the mid jejunum. Cirrhosis with portal venous hypertension and marked abdominal varices.   Micro Data:  August 19 SARS-CoV-2> negative Hepatitis A IgM ab neg Hepatitis Bs antigen negative Hepatitis B core IGM negative Hepatitis C antibody 0.1  Antimicrobials:  August 19 Zosyn>  Interim history/subjective:   Extubated yesterday. No respiratory distress. Passed swallowing evaluation. Remains confused and attempts to get out of bed. Needs frequent redirection.  Objective   Blood pressure 134/74, pulse 72, temperature 99.3 F (37.4 C), temperature source Oral, resp. rate 13, height 6' (1.829 m), weight 112.2 kg, SpO2 95 %.    Vent Mode: PSV;CPAP FiO2 (%):  [40 %] 40 % Set Rate:  [15 bmp] 15 bmp Vt Set:  [620 mL] 620 mL PEEP:  [5 cmH20] 5 cmH20 Pressure Support:  [5 cmH20] 5 cmH20 Plateau Pressure:  [14 cmH20] 14 cmH20   Intake/Output Summary (Last 24 hours) at 05/20/2020 0943 Last data filed at 05/20/2020 0800 Gross per 24 hour  Intake 1228.27 ml  Output 4430 ml  Net -3201.73 ml   Filed Weights   05/16/20 0100 05/18/20 0500  Weight: 102.1 kg 112.2 kg    Examination: General: Appears stated age. Average build. Lying calmly in bed.  HENT:icteric sclera  Lungs: chest clear on room air.  Cardiovascular: rrr s1s2 no rgm cap refill < 3 seconds  Abdomen: round mildly distended. + bowel sounds  Extremities: BUE BLE edema no cyanosis or clubbing  Neuro: calm, follows commands with no focal deficits. No tremors. Confused. Speech fluent but responses are nonsensical Skin: Jaundiced , no edema  Resolved Hospital Problem list   Lactic Acidosis Hemorrhagic  shock Hypomagnesemia Hypocalcemia Hypokalemia Hyperphosphatemia Acute respiratory failure Incarcerated bowel, hernia reduced at OSH AKI  Assessment & Plan:   Was critically ill due to Acute hypoxic respiratory failure requiring intubation Now extubated   - progressive ambulation.   Remains critically ill due to acute metabolic encephalopathy requiring titration of Precedex, multifactorial etiology including hepatic encephalopathy EtOH abuse disorder, high risk DTs  -Cont lactulose at lower dose given diarrhea.  - Titrate Precedex.  - PRN lorazepam/haloperidol.  - Continue thiamine.  - mental status precludes safe transfer, at risk for self harm and re-intubation from mental status decline.     ABLA in setting of variceal bleeding and improved hemorrhagic shock. s/p emergent TIPS 8/19, blood product resuscitation  HB now stable and no signs of bleeding.  - follow CBC - Continue Protonix.   Decompensated alcoholic cirrhosis with portal hypertension, MELD 35 S/p TIPS 8/29 Coagulopathy, hyperbilirubinemia  Acute alcoholic hepatitis - Continue steroids for alcoholic hepatitis.   Best practice:  Diet: trickle full diet Pain/Anxiety/Delirium protocol (if indicated): Precedex and prn haloperidol VAP protocol (if indicated): not intubated. DVT prophylaxis: SCD only GI prophylaxis: Protonix BID  Glucose control: Monitor Mobility: Bedrest Code Status: Full Family Communication: wife at bedside 8/24 Disposition: ICU  Labs   CBC: Recent Labs  Lab 05/17/20 1819 05/18/20 0045 05/18/20 0546 05/19/20 1646 05/20/20 0546  WBC 13.2* 11.6* 12.2* 14.6* 12.7*  HGB 8.6* 8.9* 8.8* 9.3* 8.9*  HCT 26.1* 27.0* 27.1* 29.4* 28.3*  MCV 90.6 92.2 92.2 92.5 93.1  PLT 130* 122* 123* 164 140*    Basic Metabolic Panel: Recent Labs  Lab 05/16/20 0610 05/16/20 1824 05/17/20 0453 05/17/20 1819 05/18/20 0500 05/18/20 1400 05/18/20 1820 05/19/20 0551 05/19/20 1646 05/20/20 0546  NA  134*  --  140  --  143  --   --  148*  --  140  K 3.2*  --  3.1*  --  3.6  --   --  3.6  --  3.3*  CL 99  --  105  --  106  --   --  107  --  102  CO2 21*  --  24  --  25  --   --  28  --  28  GLUCOSE 163*  --  132*  --  147*  --   --  156*  --  119*  BUN 45*  --  37*  --  24*  --   --  24*  --  21*  CREATININE 3.50*  --  1.71*  --  0.98  --   --  0.91  --  0.74  CALCIUM 6.5*  --  7.3*  --  7.8*  --   --  8.2*  --  8.4*  MG 2.1   < > 2.3   < > 2.4 2.5* 2.3 2.2 2.1  --   PHOS 4.9*  --   --   --   --  2.3* 2.4* 3.4 2.9  --    < > = values in this interval not displayed.   GFR: Estimated Creatinine Clearance: 147.6 mL/min (by C-G formula based on SCr of 0.74 mg/dL). Recent Labs  Lab 05/16/20 0018 05/16/20 0019 05/16/20 0610 05/16/20 1430 05/18/20 0045 05/18/20  7510 05/19/20 1646 05/20/20 0546  WBC  --    < > 23.8*   < > 11.6* 12.2* 14.6* 12.7*  LATICACIDVEN 5.6*  --  3.0*  --   --   --   --   --    < > = values in this interval not displayed.    Liver Function Tests: Recent Labs  Lab 05/16/20 0019 05/17/20 0453 05/18/20 0500 05/19/20 0551  AST 109* 354* 332* 220*  ALT 32 82* 95* 85*  ALKPHOS 74 75 109 144*  BILITOT 4.7* 6.4* 6.7* 5.3*  PROT 5.2* 5.8* 6.3* 6.6  ALBUMIN 1.9* 2.7* 2.9* 2.9*   No results for input(s): LIPASE, AMYLASE in the last 168 hours. Recent Labs  Lab 05/16/20 0208 05/17/20 0453  AMMONIA 154* 132*    ABG    Component Value Date/Time   PHART 7.326 (L) 05/16/2020 0026   PCO2ART 38.8 05/16/2020 0026   PO2ART 112 (H) 05/16/2020 0026   HCO3 20.2 05/16/2020 0026   TCO2 21 (L) 05/16/2020 0026   ACIDBASEDEF 5.0 (H) 05/16/2020 0026   O2SAT 98.0 05/16/2020 0026     Coagulation Profile: Recent Labs  Lab 05/16/20 1824 05/17/20 0453 05/17/20 1819 05/18/20 0500 05/18/20 1820  INR 1.9* 1.8* 1.7* 1.6* 1.5*    Cardiac Enzymes: No results for input(s): CKTOTAL, CKMB, CKMBINDEX, TROPONINI in the last 168 hours.  HbA1C: No results found for:  HGBA1C  CBG: Recent Labs  Lab 05/19/20 1553 05/19/20 1951 05/19/20 2338 05/20/20 0337 05/20/20 0825  GLUCAP 111* 120* 130* 124* 93     CRITICAL CARE Performed by: Kipp Brood   Total critical care time: 40 minutes  Critical care time was exclusive of separately billable procedures and treating other patients.  Critical care was necessary to treat or prevent imminent or life-threatening deterioration.  Critical care was time spent personally by me on the following activities: development of treatment plan with patient and/or surrogate as well as nursing, discussions with consultants, evaluation of patient's response to treatment, examination of patient, obtaining history from patient or surrogate, ordering and performing treatments and interventions, ordering and review of laboratory studies, ordering and review of radiographic studies, pulse oximetry and re-evaluation of patient's condition.  Kipp Brood, MD [facility] ICU Physician [place]  Pager: [phone] Mobile: [phone] After hours: [phone].  05/20/2020, 10:05 AM      If no answer, [phone] 05/20/2020, 9:43 AM

## 2020-05-20 NOTE — Progress Notes (Signed)
Nutrition Follow-up  DOCUMENTATION CODES:   Not applicable  INTERVENTION:   Ensure Enlive po BID, each supplement provides 350 kcal and 20 grams of protein  Encourage PO intake   NUTRITION DIAGNOSIS:   Increased nutrient needs related to catabolic illness (liver disease) as evidenced by estimated needs. Ongoing.   GOAL:   Patient will meet greater than or equal to 90% of their needs Progressing.   MONITOR:   I & O's  REASON FOR ASSESSMENT:   Consult Enteral/tube feeding initiation and management  ASSESSMENT:   Pt with PMH of heavy ETOH abuse (1/2 gallon whiskey per day) admitted from [place] with uncontrolled bleeding and hemorrhagic shock. Pt s/p emergent endoscopy, banding of variceal lesions in esophagus.   Pt discussed during ICU rounds and with RN.  Pt confused working with therapy, CIR consult pending.  Pt with alcoholic hepatitis possible hepatic encephalopathy post extubation  8/18 CT abdomen > high grade SBO with transition at small umbilical hernia containing a portion on the mid jejunum. Cirrhosis with portal venous hypertension and marked abdominal varices.   8/18 pt with variceal bleed s/p TIPS 8/23 self-extubated 8/24 advanced to Regular diet   Medications reviewed and include: lactulose, thiamine  Precedex Labs reviewed: K+ 3.3     Diet Order:   Diet Order            Diet regular Room service appropriate? Yes with Assist; Fluid consistency: Thin  Diet effective now                 EDUCATION NEEDS:   No education needs have been identified at this time  Skin:  Skin Assessment: Skin Integrity Issues: Skin Integrity Issues:: Incisions Incisions: R abdomen  Last BM:  2200 ml via rectal tube (on lactulose but decreasing)  Height:   Ht Readings from Last 1 Encounters:  05/15/20 6' (1.829 m)    Weight:   Wt Readings from Last 1 Encounters:  05/18/20 112.2 kg    Ideal Body Weight:  80.9 kg  BMI:  Body mass index is 33.55  kg/m.  Estimated Nutritional Needs:   Kcal:  2500-2700  Protein:  150-165 grams  Fluid:  >2 L/day  Lockie Pares., RD, LDN, CNSC See AMiON for contact information

## 2020-05-21 LAB — CBC
HCT: 34.3 % — ABNORMAL LOW (ref 39.0–52.0)
Hemoglobin: 11.1 g/dL — ABNORMAL LOW (ref 13.0–17.0)
MCH: 29.8 pg (ref 26.0–34.0)
MCHC: 32.4 g/dL (ref 30.0–36.0)
MCV: 92.2 fL (ref 80.0–100.0)
Platelets: ADEQUATE 10*3/uL (ref 150–400)
RBC: 3.72 MIL/uL — ABNORMAL LOW (ref 4.22–5.81)
RDW: 17.1 % — ABNORMAL HIGH (ref 11.5–15.5)
WBC: 26.5 10*3/uL — ABNORMAL HIGH (ref 4.0–10.5)
nRBC: 0.2 % (ref 0.0–0.2)

## 2020-05-21 LAB — BASIC METABOLIC PANEL
Anion gap: 14 (ref 5–15)
BUN: 11 mg/dL (ref 6–20)
CO2: 24 mmol/L (ref 22–32)
Calcium: 8.2 mg/dL — ABNORMAL LOW (ref 8.9–10.3)
Chloride: 98 mmol/L (ref 98–111)
Creatinine, Ser: 0.74 mg/dL (ref 0.61–1.24)
GFR calc Af Amer: >60 mL/min (ref >60)
GFR calc non Af Amer: >60 mL/min (ref >60)
Glucose, Bld: 109 mg/dL — ABNORMAL HIGH (ref 70–99)
Potassium: 2.8 mmol/L — ABNORMAL LOW (ref 3.5–5.1)
Sodium: 136 mmol/L (ref 135–145)

## 2020-05-21 LAB — GLUCOSE, CAPILLARY
Glucose-Capillary: 97 mg/dL (ref 70–99)
Glucose-Capillary: 107 mg/dL — ABNORMAL HIGH (ref 70–99)
Glucose-Capillary: 147 mg/dL — ABNORMAL HIGH (ref 70–99)

## 2020-05-21 MED ORDER — POTASSIUM CHLORIDE CRYS ER 20 MEQ PO TBCR
40.0000 meq | EXTENDED_RELEASE_TABLET | ORAL | Status: AC
  Administered 2020-05-21 (×2): 40 meq via ORAL
  Filled 2020-05-21 (×2): qty 2

## 2020-05-21 MED ORDER — THIAMINE HCL 100 MG PO TABS
100.0000 mg | ORAL_TABLET | Freq: Every day | ORAL | Status: DC
  Administered 2020-05-21 – 2020-05-26 (×6): 100 mg via ORAL
  Filled 2020-05-21 (×6): qty 1

## 2020-05-21 MED ORDER — NADOLOL 40 MG PO TABS
40.0000 mg | ORAL_TABLET | Freq: Every day | ORAL | Status: DC
  Filled 2020-05-21: qty 1

## 2020-05-21 NOTE — Progress Notes (Signed)
Physical Therapy Treatment Patient Details Name: Jimmy Gonzales MRN: [id number] DOB: 1970-10-08 Today's Date: 05/21/2020    History of Present Illness 49 year old male with a past medical history significant for heavy alcohol abuse presented to [facility] with hematemesis, developed hemorrhagic shock, GI unable to control bleeding with endoscopy.  Transferred to [facility] for emergent TIPS placement.    PT Comments    Pt able to tolerate increased ambulation tolerance however continues to demonstrate impaired balance, increased falls risk, in addition to impaired cognition. Pt perseverated and adamantly states todays date to be Friday, 8/26 despite max directional cues and presenting calendars and on a cell phone. 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 Acute PT to cont to follow.   Follow Up Recommendations  CIR     Equipment Recommendations   (TBD at next venue)    Recommendations for Other Services Rehab consult     Precautions / Restrictions Precautions Precautions: Fall Restrictions Weight Bearing Restrictions: No    Mobility  Bed Mobility Overal bed mobility: Needs Assistance Bed Mobility: Supine to Sit     Supine to sit: Mod assist     General bed mobility comments: max directional verbal cues to complete task, pt with frequent stops asking, what do you want me to do?  Transfers Overall transfer level: Needs assistance Equipment used: None Transfers: Sit to/from Stand Sit to Stand: Mod assist         General transfer comment: modA to power up and steady upon standing, pt with anterior/posterior sway and very wide base of support  Ambulation/Gait Ambulation/Gait assistance: Mod assist Gait Distance (Feet): 200 Feet Assistive device: 1 person hand held assist Gait Pattern/deviations: Step-through  pattern;Decreased stride length;Ataxic;Wide base of support Gait velocity: decreased compared to baseline Gait velocity interpretation: <1.31 ft/sec, indicative of household ambulator General Gait Details: pt unsteady with short, ataxic like stepping pattern gripping PT's hand hard and reaching for wife's hand, pt with increased WBIng on L onto PT, verbal cues to looks straight  and bring feet closer together, pt transitioned to minA but requiring modA for turning or with head turns   Marine scientist Rankin (Stroke Patients Only)       Balance Overall balance assessment: Needs assistance Sitting-balance support: Feet supported;No upper extremity supported Sitting balance-Leahy Scale: Fair Sitting balance - Comments: requires min guard to min assist dynamically with donning socks    Standing balance support: Bilateral upper extremity supported Standing balance-Leahy Scale: Poor Standing balance comment: dependent on UE assist                            Cognition Arousal/Alertness: Awake/alert Behavior During Therapy: Flat affect Overall Cognitive Status: Impaired/Different from baseline Area of Impairment: Orientation;Attention;Memory;Following commands;Safety/judgement;Awareness;Problem solving                 Orientation Level: Disoriented to;Time (pt adamant it's friday 8/26, saying phone & calendar is wron) Current Attention Level: Sustained Memory: Decreased short-term memory (doesn't remember walking with PT yesterday (same PT)) Following Commands: Follows one step commands with increased time;Follows multi-step commands inconsistently Safety/Judgement: Decreased awareness of safety;Decreased awareness of deficits Awareness: Emergent Problem Solving: Difficulty sequencing;Requires verbal cues;Requires tactile cues General Comments:  pt adamant about the date 8/26, Friday despite max directional verbal cues and  re-direction.       Exercises      General Comments General comments (skin integrity, edema, etc.): HR increased to 141bpm while ambulating from 109 bpm at rest. per RN pt not on precedex anymore      Pertinent Vitals/Pain Pain Assessment: No/denies pain    Home Living                      Prior Function            PT Goals (current goals can now be found in the care plan section) Progress towards PT goals: Progressing toward goals    Frequency    Min 4X/week      PT Plan Current plan remains appropriate    Co-evaluation              AM-PAC PT "6 Clicks" Mobility   Outcome Measure  Help needed turning from your back to your side while in a flat bed without using bedrails?: A Lot Help needed moving from lying on your back to sitting on the side of a flat bed without using bedrails?: A Lot Help needed moving to and from a bed to a chair (including a wheelchair)?: A Lot Help needed standing up from a chair using your arms (e.g., wheelchair or bedside chair)?: A Lot Help needed to walk in hospital room?: A Lot Help needed climbing 3-5 steps with a railing? : A Lot 6 Click Score: 12    End of Session Equipment Utilized During Treatment: Gait belt Activity Tolerance: Patient tolerated treatment well Patient left: in chair;with call bell/phone within reach;with chair alarm set;with family/visitor present Nurse Communication: Mobility status PT Visit Diagnosis: Unsteadiness on feet (R26.81);Difficulty in walking, not elsewhere classified (R26.2)     Time: 2536-6440 PT Time Calculation (min) (ACUTE ONLY): 25 min  Charges:  $Gait Training: 23-37 mins                     Kittie Plater, PT, DPT Acute Rehabilitation Services Pager #: [phone] Office #: [phone]    Jimmy Gonzales 05/21/2020, 1:01 PM

## 2020-05-21 NOTE — Progress Notes (Signed)
NAME:  Jimmy Gonzales, MRN:  [id number], DOB:  January 17, 1971, LOS: 6 ADMISSION DATE:  05/15/2020, CONSULTATION DATE: May 15, 2020 REFERRING MD: Dr. Kathlene Cote, CHIEF COMPLAINT: Vomiting blood  Brief History   49 year old male with a past medical history significant for heavy alcohol abuse presented to [facility] with hematemesis, developed hemorrhagic shock, GI unable to control bleeding with endoscopy.  Transferred to [facility] for emergent TIPS placement.  History of present illness   This is a 49 year old male who has a past medical history significant for drinking 1/2 gallon of hard liquor a day who presented to [facility] with a chief complaint of hematemesis on August 18.  Apparently he had developed abdominal pain and nausea and vomiting which was initially nonbilious nonbloody for 2 days prior to admission.  He was on the way to see his physician on August 18 when he suddenly developed hematemesis.  In the emergency department he was noted to have a large ventral hernia which was incarcerated based on CT scanning.  This was easily reduced and he was admitted to the intensive care unit on an octreotide infusion.  However, hematemesis and hematochezia persisted.  He required intubation, central line placement, vasopressor infusion, multiple blood transfusions, and emergent endoscopy.  Unfortunately endoscopy was incomplete as the patient had significant bleeding.  7 bands were placed but esophageal variceal bleeding was unable to be controlled.  Reportedly Covid negative. He was transferred to [facility] for emergent TIPS. 2 units PRBC given during the procedure. PCCM accepted the patient for ICU admission.   Past Medical History  Alcohol abuse  Significant Hospital Events   8/ 18 admitted [facility] 8/ 19 emergent endoscopy, banding of variceal lesions in esophagus, transferred to [facility] for emergent TIPS procedure 8/ 20 continued blood  product administration  8/23 extubated but remains confused 8/24 improving confusion.   Consults:  Interventional radiology  Procedures:  August 19 endotracheal tube August 19 right IJ central venous line August 19 arterial line   Significant Diagnostic Tests:  August 18 CT abdomen > high grade SBO with transition at small umbilical hernia containing a portion on the mid jejunum. Cirrhosis with portal venous hypertension and marked abdominal varices.   Micro Data:  August 19 SARS-CoV-2> negative Hepatitis A IgM ab neg Hepatitis Bs antigen negative Hepatitis B core IGM negative Hepatitis C antibody 0.1  Antimicrobials:  August 19 Zosyn>  Interim history/subjective:   No respiratory distress.  Denies pain.  Calm and comfortable now off Precedex.  Continues to have poor insight as to why he was hospitalized.  Objective   Blood pressure (!) 150/92, pulse (!) 119, temperature 98.9 F (37.2 C), temperature source Oral, resp. rate 15, height 6' (1.829 m), weight 112.2 kg, SpO2 96 %.        Intake/Output Summary (Last 24 hours) at 05/21/2020 1000 Last data filed at 05/21/2020 0930 Gross per 24 hour  Intake 300.08 ml  Output 2250 ml  Net -1949.92 ml   Filed Weights   05/16/20 0100 05/18/20 0500  Weight: 102.1 kg 112.2 kg    Examination: General: Appears stated age. Average build. Lying calmly in bed.  HENT:icteric sclera  Lungs: chest clear on room air.  Cardiovascular: rrr s1s2 no rgm cap refill < 3 seconds  Abdomen: round mildly distended. + bowel sounds  Extremities: BUE BLE edema no cyanosis or clubbing  Neuro: calm, follows commands with no focal deficits. No tremors. Confused. Speech fluent but responses are  nonsensical.  No tremor or asterixis. Skin: Jaundiced , no edema  Resolved Hospital Problem list   Lactic Acidosis Hemorrhagic shock Hypomagnesemia Hypocalcemia Hypokalemia Hyperphosphatemia Acute respiratory failure Incarcerated bowel, hernia reduced  at OSH AKI  Assessment & Plan:   Was critically ill due to Acute hypoxic respiratory failure requiring intubation Now extubated   - progressive ambulation.   Resolving acute metabolic encephalopathy , multifactorial etiology including hepatic encephalopathy EtOH abuse disorder, now out over withdrawal window. -Cont lactulose at lower dose given diarrhea.  - PRN lorazepam/haloperidol.  - Continue thiamine.  -Now ready for transfer.   ABLA in setting of variceal bleeding and improved hemorrhagic shock. s/p emergent TIPS 8/19, blood product resuscitation  HB now stable and no signs of bleeding.  - follow CBC - Continue Protonix.  -Start nadolol for variceal bleed prophylaxis. -Complete 7 days ceftriaxone for SBP prophylaxis.  Decompensated alcoholic cirrhosis with portal hypertension, MELD 35 S/p TIPS 8/29 Coagulopathy, hyperbilirubinemia  Acute alcoholic hepatitis - Continue steroids for alcoholic hepatitis with taper in place.  Best practice:  Diet: trickle full diet Pain/Anxiety/Delirium protocol (if indicated):  prn haloperidol VAP protocol (if indicated): not intubated. DVT prophylaxis: SCD only GI prophylaxis: Protonix BID  Glucose control: Monitor Mobility: Bedrest Code Status: Full Family Communication: wife at bedside 8/25 Disposition: Ready for transfer to floor.  Order reconciliation performed.  Hospitalist notified.  Labs   CBC: Recent Labs  Lab 05/17/20 1819 05/18/20 0045 05/18/20 0546 05/19/20 1646 05/20/20 0546  WBC 13.2* 11.6* 12.2* 14.6* 12.7*  HGB 8.6* 8.9* 8.8* 9.3* 8.9*  HCT 26.1* 27.0* 27.1* 29.4* 28.3*  MCV 90.6 92.2 92.2 92.5 93.1  PLT 130* 122* 123* 164 140*    Basic Metabolic Panel: Recent Labs  Lab 05/16/20 0610 05/16/20 1824 05/17/20 0453 05/17/20 1819 05/18/20 0500 05/18/20 1400 05/18/20 1820 05/19/20 0551 05/19/20 1646 05/20/20 0546 05/21/20 0756  NA 134*  --  140  --  143  --   --  148*  --  140 136  K 3.2*  --  3.1*   --  3.6  --   --  3.6  --  3.3* 2.8*  CL 99  --  105  --  106  --   --  107  --  102 98  CO2 21*  --  24  --  25  --   --  28  --  28 24  GLUCOSE 163*  --  132*  --  147*  --   --  156*  --  119* 109*  BUN 45*  --  37*  --  24*  --   --  24*  --  21* 11  CREATININE 3.50*  --  1.71*  --  0.98  --   --  0.91  --  0.74 0.74  CALCIUM 6.5*  --  7.3*  --  7.8*  --   --  8.2*  --  8.4* 8.2*  MG 2.1   < > 2.3   < > 2.4 2.5* 2.3 2.2 2.1  --   --   PHOS 4.9*  --   --   --   --  2.3* 2.4* 3.4 2.9  --   --    < > = values in this interval not displayed.   GFR: Estimated Creatinine Clearance: 147.6 mL/min (by C-G formula based on SCr of 0.74 mg/dL). Recent Labs  Lab 05/16/20 0018 05/16/20 0019 05/16/20 9628 05/16/20 1430 05/18/20 0045 05/18/20 0546 05/19/20 1646  05/20/20 0546  WBC  --    < > 23.8*   < > 11.6* 12.2* 14.6* 12.7*  LATICACIDVEN 5.6*  --  3.0*  --   --   --   --   --    < > = values in this interval not displayed.    Liver Function Tests: Recent Labs  Lab 05/16/20 0019 05/17/20 0453 05/18/20 0500 05/19/20 0551  AST 109* 354* 332* 220*  ALT 32 82* 95* 85*  ALKPHOS 74 75 109 144*  BILITOT 4.7* 6.4* 6.7* 5.3*  PROT 5.2* 5.8* 6.3* 6.6  ALBUMIN 1.9* 2.7* 2.9* 2.9*   No results for input(s): LIPASE, AMYLASE in the last 168 hours. Recent Labs  Lab 05/16/20 0208 05/17/20 0453  AMMONIA 154* 132*    ABG    Component Value Date/Time   PHART 7.326 (L) 05/16/2020 0026   PCO2ART 38.8 05/16/2020 0026   PO2ART 112 (H) 05/16/2020 0026   HCO3 20.2 05/16/2020 0026   TCO2 21 (L) 05/16/2020 0026   ACIDBASEDEF 5.0 (H) 05/16/2020 0026   O2SAT 98.0 05/16/2020 0026     Coagulation Profile: Recent Labs  Lab 05/16/20 1824 05/17/20 0453 05/17/20 1819 05/18/20 0500 05/18/20 1820  INR 1.9* 1.8* 1.7* 1.6* 1.5*    Cardiac Enzymes: No results for input(s): CKTOTAL, CKMB, CKMBINDEX, TROPONINI in the last 168 hours.  HbA1C: No results found for: HGBA1C  CBG: Recent Labs    Lab 05/20/20 1206 05/20/20 1957 05/20/20 2342 05/21/20 0333 05/21/20 0914  GLUCAP 103* 122* 91 97 107*   35 minutes spent with greater than 50% of time in counseling and coordination of care.  Kipp Brood, MD [facility] ICU Physician [place]  Pager: [phone] Mobile: [phone] After hours: [phone].  05/21/2020, 10:00 AM

## 2020-05-21 NOTE — Progress Notes (Signed)
[facility] Gastroenterology Progress Note  Jimmy Gonzales 49 y.o. 1971-06-02  CC: Cirrhosis, variceal bleeding s/p TIPS  Subjective: Patient awake but pleasantly confused.  Denies any complaints.  Able to state that we are at [facility] but unable to report situation, date, president.  ROS : Unable to obtain due to patient status (confused)  Objective: Vital signs in last 24 hours: Vitals:   05/21/20 0800 05/21/20 0900  BP: (!) 179/94 (!) 150/92  Pulse:    Resp: 19 15  Temp: 99.2 F (37.3 C)   SpO2: 96%     Physical Exam:  General:  Awake, confused, no acute distress; wife at bedside  Head:  Normocephalic, without obvious abnormality, atraumatic  Eyes:  Scleral icterus, EOM's intact,   Lungs:   Breathing comfortably on room air  Heart:  Tachycardic but with regular rate  Abdomen:   Distended but soft and non-tender, bowel sounds active all four quadrants    Lab Results: Recent Labs    05/19/20 0551 05/19/20 0551 05/19/20 1646 05/20/20 0546 05/21/20 0756  NA 148*   < >  --  140 136  K 3.6   < >  --  3.3* 2.8*  CL 107   < >  --  102 98  CO2 28   < >  --  28 24  GLUCOSE 156*   < >  --  119* 109*  BUN 24*   < >  --  21* 11  CREATININE 0.91   < >  --  0.74 0.74  CALCIUM 8.2*   < >  --  8.4* 8.2*  MG 2.2  --  2.1  --   --   PHOS 3.4  --  2.9  --   --    < > = values in this interval not displayed.   Recent Labs    05/19/20 0551  AST 220*  ALT 85*  ALKPHOS 144*  BILITOT 5.3*  PROT 6.6  ALBUMIN 2.9*   Recent Labs    05/19/20 1646 05/20/20 0546  WBC 14.6* 12.7*  HGB 9.3* 8.9*  HCT 29.4* 28.3*  MCV 92.5 93.1  PLT 164 140*   Recent Labs    05/18/20 1820  LABPROT 17.9*  INR 1.5*    Impression: Variceal bleeding s/p TIPS 05/16/20 -Hemoglobin 8.9 yesterday, has remained stable; today's Hgb pending -Pt hemodynamically stable  Decompensated cirrhosis, most likely from alcohol use.  MELD score of 35 as of 05/16/2020.  Hepatic discriminant function 68.6 as of  05/16/2020 -T bili 5.3/AST 220/ALT 85/ALP 144 as of 8/23   Hepatic encephalopathy: On lactulose and corticosteroids  Hypokalemia: Potassium 2.8 today  Plan: Continue lactulose 20g BID, titrate for 2-3 soft BMs/day.  Continue Rifaximin 550 mg BID.  Continue Protonix 40 mg twice daily.  Will hold off on beta blocker as patient is now s/p TIPS.  Recheck LFTs tomorrow.  Continue to monitor H&H with transfusion as needed to maintain hemoglobin greater than 7.  [facility] will follow.  Salley Slaughter PA-C 05/21/2020, 10:41 AM  Contact #  [phone]

## 2020-05-22 ENCOUNTER — Inpatient Hospital Stay (HOSPITAL_COMMUNITY): Payer: BC Managed Care – PPO

## 2020-05-22 LAB — GLUCOSE, CAPILLARY: Glucose-Capillary: 114 mg/dL — ABNORMAL HIGH (ref 70–99)

## 2020-05-22 LAB — BASIC METABOLIC PANEL
Anion gap: 10 (ref 5–15)
BUN: 8 mg/dL (ref 6–20)
CO2: 29 mmol/L (ref 22–32)
Calcium: 8.6 mg/dL — ABNORMAL LOW (ref 8.9–10.3)
Chloride: 101 mmol/L (ref 98–111)
Creatinine, Ser: 0.69 mg/dL (ref 0.61–1.24)
GFR calc Af Amer: >60 mL/min (ref >60)
GFR calc non Af Amer: >60 mL/min (ref >60)
Glucose, Bld: 120 mg/dL — ABNORMAL HIGH (ref 70–99)
Potassium: 3.9 mmol/L (ref 3.5–5.1)
Sodium: 140 mmol/L (ref 135–145)

## 2020-05-22 LAB — CBC
HCT: 30.5 % — ABNORMAL LOW (ref 39.0–52.0)
Hemoglobin: 9.6 g/dL — ABNORMAL LOW (ref 13.0–17.0)
MCH: 29.1 pg (ref 26.0–34.0)
MCHC: 31.5 g/dL (ref 30.0–36.0)
MCV: 92.4 fL (ref 80.0–100.0)
Platelets: 205 10*3/uL (ref 150–400)
RBC: 3.3 MIL/uL — ABNORMAL LOW (ref 4.22–5.81)
RDW: 17.1 % — ABNORMAL HIGH (ref 11.5–15.5)
WBC: 20.7 10*3/uL — ABNORMAL HIGH (ref 4.0–10.5)
nRBC: 0.1 % (ref 0.0–0.2)

## 2020-05-22 LAB — HEPATIC FUNCTION PANEL
ALT: 69 U/L — ABNORMAL HIGH (ref 0–44)
AST: 138 U/L — ABNORMAL HIGH (ref 15–41)
Albumin: 2.7 g/dL — ABNORMAL LOW (ref 3.5–5.0)
Alkaline Phosphatase: 173 U/L — ABNORMAL HIGH (ref 38–126)
Bilirubin, Direct: 2.6 mg/dL — ABNORMAL HIGH (ref 0.0–0.2)
Indirect Bilirubin: 3.7 mg/dL — ABNORMAL HIGH (ref 0.3–0.9)
Total Bilirubin: 6.3 mg/dL — ABNORMAL HIGH (ref 0.3–1.2)
Total Protein: 6.7 g/dL (ref 6.5–8.1)

## 2020-05-22 MED ORDER — AMLODIPINE BESYLATE 5 MG PO TABS
5.0000 mg | ORAL_TABLET | Freq: Every day | ORAL | Status: DC
  Administered 2020-05-22 – 2020-05-26 (×5): 5 mg via ORAL
  Filled 2020-05-22 (×5): qty 1

## 2020-05-22 MED ORDER — ALBUMIN HUMAN 25 % IV SOLN: 50.0000 g | Freq: Once | INTRAVENOUS | Status: DC

## 2020-05-22 NOTE — Progress Notes (Signed)
Physical Therapy Treatment Patient Details Name: Jimmy Gonzales MRN: [id number] DOB: April 13, 1971 Today's Date: 05/22/2020    History of Present Illness 49 year old male with a past medical history significant for heavy alcohol abuse presented to [facility] with hematemesis, developed hemorrhagic shock, GI unable to control bleeding with endoscopy.  Transferred to [facility] for emergent TIPS placement.    PT Comments    Pt tolerates treatment well but remains significantly cognitively altered. Pt with very slowed processing and requires verbal cues for all activities during session. Pt with poor memory, requires cues for room number 5 times during session. Pt remains unsteady  With ambulation and stair training, requiring physical assistance due to multiple losses of balance. Pt will benefit from continued acute PT POC to reduce falls risk and aide in a return to independence. PT continues to recommend CIR placement as the pt remains a high falls and safety risk and demonstrates the potential to return to a supervision level with high intensity inpatient PT services.  Follow Up Recommendations  CIR     Equipment Recommendations  Rolling walker with 5" wheels (if home today)    Recommendations for Other Services       Precautions / Restrictions Precautions Precautions: Fall Restrictions Weight Bearing Restrictions: No    Mobility  Bed Mobility Overal bed mobility: Needs Assistance Bed Mobility: Supine to Sit     Supine to sit: Supervision        Transfers Overall transfer level: Needs assistance Equipment used: None Transfers: Sit to/from Stand Sit to Stand: Min assist         General transfer comment: minA to power up and minA to slow descent  Ambulation/Gait Ambulation/Gait assistance: Min assist Gait Distance (Feet): 250 Feet Assistive device: 1 person hand held assist Gait Pattern/deviations: Step-to pattern;Drifts right/left;Wide base of  support Gait velocity: reduced Gait velocity interpretation: <1.8 ft/sec, indicate of risk for recurrent falls General Gait Details: pt with shortened step to gait with significantly reduced gait speed. limited arm swing bilaterally, then later excessive arm swing when cued to relax UEs. Pt drifts laterally with multiple minor losses of balance   Stairs Stairs: Yes Stairs assistance: Min assist;Mod assist Stair Management: No rails Number of Stairs: 2 General stair comments: modA for one posterior LOB when descending steps   Wheelchair Mobility    Modified Rankin (Stroke Patients Only)       Balance Overall balance assessment: Needs assistance Sitting-balance support: No upper extremity supported;Feet supported Sitting balance-Leahy Scale: Fair     Standing balance support: No upper extremity supported Standing balance-Leahy Scale: Fair                   Standardized Balance Assessment Standardized Balance Assessment : Berg Balance Test Berg Balance Test Sit to Stand: Able to stand  independently using hands Standing Unsupported: Able to stand 2 minutes with supervision Sitting with Back Unsupported but Feet Supported on Floor or Stool: Able to sit safely and securely 2 minutes Stand to Sit: Controls descent by using hands Transfers: Able to transfer with verbal cueing and /or supervision Standing Unsupported with Eyes Closed: Able to stand 3 seconds Standing Ubsupported with Feet Together: Able to place feet together independently but unable to hold for 30 seconds From Standing, Reach Forward with Outstretched Arm: Reaches forward but needs supervision From Standing Position, Pick up Object from Floor: Able to pick up shoe, needs supervision From Standing Position, Turn to Look Behind Over each Shoulder: Needs supervision  when turning Turn 360 Degrees: Needs close supervision or verbal cueing Standing Unsupported, Alternately Place Feet on Step/Stool: Needs  assistance to keep from falling or unable to try Standing Unsupported, One Foot in Front: Needs help to step but can hold 15 seconds Standing on One Leg: Tries to lift leg/unable to hold 3 seconds but remains standing independently Total Score: 27        Cognition Arousal/Alertness: Awake/alert Behavior During Therapy: Flat affect Overall Cognitive Status: Impaired/Different from baseline Area of Impairment: Attention;Memory;Following commands;Safety/judgement;Awareness;Problem solving                   Current Attention Level: Sustained Memory: Decreased recall of precautions;Decreased short-term memory Following Commands: Follows one step commands with increased time Safety/Judgement: Decreased awareness of safety;Decreased awareness of deficits Awareness: Emergent Problem Solving: Difficulty sequencing;Requires verbal cues;Requires tactile cues        Exercises      General Comments        Pertinent Vitals/Pain Pain Assessment: No/denies pain    Home Living                      Prior Function            PT Goals (current goals can now be found in the care plan section) Acute Rehab PT Goals Patient Stated Goal: to go to rehab Progress towards PT goals: Progressing toward goals    Frequency    Min 4X/week      PT Plan Current plan remains appropriate    Co-evaluation              AM-PAC PT "6 Clicks" Mobility   Outcome Measure  Help needed turning from your back to your side while in a flat bed without using bedrails?: None Help needed moving from lying on your back to sitting on the side of a flat bed without using bedrails?: None Help needed moving to and from a bed to a chair (including a wheelchair)?: A Little Help needed standing up from a chair using your arms (e.g., wheelchair or bedside chair)?: A Little Help needed to walk in hospital room?: A Little Help needed climbing 3-5 steps with a railing? : A Lot 6 Click Score:  19    End of Session Equipment Utilized During Treatment: Gait belt Activity Tolerance: Patient tolerated treatment well Patient left: in chair;with call bell/phone within reach;with chair alarm set;with family/visitor present Nurse Communication: Mobility status PT Visit Diagnosis: Unsteadiness on feet (R26.81);Difficulty in walking, not elsewhere classified (R26.2)     Time: 7341-9379 PT Time Calculation (min) (ACUTE ONLY): 29 min  Charges:  $Gait Training: 8-22 mins $Neuromuscular Re-education: 8-22 mins                     Zenaida Niece, PT, DPT Acute Rehabilitation Pager: [phone]    Zenaida Niece 05/22/2020, 12:38 PM

## 2020-05-22 NOTE — PMR Pre-admission (Signed)
PMR Admission Coordinator Pre-Admission Assessment  Patient: Jimmy Gonzales is an 49 y.o., male MRN: [id number] DOB: Feb 20, 1971 Height: 6' (182.9 cm) Weight: 101.9 kg  Insurance Information HMO:     PPO: Yes     PCP:       IPA:       80/20:       OTHER:  Group #[id number] PRIMARY: [id number]      Policy#: [id number]      Subscriber: patient CM Name:  Philis Fendt (follow up with Elspeth Cho)     Phone#: [phone]     Fax#: [phone] Pre-Cert#: [id number]      Employer: [employer] with brother Benefits:  Phone #: [phone]     Name: Thora Lance. Date: 09/28/19-09/26/20     Deduct: $3500 (met $3500)    Out of Pocket Max: $3000 (met $3000)      Life Max: N/A CIR: 70% with auth      SNF: 70% with auth limited to 60 days Outpatient: 30 combined visits     Co-Pay: $50 per visit Home Health: 70% with auth      Co-Pay: 30% DME: 70%     Co-Pay: 30% Providers: in network  SECONDARY: None      Policy#:       Phone#:    Development worker, community:        Phone#:    The Engineer, petroleum" for patients in Inpatient Rehabilitation Facilities with attached "Privacy Act [place] Records" was provided and verbally reviewed with: N/A  Emergency Contact Information Contact Information    Name Relation Home Work Mobile   [place] Spouse   [phone]      Current Medical History  Patient Admitting Diagnosis: Debility post GI bleed and TIPS procedure  History of Present Illness: A 49 year old male who has a past medical history significant for drinking 1/2 gallon of hard liquor a day who presented to [facility] with a chief complaint of hematemesis on August 18. Apparently he had developed abdominal pain and nausea and vomiting which was initially nonbilious nonbloody for 2 days prior to admission. He was on the way to see his physician on August 18 when he suddenly developed hematemesis. In the emergency  department he was noted to have a large ventral hernia which was incarcerated based on CT scanning. This was easily reduced and he was admitted to the intensive care unit on an octreotide infusion. However, hematemesis and hematochezia persisted. He required intubation, central line placement, vasopressor infusion, multiple blood transfusions, and emergent endoscopy. Unfortunately endoscopy was incomplete as the patient had significant bleeding. 7 bands were placed but esophageal variceal bleeding was unable to be controlled. Reportedly Covid negative. He was transferred to [facility] for emergent TIPS. 2 units PRBC given during the procedure. PCCM accepted the patient for ICU admission.  PT/OT evaluations were completed with recommendations for inpatient rehab.  Patient's medical record from [facility] has been reviewed by the rehabilitation admission coordinator and physician.  Past Medical History  History reviewed. No pertinent past medical history.  Family History   family history is not on file.  Prior Rehab/Hospitalizations Has the patient had prior rehab or hospitalizations prior to admission? No  Has the patient had major surgery during 100 days prior to admission? No   Current Medications  Current Facility-Administered Medications:  .  0.9 %  sodium chloride infusion, 250 mL, Intravenous, Continuous, Bowser, Laurel Dimmer, NP, Stopped  at 05/21/20 1746 .  albumin human 25 % solution 50 g, 50 g, Intravenous, Once, Baron-Johnson, Alison, PA-C .  amLODipine (NORVASC) tablet 5 mg, 5 mg, Oral, Daily, Pahwani, Ravi, MD, 5 mg at 05/23/20 0940 .  cefTRIAXone (ROCEPHIN) 2 g in sodium chloride 0.9 % 100 mL IVPB, 2 g, Intravenous, Q24H, Agarwala, Ravi, MD, Last Rate: 200 mL/hr at 05/22/20 1549, 2 g at 05/22/20 1549 .  Chlorhexidine Gluconate Cloth 2 % PADS 6 each, 6 each, Topical, Daily, Bowser, Laurel Dimmer, NP, 6 each at 05/22/20 2015 .  feeding supplement (ENSURE ENLIVE)  (ENSURE ENLIVE) liquid 237 mL, 237 mL, Oral, BID BM, Agarwala, Ravi, MD, 237 mL at 05/23/20 0942 .  hydrALAZINE (APRESOLINE) injection 10-20 mg, 10-20 mg, Intravenous, Q4H PRN, Corey Harold, NP, 20 mg at 05/19/20 0554 .  lactulose (CHRONULAC) 10 GM/15ML solution 20 g, 20 g, Oral, BID, Agarwala, Ravi, MD, 20 g at 05/23/20 0940 .  levothyroxine (SYNTHROID) tablet 25 mcg, 25 mcg, Oral, Q0600, Kipp Brood, MD, 25 mcg at 05/23/20 0549 .  LORazepam (ATIVAN) injection 2 mg, 2 mg, Intravenous, Q4H PRN, Agarwala, Ravi, MD .  oxyCODONE (Oxy IR/ROXICODONE) immediate release tablet 5 mg, 5 mg, Oral, Q6H PRN, Bowser, Laurel Dimmer, NP .  pantoprazole (PROTONIX) EC tablet 40 mg, 40 mg, Oral, BID, 40 mg at 05/23/20 0939 **FOLLOWED BY** [START ON 06/03/2020] pantoprazole (PROTONIX) EC tablet 40 mg, 40 mg, Oral, Daily, Agarwala, Ravi, MD .  predniSONE (DELTASONE) tablet 40 mg, 40 mg, Oral, Q breakfast, 40 mg at 05/23/20 0752 **FOLLOWED BY** [START ON 06/11/2020] predniSONE (DELTASONE) tablet 30 mg, 30 mg, Oral, Q breakfast **FOLLOWED BY** [START ON 06/15/2020] predniSONE (DELTASONE) tablet 20 mg, 20 mg, Oral, Q breakfast **FOLLOWED BY** [START ON 06/19/2020] predniSONE (DELTASONE) tablet 10 mg, 10 mg, Oral, Q breakfast **FOLLOWED BY** [START ON 06/22/2020] predniSONE (DELTASONE) tablet 5 mg, 5 mg, Oral, Q breakfast, Agarwala, Ravi, MD .  rifaximin (XIFAXAN) tablet 550 mg, 550 mg, Oral, BID, Blenda Nicely, RPH, 550 mg at 05/23/20 5852 .  sodium chloride flush (NS) 0.9 % injection 10-40 mL, 10-40 mL, Intracatheter, Q12H, Icard, Bradley L, DO, 10 mL at 05/23/20 0942 .  sodium chloride flush (NS) 0.9 % injection 10-40 mL, 10-40 mL, Intracatheter, PRN, Icard, Bradley L, DO .  thiamine tablet 100 mg, 100 mg, Oral, Daily, Agarwala, Ravi, MD, 100 mg at 05/23/20 7782  Patients Current Diet:  Diet Order            Diet 2 gram sodium Room service appropriate? Yes; Fluid consistency: Thin  Diet effective now                  Precautions / Restrictions Precautions Precautions: Fall Restrictions Weight Bearing Restrictions: No   Has the patient had 2 or more falls or a fall with injury in the past year? No  Prior Activity Level Community (5-7x/wk): Went out daily, was driving, works Medical laboratory scientific officer.  Prior Functional Level Self Care: Did the patient need help bathing, dressing, using the toilet or eating? Independent  Indoor Mobility: Did the patient need assistance with walking from room to room (with or without device)? Independent  Stairs: Did the patient need assistance with internal or external stairs (with or without device)? Independent  Functional Cognition: Did the patient need help planning regular tasks such as shopping or remembering to take medications? Independent  Home Assistive Devices / Equipment Home Equipment: None  Prior Device Use: Indicate devices/aids used by the patient prior to  current illness, exacerbation or injury? None  Current Functional Level Cognition  Overall Cognitive Status: Impaired/Different from baseline Current Attention Level: Sustained Orientation Level: Oriented to person, Disoriented to place, Disoriented to time, Disoriented to situation Following Commands: Follows one step commands with increased time Safety/Judgement: Decreased awareness of safety, Decreased awareness of deficits General Comments: pt adamant about the date 8/26, Friday despite max directional verbal cues and re-direction.     Extremity Assessment (includes Sensation/Coordination)  Upper Extremity Assessment: RUE deficits/detail, LUE deficits/detail RUE Deficits / Details: grossly 3+/5, decreaesd coordination  RUE Coordination: decreased fine motor, decreased gross motor LUE Deficits / Details: grossly 3+/5, decreaesd coordination  LUE Coordination: decreased fine motor, decreased gross motor  Lower Extremity Assessment: Defer to PT evaluation    ADLs  Overall ADL's : Needs  assistance/impaired Grooming: Minimal assistance, Sitting Upper Body Bathing: Minimal assistance, Sitting Lower Body Bathing: Moderate assistance, +2 for physical assistance, +2 for safety/equipment, Sit to/from stand Upper Body Dressing : Minimal assistance, Sitting Lower Body Dressing: Maximal assistance, +2 for physical assistance, +2 for safety/equipment, Sit to/from stand Toilet Transfer: Moderate assistance, +2 for physical assistance, +2 for safety/equipment, Ambulation Toilet Transfer Details (indicate cue type and reason): simulated to recliner  Functional mobility during ADLs: Moderate assistance, +2 for physical assistance, +2 for safety/equipment, Cueing for sequencing, Cueing for safety General ADL Comments: pt limited by impaired cognition, decreased coordination, impaired balance and generalized weakness     Mobility  Overal bed mobility: Needs Assistance Bed Mobility: Supine to Sit Supine to sit: Supervision General bed mobility comments: max directional verbal cues to complete task, pt with frequent stops asking, what do you want me to do?    Transfers  Overall transfer level: Needs assistance Equipment used: None Transfers: Sit to/from Stand Sit to Stand: Min assist General transfer comment: minA to power up and minA to slow descent    Ambulation / Gait / Stairs / Wheelchair Mobility  Ambulation/Gait Ambulation/Gait assistance: Herbalist (Feet): 250 Feet Assistive device: 1 person hand held assist Gait Pattern/deviations: Step-to pattern, Drifts right/left, Wide base of support General Gait Details: pt with shortened step to gait with significantly reduced gait speed. limited arm swing bilaterally, then later excessive arm swing when cued to relax UEs. Pt drifts laterally with multiple minor losses of balance Gait velocity: reduced Gait velocity interpretation: <1.8 ft/sec, indicate of risk for recurrent falls Stairs: Yes Stairs assistance: Min  assist, Mod assist Stair Management: No rails Number of Stairs: 2 General stair comments: modA for one posterior LOB when descending steps    Posture / Balance Dynamic Sitting Balance Sitting balance - Comments: requires min guard to min assist dynamically with donning socks  Balance Overall balance assessment: Needs assistance Sitting-balance support: No upper extremity supported, Feet supported Sitting balance-Leahy Scale: Fair Sitting balance - Comments: requires min guard to min assist dynamically with donning socks  Standing balance support: No upper extremity supported Standing balance-Leahy Scale: Fair Standing balance comment: dependent on UE assist Standardized Balance Assessment Standardized Balance Assessment : Berg Balance Test Berg Balance Test Sit to Stand: Able to stand  independently using hands Standing Unsupported: Able to stand 2 minutes with supervision Sitting with Back Unsupported but Feet Supported on Floor or Stool: Able to sit safely and securely 2 minutes Stand to Sit: Controls descent by using hands Transfers: Able to transfer with verbal cueing and /or supervision Standing Unsupported with Eyes Closed: Able to stand 3 seconds Standing Ubsupported with Feet Together: Able to  place feet together independently but unable to hold for 30 seconds From Standing, Reach Forward with Outstretched Arm: Reaches forward but needs supervision From Standing Position, Pick up Object from Floor: Able to pick up shoe, needs supervision From Standing Position, Turn to Look Behind Over each Shoulder: Needs supervision when turning Turn 360 Degrees: Needs close supervision or verbal cueing Standing Unsupported, Alternately Place Feet on Step/Stool: Needs assistance to keep from falling or unable to try Standing Unsupported, One Foot in Front: Needs help to step but can hold 15 seconds Standing on One Leg: Tries to lift leg/unable to hold 3 seconds but remains standing  independently Total Score: 27    Special needs/care consideration Skin abrasion: buttocks-Right/upper; cracking: buttocks-mid; incision: Right abdomen, Behavioral consideration Is encephalopathic, Bladder and bowel incontinence and Designated visitor wife, [place] (from acute therapy documentation) Living Arrangements: Spouse/significant other Available Help at Discharge: Family, Available 24 hours/day Type of Home: House Home Layout: Two level, Able to live on main level with bedroom/bathroom Home Access: Stairs to enter [employer] of Steps: 1 Bathroom Shower/Tub: Multimedia programmer: Handicapped height  Discharge Living Setting Plans for Discharge Living Setting: Patient's home, House, Lives with (comment) (Lives with wife, Anne Ng) Type of Home at Discharge: House Discharge Home Layout: Two level, Full bath on main level, Able to live on main level with bedroom/bathroom Alternate Level Stairs-Number of Steps: 17-18 steps, but does not have to go upstairs Discharge Home Access: Stairs to enter Entrance Stairs-Rails: None Entrance Stairs-Number of Steps: 1 step entry Discharge Bathroom Shower/Tub: Walk-in shower, Door Discharge Bathroom Toilet: Standard Discharge Bathroom Accessibility: Yes How Accessible: Accessible via walker Does the patient have any problems obtaining your medications?: No  Social/Family/Support Systems Patient Roles: Spouse, Parent (Has wife and a 89 yo child.) Contact Information: Jaleil Renwick - wife - [phone] Anticipated Caregiver: wife Ability/Limitations of Caregiver: Wife does not work and can assist and provide supervision after rehab stay Caregiver Availability: 24/7 Discharge Plan Discussed with Primary Caregiver: Yes (Met with wife and patient at the bedside.) Is Caregiver In Agreement with Plan?: Yes Does Caregiver/Family have Issues with Lodging/Transportation while Pt is in Rehab?:  No  Goals Patient/Family Goal for Rehab: PT/OT S/min assist goals Expected length of stay: 10-14 days Cultural Considerations: None Pt/Family Agrees to Admission and willing to participate: Yes Program Orientation Provided & Reviewed with Pt/Caregiver Including Roles  & Responsibilities: Yes  Decrease burden of Care through IP rehab admission: N/A  Possible need for SNF placement upon discharge: Not anticipated  Patient Condition: I have reviewed medical records from [facility], spoken with CM, and patient and spouse. I met with patient at the bedside for inpatient rehabilitation assessment.  Patient will benefit from ongoing PT, OT and SLP, can actively participate in 3 hours of therapy a day 5 days of the week, and can make measurable gains during the admission.  Patient will also benefit from the coordinated team approach during an Inpatient Acute Rehabilitation admission.  The patient will receive intensive therapy as well as Rehabilitation physician, nursing, social worker, and care management interventions.  Due to bladder management, bowel management, safety, skin/wound care, disease management, medication administration, pain management and patient education the patient requires 24 hour a day rehabilitation nursing.  The patient is currently Min A 250' with mobility and Min A-Max A +2 with basic ADLs.  Discharge setting and therapy post discharge at home with home health is anticipated.  Patient has agreed to  participate in the Acute Inpatient Rehabilitation Program and will admit today.  Preadmission Screen Completed By:  Bethel Born, 05/23/2020 11:50 AM ______________________________________________________________________   Discussed status with Dr. Posey Pronto on 05/23/20  at 11:50 AM and received approval for admission today.  Admission Coordinator:  Bethel Born, CCC-SLP, time 11:50 AM Sudie Grumbling 05/23/20    Assessment/Plan: Diagnosis: Debility 1. Does the  need for close, 24 hr/day Medical supervision in concert with the patient's rehab needs make it unreasonable for this patient to be served in a less intensive setting? Potentially 2. Co-Morbidities requiring supervision/potential complications: Alcohol abuse, cirrhosis, hypokalemia (continue to monitor and replete as necessary), leukocytosis (repeat labs, cont to monitor for signs and symptoms of infection, further workup if indicated) 3. Due to safety, skin/wound care, disease management, medication administration and patient education, does the patient require 24 hr/day rehab nursing? Potentially 4. Does the patient require coordinated care of a physician, rehab nurse, PT, OT to address physical and functional deficits in the context of the above medical diagnosis(es)? Potentially Addressing deficits in the following areas: balance, endurance, locomotion, bathing, dressing, cognition and psychosocial support 5. Can the patient actively participate in an intensive therapy program of at least 3 hrs of therapy 5 days a week? Yes 6. The potential for patient to make measurable gains while on inpatient rehab is good 7. Anticipated functional outcomes upon discharge from inpatient rehab: modified independent PT, modified independent OT, n/a SLP 8. Estimated rehab length of stay to reach the above functional goals is: 4-7 days. 9. Anticipated discharge destination: Home 10. Overall Rehab/Functional Prognosis: good and fair   MD Signature: Delice Lesch, MD, ABPMR

## 2020-05-22 NOTE — Progress Notes (Signed)
IP rehab admissions:  I met with patient and his wife, Jimmy Gonzales, at the bedside.  Patient is slow to speak and encephalopathic.  I gave wife rehab booklets and discussed potential admit to CIR.  I will open the case with [employer] and request inpatient rehab admission.  I will follow up as soon as I hear back from insurance case manager.  Call me for questions.  [phone]

## 2020-05-22 NOTE — Progress Notes (Signed)
[facility] Progress Note  Jimmy Gonzales 49 y.o. [birth year]  CC: Cirrhosis, variceal bleeding s/p TIPS  Subjective: Patient able to state location, situation, president, year, though does seem confused with regular conversation (was talking about [employer]).  Denies any complaints.    ROS : Unable to obtain due to patient status (confused)  Objective: Vital signs in last 24 hours: Vitals:   05/22/20 0800 05/22/20 1200  BP: (!) 156/86   Pulse: 83   Resp: 14   Temp: 98.5 F (36.9 C) 99.5 F (37.5 C)  SpO2: 95%     Physical Exam:  General:  Awake, somewhat confused, no acute distress; wife at bedside  Head:  Normocephalic, without obvious abnormality, atraumatic  Eyes:  Scleral icterus, EOMs intact  Lungs:   Breathing comfortably on room air  Heart:  Tachycardic but with regular rate  Abdomen:   Distended with mild firmness but non-tender, bowel sounds active all four quadrants    Lab Results: Recent Labs    05/19/20 1646 05/20/20 0546 05/21/20 0756 05/22/20 0444  NA  --    < > 136 140  K  --    < > 2.8* 3.9  CL  --    < > 98 101  CO2  --    < > 24 29  GLUCOSE  --    < > 109* 120*  BUN  --    < > 11 8  CREATININE  --    < > 0.74 0.69  CALCIUM  --    < > 8.2* 8.6*  MG 2.1  --   --   --   PHOS 2.9  --   --   --    < > = values in this interval not displayed.   Recent Labs    05/22/20 0444  AST 138*  ALT 69*  ALKPHOS 173*  BILITOT 6.3*  PROT 6.7  ALBUMIN 2.7*   Recent Labs    05/21/20 0756 05/22/20 0444  WBC 26.5* 20.7*  HGB 11.1* 9.6*  HCT 34.3* 30.5*  MCV 92.2 92.4  PLT PLATELETS APPEAR ADEQUATE 205   No results for input(s): LABPROT, INR in the last 72 hours.  Impression: Variceal bleeding s/p TIPS 05/16/20 -Hemoglobin 9.6, stable, no signs of bleeding -Pt hemodynamically stable  Decompensated cirrhosis, most likely from alcohol use.  MELD score of 35 as of 05/16/2020.  Hepatic discriminant function 68.6 as of  05/16/2020 -T bili 6.3/AST 138/ALT695/ALP 173   Hepatic encephalopathy: On lactulose and corticosteroids  Hypokalemia, resolved  Plan: Continue lactulose 20g BID, titrate for 2-3 soft BMs/day.  Continue Rifaximin 550 mg BID.  Continue Protonix 40 mg twice daily.  Plain films today due to ongoing distention.  If negative, consider ultrasound to assess for ascites despite TIPS.  Continue to monitor H&H with transfusion as needed to maintain hemoglobin greater than 7.  [facility] will follow.  Salley Slaughter PA-C 05/22/2020, 12:17 PM  Contact #  [phone]

## 2020-05-22 NOTE — Progress Notes (Addendum)
PROGRESS NOTE    [facility]  VOZ:[id number] DOB: [birth year] DOA: 05/15/2020 PCP: Patient, No Pcp Per   Brief Narrative:  This is a 49 year old male who has a past medical history significant for drinking 1/2 gallon of hard liquor a day who presented to [facility] with a chief complaint of hematemesis on August 18.  Apparently he had developed abdominal pain and nausea and vomiting which was initially nonbilious nonbloody for 2 days prior to admission.  He was on the way to see his physician on August 18 when he suddenly developed hematemesis.  In the emergency department he was noted to have a large ventral hernia which was incarcerated based on CT scanning.  This was easily reduced and he was admitted to the intensive care unit on an octreotide infusion.  However, hematemesis and hematochezia persisted.  He required intubation, central line placement, vasopressor infusion, multiple blood transfusions, and emergent endoscopy.  Unfortunately endoscopy was incomplete as the patient had significant bleeding.  7 bands were placed but esophageal variceal bleeding was unable to be controlled.  Reportedly Covid negative. He was transferred to [facility] for emergent TIPS. 2 units PRBC given during the procedure. PCCM accepted the patient for ICU admission.   Significant [facility] Events   8/ 18 admitted [facility] 8/ 19 emergent endoscopy, banding of variceal lesions in esophagus, transferred to [facility] for emergent TIPS procedure 8/ 20 continued blood product administration  8/23 extubated but remains confused 8/24 improving confusion.   Procedures:  August 19 endotracheal tube August 19 right IJ central venous line August 19 arterial line   Significant Diagnostic Tests:  August 18 CT abdomen > high grade SBO with transition at small umbilical hernia containing a portion on the mid jejunum. Cirrhosis with portal venous hypertension and marked abdominal  varices.   Micro Data:  August 19 SARS-CoV-2> negative Hepatitis A IgM ab neg Hepatitis Bs antigen negative Hepatitis B core IGM negative Hepatitis C antibody 0.1   Assessment & Plan:   Active Problems:   Hemorrhagic shock (HCC)   Gastric bleed   Small bowel obstruction (HCC)   Acute respiratory failure with hypoxia (HCC)   Decompensated hepatic cirrhosis ([facility])   Acute kidney injury ([facility])   Acute hypoxic respiratory failure requiring intubation Now extubated saturating well over 90% and comfortable on room air.  acute metabolic encephalopathy , multifactorial etiology including hepatic encephalopathy EtOH abuse disorder, now out of withdrawal window.  He is alert but pleasantly confused.  He knows the year 2021 but missed the place and the month.  Wife at the bedside. -Cont lactulose at lower dose given diarrhea.  - PRN lorazepam/haloperidol.  - Continue thiamine.    ABLA in setting of variceal bleeding and improved hemorrhagic shock. s/p emergent TIPS 8/19, blood product resuscitation  HB now stable and no signs of bleeding.  Hemoglobin 9.6 today. - follow CBC.  GI recommends holding off to beta-blocker now that patient is status post TIPS. - Continue Protonix.  Has received 7 days of prophylaxis with Rocephin.  Pharmacy talked to the GI and they want 10 days of prophylaxis.  Decompensated alcoholic cirrhosis with portal hypertension, MELD 35 S/p TIPS 8/29 Coagulopathy, hyperbilirubinemia  Acute alcoholic hepatitis.  Viral hepatitis ruled out.  He remains on tapering dose of steroids per GI.  Leukocytosis: Likely due to being on steroids.  No signs of infection.  Monitor.  Essential hypertension: Previously he was on clonidine.  Has been off of this and  blood pressure was fairly controlled.  Now it is rising.  We will keep him off of clonidine now and start him on amlodipine 5 mg p.o. daily.   DVT prophylaxis: SCDs Start: 05/15/20 1817   Code Status: Full Code    Family Communication: Wife present at bedside.  Plan of care discussed with her.  Status is: Inpatient  Remains inpatient appropriate because:Inpatient level of care appropriate due to severity of illness   Dispo: The patient is from: Home              Anticipated d/c is to: CIR              Anticipated d/c date is: 2 days              Patient currently is not medically stable to d/c.        Estimated body mass index is 30.47 kg/m as calculated from the following:   Height as of this encounter: 6' (1.829 m).   Weight as of this encounter: 101.9 kg.      Nutritional status:  Nutrition Problem: Increased nutrient needs Etiology: catabolic illness (liver disease)   Signs/Symptoms: estimated needs   Interventions: Refer to RD note for recommendations    Consultants:   GI  Procedures:   TIPS  Antimicrobials:  Anti-infectives (From admission, onward)   Start     Dose/Rate Route Frequency Ordered Stop   05/20/20 1600  cefTRIAXone (ROCEPHIN) 2 g in sodium chloride 0.9 % 100 mL IVPB        2 g 200 mL/hr over 30 Minutes Intravenous Every 24 hours 05/20/20 0936 05/26/20 1559   05/19/20 2200  rifaximin (XIFAXAN) tablet 550 mg        550 mg Oral 2 times daily 05/19/20 2132     05/19/20 1600  cefTRIAXone (ROCEPHIN) 2 g in sodium chloride 0.9 % 100 mL IVPB  Status:  Discontinued        2 g 200 mL/hr over 30 Minutes Intravenous Every 24 hours 05/19/20 0858 05/20/20 0936   05/18/20 1600  cefTRIAXone (ROCEPHIN) 1 g in sodium chloride 0.9 % 100 mL IVPB  Status:  Discontinued        1 g 200 mL/hr over 30 Minutes Intravenous Every 24 hours 05/18/20 1506 05/19/20 0858   05/16/20 1500  rifaximin (XIFAXAN) tablet 550 mg  Status:  Discontinued        550 mg Per Tube 2 times daily 05/16/20 1446 05/19/20 2132   05/15/20 1930  piperacillin-tazobactam (ZOSYN) IVPB 3.375 g  Status:  Discontinued        3.375 g 12.5 mL/hr over 240 Minutes Intravenous Every 8 hours 05/15/20 1920  05/18/20 1506   05/15/20 1825  ceFAZolin (ANCEF) 2-4 GM/100ML-% IVPB       Note to Pharmacy: Tressie Stalker   : cabinet override      05/15/20 1825 05/16/20 0629         Subjective: Patient seen and examined.  Wife at the bedside.  Patient sitting in the bed, alert but pleasantly confused.  He keeps talking about " I am having trouble signing into my spectrum, can you switch me to something else".   Objective: Vitals:   05/22/20 0600 05/22/20 0700 05/22/20 0800 05/22/20 1200  BP: (!) 158/98 (!) 157/84 (!) 156/86   Pulse: 82 81 83   Resp: 14 15 14    Temp:   98.5 F (36.9 C) 99.5 F (37.5 C)  TempSrc:   Oral  Oral  SpO2: 95% 96% 95%   Weight:      Height:        Intake/Output Summary (Last 24 hours) at 05/22/2020 1303 Last data filed at 05/21/2020 2000 Gross per 24 hour  Intake 321.23 ml  Output 810 ml  Net -488.77 ml   Filed Weights   05/16/20 0100 05/18/20 0500 05/22/20 0400  Weight: 102.1 kg 112.2 kg 101.9 kg    Examination:  General exam: Appears calm and comfortable  Respiratory system: Clear to auscultation. Respiratory effort normal. Cardiovascular system: S1 & S2 heard, RRR. No JVD, murmurs, rubs, gallops or clicks. No pedal edema. Gastrointestinal system: Abdomen is nondistended, soft and nontender. No organomegaly or masses felt. Normal bowel sounds heard. Central nervous system: Alert and oriented x1. No focal neurological deficits. Extremities: Symmetric 5 x 5 power. Skin: No rashes, lesions or ulcers    Data Reviewed: I have personally reviewed following labs and imaging studies  CBC: Recent Labs  Lab 05/18/20 0546 05/19/20 1646 05/20/20 0546 05/21/20 0756 05/22/20 0444  WBC 12.2* 14.6* 12.7* 26.5* 20.7*  HGB 8.8* 9.3* 8.9* 11.1* 9.6*  HCT 27.1* 29.4* 28.3* 34.3* 30.5*  MCV 92.2 92.5 93.1 92.2 92.4  PLT 123* 164 140* PLATELETS APPEAR ADEQUATE 161   Basic Metabolic Panel: Recent Labs  Lab 05/16/20 0610 05/16/20 1824 05/18/20 0500  05/18/20 1400 05/18/20 1820 05/19/20 0551 05/19/20 1646 05/20/20 0546 05/21/20 0756 05/22/20 0444  NA 134*   < > 143  --   --  148*  --  140 136 140  K 3.2*   < > 3.6  --   --  3.6  --  3.3* 2.8* 3.9  CL 99   < > 106  --   --  107  --  102 98 101  CO2 21*   < > 25  --   --  28  --  28 24 29   GLUCOSE 163*   < > 147*  --   --  156*  --  119* 109* 120*  BUN 45*   < > 24*  --   --  24*  --  21* 11 8  CREATININE 3.50*   < > 0.98  --   --  0.91  --  0.74 0.74 0.69  CALCIUM 6.5*   < > 7.8*  --   --  8.2*  --  8.4* 8.2* 8.6*  MG 2.1   < > 2.4 2.5* 2.3 2.2 2.1  --   --   --   PHOS 4.9*  --   --  2.3* 2.4* 3.4 2.9  --   --   --    < > = values in this interval not displayed.   GFR: Estimated Creatinine Clearance: 141 mL/min (by C-G formula based on SCr of 0.69 mg/dL). Liver Function Tests: Recent Labs  Lab 05/16/20 0019 05/17/20 0453 05/18/20 0500 05/19/20 0551 05/22/20 0444  AST 109* 354* 332* 220* 138*  ALT 32 82* 95* 85* 69*  ALKPHOS 74 75 109 144* 173*  BILITOT 4.7* 6.4* 6.7* 5.3* 6.3*  PROT 5.2* 5.8* 6.3* 6.6 6.7  ALBUMIN 1.9* 2.7* 2.9* 2.9* 2.7*   No results for input(s): LIPASE, AMYLASE in the last 168 hours. Recent Labs  Lab 05/16/20 0208 05/17/20 0453  AMMONIA 154* 132*   Coagulation Profile: Recent Labs  Lab 05/16/20 1824 05/17/20 0453 05/17/20 1819 05/18/20 0500 05/18/20 1820  INR 1.9* 1.8* 1.7* 1.6* 1.5*   Cardiac Enzymes: No results  for input(s): CKTOTAL, CKMB, CKMBINDEX, TROPONINI in the last 168 hours. BNP (last 3 results) No results for input(s): PROBNP in the last 8760 hours. HbA1C: No results for input(s): HGBA1C in the last 72 hours. CBG: Recent Labs  Lab 05/20/20 1957 05/20/20 2342 05/21/20 0333 05/21/20 0914 05/21/20 2007  GLUCAP 122* 91 97 107* 147*   Lipid Profile: No results for input(s): CHOL, HDL, LDLCALC, TRIG, CHOLHDL, LDLDIRECT in the last 72 hours. Thyroid Function Tests: No results for input(s): TSH, T4TOTAL, FREET4,  T3FREE, THYROIDAB in the last 72 hours. Anemia Panel: No results for input(s): VITAMINB12, FOLATE, FERRITIN, TIBC, IRON, RETICCTPCT in the last 72 hours. Sepsis Labs: Recent Labs  Lab 05/16/20 0018 05/16/20 0610  LATICACIDVEN 5.6* 3.0*    Recent Results (from the past 240 hour(s))  MRSA PCR Screening     Status: Abnormal   Collection Time: 05/16/20  5:52 AM   Specimen: Nasal Mucosa; Nasopharyngeal  Result Value Ref Range Status   MRSA by PCR (A) NEGATIVE Final    INVALID, UNABLE TO DETERMINE THE PRESENCE OF TARGET DUE TO SPECIMEN INTEGRITY. RECOLLECTION REQUESTED.    Comment: CALLED TO RN J CARMICHAEL 05/16/20 AT 6195 Performed at [facility], [place] [address], [place], [place] [address]   MRSA PCR Screening     Status: Abnormal   Collection Time: 05/17/20  8:36 AM   Specimen: Nasal Mucosa; Nasopharyngeal  Result Value Ref Range Status   MRSA by PCR (A) NEGATIVE Final    INVALID, UNABLE TO DETERMINE THE PRESENCE OF TARGET DUE TO SPECIMEN INTEGRITY. RECOLLECTION REQUESTED.    Comment: CRITICAL RESULT CALLED TO, READ BACK BY AND VERIFIED WITH: RN JENNIFER CARMICHAEL AT 1217 05/17/20        The GeneXpert MRSA Assay (FDA approved for NASAL specimens only), is one component of a comprehensive MRSA colonization surveillance program. It is not intended to diagnose MRSA infection nor to guide or monitor treatment for MRSA infections. Performed at [facility], [place] [address], [place], [place] [address]   MRSA PCR Screening     Status: None   Collection Time: 05/17/20  6:17 PM   Specimen: Nasal Mucosa; Nasopharyngeal  Result Value Ref Range Status   MRSA by PCR NEGATIVE NEGATIVE Final    Comment:        The GeneXpert MRSA Assay (FDA approved for NASAL specimens only), is one component of a comprehensive MRSA colonization surveillance program. It is not intended to diagnose MRSA infection nor to guide or monitor treatment for MRSA infections. Performed at [facility], [place] [address] [place], [place] [address]       Radiology Studies: No results found.  Scheduled Meds: . Chlorhexidine Gluconate Cloth  6 each Topical Daily  . feeding supplement (ENSURE ENLIVE)  237 mL Oral BID BM  . lactulose  20 g Oral BID  . levothyroxine  25 mcg Oral Q0600  . pantoprazole  40 mg Oral BID   Followed by  . [START ON 06/03/2020] pantoprazole  40 mg Oral Daily  . predniSONE  40 mg Oral Q breakfast   Followed by  . [START ON 06/11/2020] predniSONE  30 mg Oral Q breakfast   Followed by  . [START ON 06/15/2020] predniSONE  20 mg Oral Q breakfast   Followed by  . [START ON 06/19/2020] predniSONE  10 mg Oral Q breakfast   Followed by  . [START ON 06/22/2020] predniSONE  5 mg Oral Q breakfast  . rifaximin  550 mg Oral BID  . sodium chloride flush  10-40 mL Intracatheter Q12H  . thiamine  100 mg Oral Daily   Continuous Infusions: . sodium chloride Stopped (05/21/20 1746)  . cefTRIAXone (ROCEPHIN)  IV Stopped (05/21/20 1636)     LOS: 7 days   Time spent: 34 minutes   Darliss Cheney, MD [facility]  05/22/2020, 1:03 PM   To contact the attending provider between 7A-7P or the covering provider during after hours 7P-7A, please log into the web site [email or web address].

## 2020-05-22 NOTE — Plan of Care (Signed)
  Problem: Bowel/Gastric: Goal: Will show no signs and symptoms of gastrointestinal bleeding Outcome: Progressing   

## 2020-05-22 NOTE — Progress Notes (Addendum)
Patient with persistent abdominal distention; plain films showed improvement of SBO/ileus.  It is possible patient has residual ascites despite TIPS.   Recommend [place] for IR guided paracentesis with diagnostic and therapeutic ascitic fluid removal if ascites is present.  If greater than or equal to 5L fluid is removed, administer albumin 50g.  [facility] will follow.  Salley Slaughter [facility] Contact #  [phone]

## 2020-05-22 NOTE — Progress Notes (Addendum)
Reached out to GI regarding Rocephin LOT.  GI recommends 10 days of antibiotic.  Stop date in place.   Shameek Nyquist D. Mina Marble, PharmD, BCPS, [place] 05/22/2020, 1:16 PM

## 2020-05-23 ENCOUNTER — Inpatient Hospital Stay (HOSPITAL_COMMUNITY): Payer: BC Managed Care – PPO

## 2020-05-23 LAB — BASIC METABOLIC PANEL
Anion gap: 10 (ref 5–15)
BUN: 7 mg/dL (ref 6–20)
CO2: 26 mmol/L (ref 22–32)
Calcium: 8.2 mg/dL — ABNORMAL LOW (ref 8.9–10.3)
Chloride: 104 mmol/L (ref 98–111)
Creatinine, Ser: 0.71 mg/dL (ref 0.61–1.24)
GFR calc Af Amer: >60 mL/min (ref >60)
GFR calc non Af Amer: >60 mL/min (ref >60)
Glucose, Bld: 123 mg/dL — ABNORMAL HIGH (ref 70–99)
Potassium: 3 mmol/L — ABNORMAL LOW (ref 3.5–5.1)
Sodium: 140 mmol/L (ref 135–145)

## 2020-05-23 LAB — CBC
HCT: 29.4 % — ABNORMAL LOW (ref 39.0–52.0)
Hemoglobin: 9.4 g/dL — ABNORMAL LOW (ref 13.0–17.0)
MCH: 29.7 pg (ref 26.0–34.0)
MCHC: 32 g/dL (ref 30.0–36.0)
MCV: 92.7 fL (ref 80.0–100.0)
Platelets: 262 10*3/uL (ref 150–400)
RBC: 3.17 MIL/uL — ABNORMAL LOW (ref 4.22–5.81)
RDW: 17.5 % — ABNORMAL HIGH (ref 11.5–15.5)
WBC: 21.4 10*3/uL — ABNORMAL HIGH (ref 4.0–10.5)
nRBC: 0.1 % (ref 0.0–0.2)

## 2020-05-23 LAB — MAGNESIUM: Magnesium: 1.8 mg/dL (ref 1.7–2.4)

## 2020-05-23 MED ORDER — POTASSIUM CHLORIDE 10 MEQ/100ML IV SOLN
10.0000 meq | INTRAVENOUS | Status: AC
  Administered 2020-05-23 – 2020-05-24 (×4): 10 meq via INTRAVENOUS
  Filled 2020-05-23 (×4): qty 100

## 2020-05-23 NOTE — Progress Notes (Signed)
Inpatient Rehab Admissions Coordinator:  Pt will not admit to CIR today.  Per Ava Swayze, DO pt will remain in acute setting and have hepatic encephalopathy monitored. Hope to admit pt to CIR next week pending bed availability.   Gayland Curry, [place], [place] Admissions Coordinator [phone]

## 2020-05-23 NOTE — Progress Notes (Signed)
[facility] Progress Note  [facility] 49 y.o. Sep 14, 1971  CC: Cirrhosis, variceal bleeding s/p TIPS  Subjective: Patient able to state location, situation, president, year, though remains confused with regular conversation.  Denies any complaints.    ROS : Patient confused but denies any complaints.   GI: Denies abdominal pain, nausea, vomiting.  Denies melena, hematochezia.   Cardiac: Denies chest pain or shortness of breath.  Objective: Vital signs in last 24 hours: Vitals:   05/23/20 0110 05/23/20 0544  BP: (!) 165/94 (!) 160/92  Pulse: 82 83  Resp: 17 17  Temp: 98.4 F (36.9 C) 98.2 F (36.8 C)  SpO2: 100% 100%    Physical Exam:  General:  Awake, pleasantly confused, no acute distress; wife at bedside  Head:  Normocephalic, without obvious abnormality, atraumatic  Eyes:  Scleral icterus, EOMs intact  Lungs:   Breathing comfortably on room air  Heart:  Regular rate and rhythm  Abdomen:    Soft, mildly distended, nontender, bowel sounds active all four quadrants    Lab Results: Recent Labs    05/22/20 0444 05/23/20 0149  NA 140 140  K 3.9 3.0*  CL 101 104  CO2 29 26  GLUCOSE 120* 123*  BUN 8 7  CREATININE 0.69 0.71  CALCIUM 8.6* 8.2*  MG  --  1.8   Recent Labs    05/22/20 0444  AST 138*  ALT 69*  ALKPHOS 173*  BILITOT 6.3*  PROT 6.7  ALBUMIN 2.7*   Recent Labs    05/22/20 0444 05/23/20 0149  WBC 20.7* 21.4*  HGB 9.6* 9.4*  HCT 30.5* 29.4*  MCV 92.4 92.7  PLT 205 262   No results for input(s): LABPROT, INR in the last 72 hours.  Impression: Variceal bleeding s/p TIPS 05/16/20 -Hemoglobin 9.4, stable, no signs of bleeding -Pt hemodynamically stable  Decompensated cirrhosis, most likely from alcohol use.  MELD score of 35 as of 05/16/2020.  Hepatic discriminant function 68.6 as of 05/16/2020 -T bili 6.3/AST 138/ALT695/ALP 173 as of 8/26   Hepatic encephalopathy: On lactulose and corticosteroids  Hypokalemia, K+  3.0  Plan: Continue lactulose 20g BID, titrate for 2-3 soft BMs/day.  Continue Rifaximin 550 mg BID.  Continue Protonix 40 mg twice daily.  Recommend [place] for IR guided paracentesis with diagnostic and therapeutic ascitic fluid removal if ascites is present.  If greater than or equal to 5L fluid is removed, administer albumin 50g.  Continue to monitor H&H with transfusion as needed to maintain hemoglobin greater than 7.  [facility] will follow.  Salley Slaughter PA-C 05/23/2020, 10:55 AM  Contact #  [phone]

## 2020-05-23 NOTE — Progress Notes (Signed)
Inpatient Rehab Admissions Coordinator:  There is a bed available in CIR for this pt. Will admit pt today.  Ava Swayze, DO, NSG, TOC made aware.   Gayland Curry, [place], [place] Admissions Coordinator [phone]

## 2020-05-23 NOTE — Progress Notes (Signed)
PROGRESS NOTE  Jimmy Gonzales GDJ:[id number] DOB: 1971/01/01 DOA: 05/15/2020 PCP: Patient, No Pcp Per  Brief History   This is a 49 year old male who has a past medical history significant for drinking 1/2 gallon of hard liquor a day who presented to [facility] with a chief complaint of hematemesis on August 18. Apparently he had developed abdominal pain and nausea and vomiting which was initially nonbilious nonbloody for 2 days prior to admission. He was on the way to see his physician on August 18 when he suddenly developed hematemesis. In the emergency department he was noted to have a large ventral hernia which was incarcerated based on CT scanning. This was easily reduced and he was admitted to the intensive care unit on an octreotide infusion. However, hematemesis and hematochezia persisted. He required intubation, central line placement, vasopressor infusion, multiple blood transfusions, and emergent endoscopy. Unfortunately endoscopy was incomplete as the patient had significant bleeding. 7 bands were placed but esophageal variceal bleeding was unable to be controlled. Reportedly Covid negative. He was transferred to [facility] for emergent TIPS. 2 units PRBC given during the procedure. PCCM accepted the patient for ICU admission.   Significant Hospital Events   8/ 18 admitted [facility] 8/ 19 emergent endoscopy, banding of variceal lesions in esophagus, transferred to [facility] for emergent TIPS procedure 8/ 20 continued blood product administration 8/23 extubated but remains confused 8/24 improving confusion.  Procedures:  August 19 endotracheal tube August 19 right IJ central venous line August 19 arterial line   Significant Diagnostic Tests:  August 18 CT abdomen >high grade SBO with transition at small umbilical hernia containing a portion on the mid jejunum. Cirrhosis with portal venous hypertension and marked abdominal varices.    Micro Data:  August 19 SARS-CoV-2> negative Hepatitis A IgM ab neg Hepatitis Bs antigen negative Hepatitis B core IGM negative Hepatitis C antibody 0.1  On 05/23/2020 the patient went to IT for paracentesis. However he was found to have insufficient fluid for extraction.  Consultants  . Gastroenterology . PCCM . Interventional radiology  Procedures  . TIPS  Antibiotics   Anti-infectives (From admission, onward)   Start     Dose/Rate Route Frequency Ordered Stop   05/20/20 1600  cefTRIAXone (ROCEPHIN) 2 g in sodium chloride 0.9 % 100 mL IVPB        2 g 200 mL/hr over 30 Minutes Intravenous Every 24 hours 05/20/20 0936 05/26/20 1559   05/19/20 2200  rifaximin (XIFAXAN) tablet 550 mg        550 mg Oral 2 times daily 05/19/20 2132     05/19/20 1600  cefTRIAXone (ROCEPHIN) 2 g in sodium chloride 0.9 % 100 mL IVPB  Status:  Discontinued        2 g 200 mL/hr over 30 Minutes Intravenous Every 24 hours 05/19/20 0858 05/20/20 0936   05/18/20 1600  cefTRIAXone (ROCEPHIN) 1 g in sodium chloride 0.9 % 100 mL IVPB  Status:  Discontinued        1 g 200 mL/hr over 30 Minutes Intravenous Every 24 hours 05/18/20 1506 05/19/20 0858   05/16/20 1500  rifaximin (XIFAXAN) tablet 550 mg  Status:  Discontinued        550 mg Per Tube 2 times daily 05/16/20 1446 05/19/20 2132   05/15/20 1930  piperacillin-tazobactam (ZOSYN) IVPB 3.375 g  Status:  Discontinued        3.375 g 12.5 mL/hr over 240 Minutes Intravenous Every 8 hours 05/15/20 1920 05/18/20  1506   05/15/20 1825  ceFAZolin (ANCEF) 2-4 GM/100ML-% IVPB       Note to Pharmacy: Tressie Stalker   : cabinet override      05/15/20 1825 05/16/20 0629    .  Subjective  The patient is resting comfortably. He is confused. No acute distress.  Objective   Vitals:  Vitals:   05/23/20 0544 05/23/20 1517  BP: (!) 160/92 (!) 162/93  Pulse: 83 89  Resp: 17 18  Temp: 98.2 F (36.8 C) 98.3 F (36.8 C)  SpO2: 100% 96%   Exam:  Constitutional:   . The patient is awake, alert, and oriented x 1. Not oriented to place, situation, or date. No acute distress. Respiratory:  . No increased work of breathing. . No wheezes, rales, or rhonchi . No tactile fremitus Cardiovascular:  . Regular rate and rhythm . No murmurs, ectopy, or gallups. . No lateral PMI. No thrills. Abdomen:  . Abdomen is soft, non-tender, non-distended . No hernias, masses, or organomegaly . Normoactive bowel sounds.  Musculoskeletal:  . No cyanosis, clubbing, or edema Skin:  . No rashes, lesions, ulcers . palpation of skin: no induration or nodules Neurologic:  . CN 2-12 intact . Sensation all 4 extremities intact Psychiatric:  . Mental status o Mood, affect appropriate o Orientation to person, place, time  . judgment and insight appear intact  I have personally reviewed the following:   Today's Data  . Vitals, BMP, CBC  Micro Data  . MRSA by PCR negative.  Imaging  . Abdominal x-ray  Scheduled Meds: . amLODipine  5 mg Oral Daily  . Chlorhexidine Gluconate Cloth  6 each Topical Daily  . feeding supplement (ENSURE ENLIVE)  237 mL Oral BID BM  . lactulose  20 g Oral BID  . levothyroxine  25 mcg Oral Q0600  . pantoprazole  40 mg Oral BID   Followed by  . [START ON 06/03/2020] pantoprazole  40 mg Oral Daily  . predniSONE  40 mg Oral Q breakfast   Followed by  . [START ON 06/11/2020] predniSONE  30 mg Oral Q breakfast   Followed by  . [START ON 06/15/2020] predniSONE  20 mg Oral Q breakfast   Followed by  . [START ON 06/19/2020] predniSONE  10 mg Oral Q breakfast   Followed by  . [START ON 06/22/2020] predniSONE  5 mg Oral Q breakfast  . rifaximin  550 mg Oral BID  . sodium chloride flush  10-40 mL Intracatheter Q12H  . thiamine  100 mg Oral Daily   Continuous Infusions: . sodium chloride Stopped (05/21/20 1746)  . albumin human    . cefTRIAXone (ROCEPHIN)  IV 2 g (05/23/20 1627)    Active Problems:   Hemorrhagic shock (HCC)   Gastric  bleed   Small bowel obstruction (HCC)   Acute respiratory failure with hypoxia (HCC)   Decompensated hepatic cirrhosis ([place])   Acute kidney injury ([place])   LOS: 8 days   A & P  Acute hypoxic respiratory failure requiring intubation: Now extubated saturating well over 90% and comfortable on room air.  Acute metabolic encephalopathy , multifactorial etiology including hepatic encephalopathy: Ongoing. EtOH abuse disorder, now out of withdrawal window.  He thinks he is in a studio alone, although he is talking to his nurse and me.  He knows the year 2021 but missed the place and the month. Cont lactulose at lower dose given diarrhea. PRN lorazepam/haloperidol. Continue thiamine.    ABLA in setting of variceal  bleeding and improved hemorrhagic shock. s/p emergent TIPS 8/19, blood product resuscitation. HB now stable and no signs of bleeding.  Hemoglobin 9.6 today GI recommends holding off to beta-blocker now that patient is status post TIPS. Continue Protonix.  Has received 7 days of prophylaxis with Rocephin.  Pharmacy talked to the GI and they want 10 days of prophylaxis.  Decompensated alcoholic cirrhosis with portal hypertension, MELD 35 S/p TIPS 8/29. Now encephalopathic. Continued coagulopathy, hyperbilirubinemia with acute alcoholic hepatitis.  Viral hepatitis ruled out.  He remains on tapering dose of steroids per GI.  Leukocytosis: Likely due to being on steroids.  No signs of infection.  Monitor.  Essential hypertension: Previously he was on clonidine.  Has been off of this and blood pressure was fairly controlled.  Now it is rising.  We will keep him off of clonidine now and start him on amlodipine 5 mg p.o. daily.  I have seen and examined this patient myself. I have spent 35 minutes in his evaluation and care.  DVT prophylaxis: SCDs Start: 05/15/20 1817 Code Status: Full Code  Family Communication: Wife present at bedside.  Plan of care discussed with her. Disposition:   Status is: Inpatient  Remains inpatient appropriate because:Inpatient level of care appropriate due to severity of illness  Dispo: The patient is from: Home  Anticipated d/c is to: CIR  Anticipated d/c date is: 2 days  Patient currently is not medically stable to d/c. Leanndra Pember, DO [facility] Direct contact: see [email or web address]  7PM-7AM contact night coverage as above 05/23/2020, 6:52 PM  LOS: 8 days

## 2020-05-23 NOTE — H&P (Signed)
Physical Medicine and Rehabilitation Admission H&P    CC: debility.    HPI: Jimmy Gonzales is 49 year old male with history of cirrhosis of liver, alcohol abuse who was admitted on via [facility] 05/15/20 with massive hematemesis with hemorrhagic shock and inability to treat bleeding. He required emergent TIPS with embolization of variceal trunks to esophageal varices by Dr. Linard Millers. He was required 2 units PRBC and OGT. He had frank blood via OGT as well as rectal tube as hypotension with bouts of agitation due to hepatic encephalopathy--ammonia level 154. neprology consulted for input on AKI and recommended supportive care.  He tolerated extubation by 08/23 and on regular diet as no signs of dysphagia.    Dr. Cristina Gong following for input and recommended monitoring of H/H as well as PPI bid,  Lactulose, corticosteroids  and Xifaxan. To complete 10 day course of Rocephin for SB  On SBO noted but tolerating po's with liquid stools. He developed abdominal pain due to progressive distension and abdominal ultrasound without significant ascites. He continued to have confusion with leucocytosis--WBC up to 21.9 today, hypokalemia, cognitive deficits as well as unsteady gait with high fall risk.  Required haldol and Seroquel--> Seroquel has been discontinued due to incontinence/wife's concerns. Therapy on going and patient with balance deficits as well as difficulty with higher level cognitive tasks.  Patient with functional deficits and CIR recommended for follow up therapy.      Review of Systems  Constitutional: Negative for chills and fever.  HENT: Negative for hearing loss and tinnitus.   Eyes: Negative for blurred vision and double vision.  Respiratory: Negative for cough and shortness of breath.   Cardiovascular: Negative for chest pain, palpitations and leg swelling.  Gastrointestinal: Negative for abdominal pain, heartburn and nausea.  Genitourinary: Negative for dysuria and urgency.    Musculoskeletal: Positive for joint pain (bilateral hip pain after walking). Negative for myalgias.  Skin: Positive for itching (on back). Negative for rash.  Neurological: Negative for dizziness, sensory change, speech change, weakness and headaches.  Psychiatric/Behavioral: The patient is nervous/anxious. The patient does not have insomnia.      Past Medical History:  Diagnosis Date  . HTN (hypertension)   . Hypothyroid      Past Surgical History:  Procedure Laterality Date  . IR ANGIOGRAM SELECTIVE EACH ADDITIONAL VESSEL  05/16/2020  . IR ANGIOGRAM SELECTIVE EACH ADDITIONAL VESSEL  05/16/2020  . IR ANGIOGRAM SELECTIVE EACH ADDITIONAL VESSEL  05/16/2020  . IR EMBO ART  VEN HEMORR LYMPH EXTRAV  INC GUIDE ROADMAPPING  05/16/2020  . IR TIPS  05/16/2020  . RADIOLOGY WITH ANESTHESIA N/A 05/15/2020   Procedure: TIPS PROCEDURE;  Surgeon: Radiologist, Medication, MD;  Location: [place];  Service: Radiology;  Laterality: N/A;    Family History  Problem Relation Age of Onset  . Lung cancer Mother   . Congestive Heart Failure Father      Social History:  Married. Works in Architect. has an unknown smoking status. His smokeless tobacco use includes chew--one can/day. He reports current alcohol used   1/4 to 1/2 gallon of liquor.  No history on file for drug use.    Allergies: No Known Allergies    Medications Prior to Admission  Medication Sig Dispense Refill  . bisoprolol-hydrochlorothiazide (ZIAC) 5-6.25 MG tablet Take 1 tablet by mouth daily.    . cloNIDine (CATAPRES) 0.1 MG tablet Take 0.1 mg by mouth 3 (three) times daily.    Marland Kitchen levothyroxine (SYNTHROID) 25 MCG tablet  Take 25 mcg by mouth daily.    . Naphazoline-Pheniramine (OPCON-A) 0.027-0.315 % SOLN Place 1 drop into both eyes daily as needed (dry eyes/irritation).    Marland Kitchen omega-3 acid ethyl esters (LOVAZA) 1 g capsule Take 2 capsules by mouth daily.    Marland Kitchen omeprazole (PRILOSEC) 20 MG capsule Take 20 mg by mouth daily.      Drug  Regimen Review  Drug regimen was reviewed and remains appropriate with no significant issues identified  Home: Home Living Family/patient expects to be discharged to:: Private residence Living Arrangements: Spouse/significant other Available Help at Discharge: Family, Available 24 hours/day Type of Home: House Home Access: Stairs to enter Technical brewer of Steps: 1 Home Layout: Two level, Able to live on main level with bedroom/bathroom Bathroom Shower/Tub: Multimedia programmer: Handicapped height Home Equipment: None   Functional History: Prior Function Level of Independence: Independent Comments: pt runs a Copywriter, 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 Transfers Overall transfer level: Needs assistance Equipment used: None Transfers: Sit to/from Stand Sit to Stand: Min assist General transfer comment: minA to power up and minA to slow descent Ambulation/Gait Ambulation/Gait assistance: Min assist Gait Distance (Feet): 250 Feet Assistive device: 1 person hand held assist Gait Pattern/deviations: Step-to pattern, Drifts right/left, Wide base of support General Gait Details: pt with shortened step to gait with significantly reduced gait speed. limited arm swing bilaterally, then later excessive arm swing when cued to relax UEs. Pt drifts laterally with multiple minor losses of balance Gait velocity: reduced Gait velocity interpretation: <1.8 ft/sec, indicate of risk for recurrent falls Stairs: Yes Stairs assistance: Min assist, Mod assist Stair Management: No rails Number of Stairs: 2 General stair comments: modA for one posterior LOB when descending steps     ADL: ADL Overall ADL's : Needs assistance/impaired Grooming: Minimal assistance, Sitting Upper Body Bathing: Minimal assistance, Sitting Lower Body Bathing: Moderate assistance, +2 for physical assistance, +2 for safety/equipment, Sit to/from stand Upper Body Dressing : Minimal assistance, Sitting Lower Body Dressing: Maximal assistance, +2 for physical assistance, +2 for safety/equipment, Sit to/from stand Toilet Transfer: Moderate assistance, +2 for physical assistance, +2 for safety/equipment, Ambulation Toilet Transfer Details (indicate cue type and reason): simulated to recliner  Functional mobility during ADLs: Moderate assistance, +2 for physical assistance, +2 for safety/equipment, Cueing for sequencing, Cueing for safety General ADL Comments: pt limited by impaired cognition, decreased coordination, impaired balance and generalized weakness   Cognition: Cognition Overall Cognitive Status: Impaired/Different from baseline Orientation Level: Oriented X4 Cognition Arousal/Alertness: Awake/alert Behavior During Therapy: Flat affect Overall Cognitive Status: Impaired/Different from baseline Area of Impairment: Attention, Memory, Following commands, Safety/judgement, Awareness, Problem solving Orientation Level: Disoriented to, Time (pt adamant it's friday 8/26, saying phone & calendar is wron) Current Attention Level: Sustained Memory: Decreased recall of precautions, Decreased short-term memory Following Commands: Follows one step commands with increased time Safety/Judgement: Decreased awareness of safety, Decreased awareness of deficits Awareness: Emergent Problem Solving: Difficulty sequencing, Requires verbal cues, Requires tactile cues General Comments: pt adamant about the date 8/26, Friday despite max directional verbal cues and re-direction.    Blood pressure (!) 143/83, pulse 100, temperature 98.6 F (37 C), temperature source Oral, resp. rate 18, height 6' (1.829  m), weight 98.7  kg, SpO2 95 %. Physical Exam Vitals and nursing note reviewed.  Constitutional:      Appearance: Normal appearance.  HENT:     Head: Normocephalic and atraumatic.     Right Ear: External ear normal.     Left Ear: External ear normal.     Nose: Nose normal.     Mouth/Throat:     Mouth: Mucous membranes are moist.  Eyes:     General: Scleral icterus present.  Cardiovascular:     Rate and Rhythm: Normal rate and regular rhythm.     Heart sounds: No murmur heard.  No gallop.   Pulmonary:     Effort: Pulmonary effort is normal. No respiratory distress.     Breath sounds: No wheezing or rhonchi.  Abdominal:     General: There is distension.     Tenderness: There is no abdominal tenderness.  Musculoskeletal:        General: No swelling or tenderness.     Cervical back: Normal range of motion.  Skin:    General: Skin is warm and dry.     Coloration: Skin is jaundiced.     Comments: Macular/papular rash on the back.  Neurological:     Mental Status: He is alert and oriented to person, place, and time.     Cranial Nerves: No cranial nerve deficit.     Sensory: No sensory deficit.     Comments: Reasonable insight and awareness. Speech clear. Normal language. Functional memory. UE 5/5 prox to distal. LE: 4-/5 HF, 4/5 KE and 5/5 ADF/PF. No sensory deficits.   Psychiatric:        Mood and Affect: Mood normal.        Behavior: Behavior normal.        Thought Content: Thought content normal.        Judgment: Judgment normal.     Results for orders placed or performed during the hospital encounter of 05/15/20 (from the past 48 hour(s))  CBC     Status: Abnormal   Collection Time: 05/25/20  3:28 AM  Result Value Ref Range   WBC 20.7 (H) 4.0 - 10.5 K/uL   RBC 3.05 (L) 4.22 - 5.81 MIL/uL   Hemoglobin 9.2 (L) 13.0 - 17.0 g/dL   HCT 28.7 (L) 39 - 52 %   MCV 94.1 80.0 - 100.0 fL   MCH 30.2 26.0 - 34.0 pg   MCHC 32.1 30.0 - 36.0 g/dL   RDW 18.7 (H) 11.5 - 15.5 %    Platelets 251 150 - 400 K/uL   nRBC 0.0 0.0 - 0.2 %    Comment: Performed at [facility], [address], [place], [place] [address]  CBC     Status: Abnormal   Collection Time: 05/26/20  2:09 AM  Result Value Ref Range   WBC 21.9 (H) 4.0 - 10.5 K/uL   RBC 3.12 (L) 4.22 - 5.81 MIL/uL   Hemoglobin 9.4 (L) 13.0 - 17.0 g/dL   HCT 29.3 (L) 39 - 52 %   MCV 93.9 80.0 - 100.0 fL   MCH 30.1 26.0 - 34.0 pg   MCHC 32.1 30.0 - 36.0 g/dL   RDW 19.0 (H) 11.5 - 15.5 %   Platelets 257 150 - 400 K/uL   nRBC 0.0 0.0 - 0.2 %    Comment: Performed at [facility], [place] [address], [place], [place] [address]  Comprehensive metabolic panel     Status: Abnormal   Collection Time:  05/26/20  2:09 AM  Result Value Ref Range   Sodium 140 135 - 145 mmol/L   Potassium 3.5 3.5 - 5.1 mmol/L   Chloride 102 98 - 111 mmol/L   CO2 25 22 - 32 mmol/L   Glucose, Bld 99 70 - 99 mg/dL    Comment: Glucose reference range applies only to samples taken after fasting for at least 8 hours.   BUN 8 6 - 20 mg/dL   Creatinine, Ser 0.70 0.61 - 1.24 mg/dL   Calcium 8.7 (L) 8.9 - 10.3 mg/dL   Total Protein 6.4 (L) 6.5 - 8.1 g/dL   Albumin 2.5 (L) 3.5 - 5.0 g/dL   AST 125 (H) 15 - 41 U/L   ALT 79 (H) 0 - 44 U/L   Alkaline Phosphatase 192 (H) 38 - 126 U/L   Total Bilirubin 4.0 (H) 0.3 - 1.2 mg/dL   GFR calc non Af Amer >60 >60 mL/min   GFR calc Af Amer >60 >60 mL/min   Anion gap 13 5 - 15    Comment: Performed at [facility] [address] [place], [place] [address]   No results found.     Medical Problem List and Plan: 1.  Functional and mobility deficits secondary to hemorrhagic shock d/t esophageal bleed/cirrhosis of liver s/p embolization, TIPS procedure  -patient may shower  -ELOS/Goals: 7-10 days, supervision to mod I 2.  Antithrombotics: -DVT/anticoagulation:  Mechanical: Sequential compression devices, below knee Bilateral lower extremities  -antiplatelet therapy: N/A 3. Pain Management:  Oxycodone prn?--may need to d/c if confusion continues.  4. Mood: LCSW to follow for evaluation and support.   -antipsychotic agents: N/A 5. Neuropsych: This patient Is not fully capable of making decisions on his own behalf but he's approaching baseline.  -cognition has improved immensely .  6. Rash//Skin/Wound Care: Monitor I/O. Routine pressure relief measures. Will add Sarna to help manage symptoms.  7. Fluids/Electrolytes/Nutrition: Monitor I/O. Check lytes in am.  8. ABLA/GIB s/p TIPS: Continue to monitor H/H with serial checks. Transfuse prn Hgb< 7.0.  9. Decompensated Cirrhosis/Hepatic encephalopathy: Ammonia level 132-->recheck in am.   -Continue PPI bid  - lactulose 20 gram bid  -Rifaximin. Has completed 10/10 days Rocephin on 08/24.    -To continue prednisone 40 mg daily X 28 days then d/c per GI.  10. Hypokalemia: Multifactorial. Resolved with runs of K+---will add Kdur 20 meq daily for supplement as starting to trend down again--recheck labs in am.  11. HTN: BP trending up-->Norvasc added 08/25. Clonidine resumed today.  12. Leucocytosis: Likely due to steroids -->monitor for signs of infection.        Bary Leriche, PA-C 05/26/2020

## 2020-05-23 NOTE — Progress Notes (Signed)
Pt  Is very confused and restless at this time, pt pulled his IV out and trying to pull the new one. He bites off his mittens. Notified MD on call. New orders received.

## 2020-05-24 ENCOUNTER — Inpatient Hospital Stay (HOSPITAL_COMMUNITY): Payer: BC Managed Care – PPO | Admitting: Occupational Therapy

## 2020-05-24 ENCOUNTER — Inpatient Hospital Stay (HOSPITAL_COMMUNITY): Payer: BC Managed Care – PPO

## 2020-05-24 DIAGNOSIS — G9341 Metabolic encephalopathy: Secondary | ICD-10-CM

## 2020-05-24 DIAGNOSIS — K56609 Unspecified intestinal obstruction, unspecified as to partial versus complete obstruction: Secondary | ICD-10-CM

## 2020-05-24 LAB — BASIC METABOLIC PANEL
Anion gap: 11 (ref 5–15)
BUN: 7 mg/dL (ref 6–20)
CO2: 24 mmol/L (ref 22–32)
Calcium: 8.8 mg/dL — ABNORMAL LOW (ref 8.9–10.3)
Chloride: 103 mmol/L (ref 98–111)
Creatinine, Ser: 0.68 mg/dL (ref 0.61–1.24)
GFR calc Af Amer: >60 mL/min (ref >60)
GFR calc non Af Amer: >60 mL/min (ref >60)
Glucose, Bld: 120 mg/dL — ABNORMAL HIGH (ref 70–99)
Potassium: 4.5 mmol/L (ref 3.5–5.1)
Sodium: 138 mmol/L (ref 135–145)

## 2020-05-24 LAB — CBC WITH DIFFERENTIAL/PLATELET
Abs Immature Granulocytes: 0.28 10*3/uL — ABNORMAL HIGH (ref 0.00–0.07)
Basophils Absolute: 0 10*3/uL (ref 0.0–0.1)
Basophils Relative: 0 %
Eosinophils Absolute: 0 10*3/uL (ref 0.0–0.5)
Eosinophils Relative: 0 %
HCT: 28.9 % — ABNORMAL LOW (ref 39.0–52.0)
Hemoglobin: 9.3 g/dL — ABNORMAL LOW (ref 13.0–17.0)
Immature Granulocytes: 1 %
Lymphocytes Relative: 7 %
Lymphs Abs: 1.4 10*3/uL (ref 0.7–4.0)
MCH: 29.6 pg (ref 26.0–34.0)
MCHC: 32.2 g/dL (ref 30.0–36.0)
MCV: 92 fL (ref 80.0–100.0)
Monocytes Absolute: 0.9 10*3/uL (ref 0.1–1.0)
Monocytes Relative: 4 %
Neutro Abs: 19.2 10*3/uL — ABNORMAL HIGH (ref 1.7–7.7)
Neutrophils Relative %: 88 %
Platelets: 262 10*3/uL (ref 150–400)
RBC: 3.14 MIL/uL — ABNORMAL LOW (ref 4.22–5.81)
RDW: 18.3 % — ABNORMAL HIGH (ref 11.5–15.5)
WBC: 21.8 10*3/uL — ABNORMAL HIGH (ref 4.0–10.5)
nRBC: 0 % (ref 0.0–0.2)

## 2020-05-24 LAB — COMPREHENSIVE METABOLIC PANEL
ALT: 73 U/L — ABNORMAL HIGH (ref 0–44)
AST: 126 U/L — ABNORMAL HIGH (ref 15–41)
Albumin: 2.7 g/dL — ABNORMAL LOW (ref 3.5–5.0)
Alkaline Phosphatase: 187 U/L — ABNORMAL HIGH (ref 38–126)
Anion gap: 12 (ref 5–15)
BUN: 6 mg/dL (ref 6–20)
CO2: 24 mmol/L (ref 22–32)
Calcium: 8.3 mg/dL — ABNORMAL LOW (ref 8.9–10.3)
Chloride: 101 mmol/L (ref 98–111)
Creatinine, Ser: 0.6 mg/dL — ABNORMAL LOW (ref 0.61–1.24)
GFR calc Af Amer: >60 mL/min (ref >60)
GFR calc non Af Amer: >60 mL/min (ref >60)
Glucose, Bld: 117 mg/dL — ABNORMAL HIGH (ref 70–99)
Potassium: 3 mmol/L — ABNORMAL LOW (ref 3.5–5.1)
Sodium: 137 mmol/L (ref 135–145)
Total Bilirubin: 4.5 mg/dL — ABNORMAL HIGH (ref 0.3–1.2)
Total Protein: 6.6 g/dL (ref 6.5–8.1)

## 2020-05-24 LAB — AMMONIA: Ammonia: 53 umol/L — ABNORMAL HIGH (ref 9–35)

## 2020-05-24 MED ORDER — HALOPERIDOL LACTATE 5 MG/ML IJ SOLN
5.0000 mg | Freq: Once | INTRAMUSCULAR | Status: AC
  Administered 2020-05-24: 5 mg via INTRAVENOUS
  Filled 2020-05-24: qty 1

## 2020-05-24 MED ORDER — POTASSIUM CHLORIDE 10 MEQ/100ML IV SOLN
10.0000 meq | INTRAVENOUS | Status: AC
  Administered 2020-05-24 (×4): 10 meq via INTRAVENOUS
  Filled 2020-05-24 (×4): qty 100

## 2020-05-24 MED ORDER — QUETIAPINE FUMARATE 50 MG PO TABS
25.0000 mg | ORAL_TABLET | Freq: Every day | ORAL | Status: DC
  Administered 2020-05-24: 25 mg via ORAL
  Filled 2020-05-24: qty 1

## 2020-05-24 NOTE — Progress Notes (Signed)
Pt's HR between 120's to 150's. Pt confused and restless. Notified MD on call. Rapid at bedside for further assessment. New orders received.

## 2020-05-24 NOTE — Progress Notes (Signed)
PROGRESS NOTE  Mali R Schiavo NUU:[id number] DOB: 1971/09/12 DOA: 05/15/2020 PCP: Patient, No Pcp Per  Brief History   This is a 49 year old male who has a past medical history significant for drinking 1/2 gallon of hard liquor a day who presented to [facility] with a chief complaint of hematemesis on August 18. Apparently he had developed abdominal pain and nausea and vomiting which was initially nonbilious nonbloody for 2 days prior to admission. He was on the way to see his physician on August 18 when he suddenly developed hematemesis. In the emergency department he was noted to have a large ventral hernia which was incarcerated based on CT scanning. This was easily reduced and he was admitted to the intensive care unit on an octreotide infusion. However, hematemesis and hematochezia persisted. He required intubation, central line placement, vasopressor infusion, multiple blood transfusions, and emergent endoscopy. Unfortunately endoscopy was incomplete as the patient had significant bleeding. 7 bands were placed but esophageal variceal bleeding was unable to be controlled. Reportedly Covid negative. He was [facility] to [facility] for emergent TIPS. 2 units PRBC given during the procedure. PCCM accepted the patient for ICU admission.   Significant Hospital Events   8/ 18 admitted [facility] 8/ 19 emergent endoscopy, banding of variceal lesions in esophagus, transferred to [facility] for emergent TIPS procedure 8/ 20 continued blood product administration 8/23 extubated but remains confused 8/24 improving confusion.  Procedures:  August 19 endotracheal tube August 19 right IJ central venous line August 19 arterial line   Significant Diagnostic Tests:  August 18 CT abdomen >high grade SBO with transition at small umbilical hernia containing a portion on the mid jejunum. Cirrhosis with portal venous hypertension and marked abdominal varices.    Micro Data:  August 19 SARS-CoV-2> negative Hepatitis A IgM ab neg Hepatitis Bs antigen negative Hepatitis B core IGM negative Hepatitis C antibody 0.1  On 05/23/2020 the patient went to IT for paracentesis. However he was found to have insufficient fluid for extraction. On the early morning hours the patient had an episode of confusion, agitation, and tachycardia. Relieved with haldol.  Consultants  . Gastroenterology . PCCM . Interventional radiology  Procedures  . TIPS  Antibiotics   Anti-infectives (From admission, onward)   Start     Dose/Rate Route Frequency Ordered Stop   05/20/20 1600  cefTRIAXone (ROCEPHIN) 2 g in sodium chloride 0.9 % 100 mL IVPB        2 g 200 mL/hr over 30 Minutes Intravenous Every 24 hours 05/20/20 0936 05/26/20 1559   05/19/20 2200  rifaximin (XIFAXAN) tablet 550 mg        550 mg Oral 2 times daily 05/19/20 2132     05/19/20 1600  cefTRIAXone (ROCEPHIN) 2 g in sodium chloride 0.9 % 100 mL IVPB  Status:  Discontinued        2 g 200 mL/hr over 30 Minutes Intravenous Every 24 hours 05/19/20 0858 05/20/20 0936   05/18/20 1600  cefTRIAXone (ROCEPHIN) 1 g in sodium chloride 0.9 % 100 mL IVPB  Status:  Discontinued        1 g 200 mL/hr over 30 Minutes Intravenous Every 24 hours 05/18/20 1506 05/19/20 0858   05/16/20 1500  rifaximin (XIFAXAN) tablet 550 mg  Status:  Discontinued        550 mg Per Tube 2 times daily 05/16/20 1446 05/19/20 2132   05/15/20 1930  piperacillin-tazobactam (ZOSYN) IVPB 3.375 g  Status:  Discontinued  3.375 g 12.5 mL/hr over 240 Minutes Intravenous Every 8 hours 05/15/20 1920 05/18/20 1506   05/15/20 1825  ceFAZolin (ANCEF) 2-4 GM/100ML-% IVPB       Note to Pharmacy: Tressie Stalker   : cabinet override      05/15/20 1825 05/16/20 0629     Subjective  The patient is resting comfortably. Wife is at bedside. She feels that he continues to be very confused, although he answers orientation questions correctly.  Objective    Vitals:  Vitals:   05/24/20 0448 05/24/20 1450  BP: (!) 144/93 (!) 143/87  Pulse: 93 77  Resp: 16 17  Temp: 98.5 F (36.9 C) 98.8 F (37.1 C)  SpO2: 98% 97%   Exam:  Constitutional:  . The patient is awake, alert, and oriented x 3.  No acute distress. Respiratory:  . No increased work of breathing. . No wheezes, rales, or rhonchi . No tactile fremitus Cardiovascular:  . Regular rate and rhythm . No murmurs, ectopy, or gallups. . No lateral PMI. No thrills. Abdomen:  . Abdomen is soft, non-tender, non-distended . No hernias, masses, or organomegaly . Normoactive bowel sounds.  Musculoskeletal:  . No cyanosis, clubbing, or edema Skin:  . No rashes, lesions, ulcers . palpation of skin: no induration or nodules Neurologic:  . CN 2-12 intact . Sensation all 4 extremities intact Psychiatric:  . Mental status o Mood, affect appropriate o Orientation to person, place, time today  I have personally reviewed the following:   Today's Data  . Vitals, BMP, CBC  Micro Data  . MRSA by PCR negative.  Imaging  . Abdominal x-ray  Scheduled Meds: . amLODipine  5 mg Oral Daily  . Chlorhexidine Gluconate Cloth  6 each Topical Daily  . feeding supplement (ENSURE ENLIVE)  237 mL Oral BID BM  . lactulose  20 g Oral BID  . levothyroxine  25 mcg Oral Q0600  . pantoprazole  40 mg Oral BID   Followed by  . [START ON 06/03/2020] pantoprazole  40 mg Oral Daily  . predniSONE  40 mg Oral Q breakfast   Followed by  . [START ON 06/11/2020] predniSONE  30 mg Oral Q breakfast   Followed by  . [START ON 06/15/2020] predniSONE  20 mg Oral Q breakfast   Followed by  . [START ON 06/19/2020] predniSONE  10 mg Oral Q breakfast   Followed by  . [START ON 06/22/2020] predniSONE  5 mg Oral Q breakfast  . rifaximin  550 mg Oral BID  . sodium chloride flush  10-40 mL Intracatheter Q12H  . thiamine  100 mg Oral Daily   Continuous Infusions: . sodium chloride Stopped (05/21/20 1746)  .  albumin human    . cefTRIAXone (ROCEPHIN)  IV 2 g (05/23/20 1627)    Active Problems:   Hemorrhagic shock (HCC)   Gastric bleed   Small bowel obstruction (HCC)   Acute respiratory failure with hypoxia (HCC)   Decompensated hepatic cirrhosis ([place])   Acute kidney injury ([place])   LOS: 9 days   A & P  Acute hypoxic respiratory failure requiring intubation: Now extubated saturating well over 90% and comfortable on room air.  Acute metabolic encephalopathy , multifactorial etiology including hepatic encephalopathy: Ongoing. EtOH abuse disorder, now out of withdrawal window.  Although he had an episode of agitation, confusion, and tachycardia this morning, this morning he is oriented x 3 for me. The wife is at bedside, and endorses continued confusion. Cont lactulose at lower dose  given diarrhea. Continue thiamine. As pt seems a little more clear this morning after I will give a low dose of seroquel this evening.   ABLA in setting of variceal bleeding and improved hemorrhagic shock. s/p emergent TIPS 8/19, blood product resuscitation. HB now stable and no signs of bleeding.  Hemoglobin 9.6 today GI recommends holding off to beta-blocker now that patient is status post TIPS. Continue Protonix.  Has received 7 days of prophylaxis with Rocephin.  Pharmacy talked to the GI and they want 10 days of prophylaxis.  Decompensated alcoholic cirrhosis with portal hypertension, MELD 35 S/p TIPS 8/29. Now encephalopathic. Continued coagulopathy, hyperbilirubinemia with acute alcoholic hepatitis.  Viral hepatitis ruled out.  He remains on tapering dose of steroids per GI.  Leukocytosis: Likely due to being on steroids.  No signs of infection.  Monitor.  Essential hypertension: Previously he was on clonidine.  Has been off of this and blood pressure was fairly controlled.  Now it is rising.  We will keep him off of clonidine now and start him on amlodipine 5 mg p.o. daily.  I have seen and examined this  patient myself. I have spent 32 minutes in his evaluation and care.  DVT prophylaxis: SCDs Start: 05/15/20 1817 Code Status: Full Code  Family Communication: Wife present at bedside.  Plan of care discussed with her. Disposition:  Status is: Inpatient  Remains inpatient appropriate because:Inpatient level of care appropriate due to severity of illness.  Dispo: The patient is from: Home  Anticipated d/c is to: CIR  Anticipated d/c date is: 2 days  Patient currently is not medically stable to d/c. Kolleen Ochsner, DO [facility] Direct contact: see [email or web address]  7PM-7AM contact night coverage as above 05/24/2020, 4:24 PM  LOS: 8 days

## 2020-05-24 NOTE — Progress Notes (Addendum)
Subjective: Patient seen and examined at bedside in presence of his wife. Drinking fluids and appears comfortable.  Objective: Vital signs in last 24 hours: Temp:  [98.3 F (36.8 C)-98.5 F (36.9 C)] 98.5 F (36.9 C) (08/28 0448) Pulse Rate:  [89-110] 93 (08/28 0448) Resp:  [16-18] 16 (08/28 0448) BP: (144-167)/(93-94) 144/93 (08/28 0448) SpO2:  [96 %-100 %] 98 % (08/28 0448) Weight:  [104.5 kg] 104.5 kg (08/28 0500) Weight change:  Last BM Date: 05/22/20  PE: Mild pallor, mild icterus GENERAL: Alert, awake, oriented to place, person and time  ABDOMEN: Distended but soft, small umbilical hernia, normoactive bowel sounds EXTREMITIES: Minimal bipedal edema  Lab Results: Results for orders placed or performed during the hospital encounter of 05/15/20 (from the past 48 hour(s))  CBC     Status: Abnormal   Collection Time: 05/23/20  1:49 AM  Result Value Ref Range   WBC 21.4 (H) 4.0 - 10.5 K/uL   RBC 3.17 (L) 4.22 - 5.81 MIL/uL   Hemoglobin 9.4 (L) 13.0 - 17.0 g/dL   HCT 29.4 (L) 39 - 52 %   MCV 92.7 80.0 - 100.0 fL   MCH 29.7 26.0 - 34.0 pg   MCHC 32.0 30.0 - 36.0 g/dL   RDW 17.5 (H) 11.5 - 15.5 %   Platelets 262 150 - 400 K/uL   nRBC 0.1 0.0 - 0.2 %    Comment: Performed at [facility], [address], [place], [place] [address]  Basic metabolic panel     Status: Abnormal   Collection Time: 05/23/20  1:49 AM  Result Value Ref Range   Sodium 140 135 - 145 mmol/L   Potassium 3.0 (L) 3.5 - 5.1 mmol/L   Chloride 104 98 - 111 mmol/L   CO2 26 22 - 32 mmol/L   Glucose, Bld 123 (H) 70 - 99 mg/dL    Comment: Glucose reference range applies only to samples taken after fasting for at least 8 hours.   BUN 7 6 - 20 mg/dL   Creatinine, Ser 0.71 0.61 - 1.24 mg/dL   Calcium 8.2 (L) 8.9 - 10.3 mg/dL   GFR calc non Af Amer >60 >60 mL/min   GFR calc Af Amer >60 >60 mL/min   Anion gap 10 5 - 15    Comment: Performed at [facility] [address], [place], [place]  [address]  Magnesium     Status: None   Collection Time: 05/23/20  1:49 AM  Result Value Ref Range   Magnesium 1.8 1.7 - 2.4 mg/dL    Comment: Performed at [facility] [address] [place], [place] [address]  Comprehensive metabolic panel     Status: Abnormal   Collection Time: 05/24/20  1:02 AM  Result Value Ref Range   Sodium 137 135 - 145 mmol/L   Potassium 3.0 (L) 3.5 - 5.1 mmol/L   Chloride 101 98 - 111 mmol/L   CO2 24 22 - 32 mmol/L   Glucose, Bld 117 (H) 70 - 99 mg/dL    Comment: Glucose reference range applies only to samples taken after fasting for at least 8 hours.   BUN 6 6 - 20 mg/dL   Creatinine, Ser 0.60 (L) 0.61 - 1.24 mg/dL   Calcium 8.3 (L) 8.9 - 10.3 mg/dL   Total Protein 6.6 6.5 - 8.1 g/dL   Albumin 2.7 (L) 3.5 - 5.0 g/dL   AST 126 (H) 15 - 41 U/L   ALT 73 (  H) 0 - 44 U/L   Alkaline Phosphatase 187 (H) 38 - 126 U/L   Total Bilirubin 4.5 (H) 0.3 - 1.2 mg/dL   GFR calc non Af Amer >60 >60 mL/min   GFR calc Af Amer >60 >60 mL/min   Anion gap 12 5 - 15    Comment: Performed at [facility] [address], [place], [place] [address]  CBC with Differential/Platelet     Status: Abnormal   Collection Time: 05/24/20  1:02 AM  Result Value Ref Range   WBC 21.8 (H) 4.0 - 10.5 K/uL   RBC 3.14 (L) 4.22 - 5.81 MIL/uL   Hemoglobin 9.3 (L) 13.0 - 17.0 g/dL   HCT 28.9 (L) 39 - 52 %   MCV 92.0 80.0 - 100.0 fL   MCH 29.6 26.0 - 34.0 pg   MCHC 32.2 30.0 - 36.0 g/dL   RDW 18.3 (H) 11.5 - 15.5 %   Platelets 262 150 - 400 K/uL   nRBC 0.0 0.0 - 0.2 %   Neutrophils Relative % 88 %   Neutro Abs 19.2 (H) 1.7 - 7.7 K/uL   Lymphocytes Relative 7 %   Lymphs Abs 1.4 0.7 - 4.0 K/uL   Monocytes Relative 4 %   Monocytes Absolute 0.9 0 - 1 K/uL   Eosinophils Relative 0 %   Eosinophils Absolute 0.0 0 - 0 K/uL   Basophils Relative 0 %   Basophils Absolute 0.0 0 - 0 K/uL   Immature Granulocytes 1 %   Abs Immature Granulocytes 0.28 (H) 0.00 - 0.07 K/uL    Comment:  Performed at [facility] [address] [place], [place] [address]  Ammonia     Status: Abnormal   Collection Time: 05/24/20  1:02 AM  Result Value Ref Range   Ammonia 53 (H) 9 - 35 umol/L    Comment: Performed at [facility], [place] [address], [place], [place] [address]    Studies/Results: DG Abd 2 Views  Result Date: 05/22/2020 CLINICAL DATA:  49 year old male with abdominal distension. Small-bowel obstruction on CT 05/04/2020. EXAM: ABDOMEN - 2 VIEW COMPARISON:  Radiographs 05/17/2020 and earlier. FINDINGS: Upright and supine views. No free air under the diaphragm. Enteric tube has been removed. Upper abdominal embolization coils and sequelae of TIPS again noted. Improved bowel gas pattern, with only mildly dilated gas-filled distal small bowel and large bowel now. Negative visible lung bases. No acute osseous abnormality identified. Bilateral hip arthroplasty. IMPRESSION: 1. Improved bowel gas pattern since 05/17/2020. Possible mild residual ileus now. No free air. 2. Enteric tube removed. Sequelae of TIPS and upper abdominal coil embolization. Electronically Signed   By: Genevie Ann M.D.   On: 05/22/2020 13:31   IR ABDOMEN US LIMITED  Result Date: 05/24/2020 CLINICAL DATA:  49 year old with cirrhosis and recent TIPS procedure for variceal bleeding. Evaluate for ascites and paracentesis. EXAM: LIMITED ABDOMEN ULTRASOUND FOR ASCITES TECHNIQUE: Limited ultrasound survey for ascites was performed in all four abdominal quadrants. COMPARISON:  Abdominal CT 05/14/2020 FINDINGS: No significant ascites identified in the abdomen. IMPRESSION: No ascites. Electronically Signed   By: Markus Daft M.D.   On: 05/24/2020 11:07    Medications: I have reviewed the patient's current medications.  Assessment: Status post TIPS for uncontrolled esophageal varices Hemoglobin stable at 9.3 Ultrasound showed no significant/drainable ascites Renal function normal, BUN 6, creatinine 0.6/GFR more than  60 Significant leukocytosis-likely related to use of steroids  Plan: Improving progressively, on 2 g sodium  diet Continue IV ceftriaxone  Continue lactulose 20 g twice a day, along with Xifaxan 550 mg twice daily  Ronnette Juniper, MD 05/24/2020, 12:49 PM

## 2020-05-24 NOTE — Significant Event (Signed)
Rapid Response Event Note   Reason for Call :  Called d/t tachycardia(120s-140s). Per RN, pt has AMS. This has gotten worse over the last 24 hours.   Initial Focused Assessment:  Pt laying in bed with eyes open. Pt is agitated. He is having visual hallucinations, trying to get up, and trying to remove lines. Pt oriented to self only. Pupils 6 and brisk. Lungs CTA. Skin warm and dry. Pt is in 4-pt restraints plus B safety mitts. Despite this, he is still trying to get up. T-98.3, HR-110, BP-167/94, RR-16, SpO2-100% on RA.    Interventions:  EKG-ST Ammonia Haldol 5mg  IV x 1 Delirium precautions Plan of Care:  Pt less agitated after Haldol administration. HR now 90-110. Pt is outside of withdrawal window. ??Delirium?? Pt also on lactulose. Last ammonia level was 8/21. Check ammonia level and relay results to MD. Delirium precautions. Continue to monitor pt. Call RRT if further assistance needed.    Event Summary:   MD Notified: Dr. Myna Hidalgo notified by bedside RN  Call [place], Evanne Matsunaga Anderson, RN

## 2020-05-25 LAB — CBC
HCT: 28.7 % — ABNORMAL LOW (ref 39.0–52.0)
Hemoglobin: 9.2 g/dL — ABNORMAL LOW (ref 13.0–17.0)
MCH: 30.2 pg (ref 26.0–34.0)
MCHC: 32.1 g/dL (ref 30.0–36.0)
MCV: 94.1 fL (ref 80.0–100.0)
Platelets: 251 10*3/uL (ref 150–400)
RBC: 3.05 MIL/uL — ABNORMAL LOW (ref 4.22–5.81)
RDW: 18.7 % — ABNORMAL HIGH (ref 11.5–15.5)
WBC: 20.7 10*3/uL — ABNORMAL HIGH (ref 4.0–10.5)
nRBC: 0 % (ref 0.0–0.2)

## 2020-05-25 MED ORDER — PREDNISOLONE 5 MG PO TABS
40.0000 mg | ORAL_TABLET | Freq: Every day | ORAL | Status: DC
  Administered 2020-05-26: 40 mg via ORAL
  Filled 2020-05-25: qty 8

## 2020-05-25 NOTE — Progress Notes (Signed)
PROGRESS NOTE  Mali R Haydu JME:[id number] DOB: 1970-10-12 DOA: 05/15/2020 PCP: Patient, No Pcp Per  Brief History   This is a 49 year old male who has a past medical history significant for drinking 1/2 gallon of hard liquor a day who presented to [facility] with a chief complaint of hematemesis on August 18. Apparently he had developed abdominal pain and nausea and vomiting which was initially nonbilious nonbloody for 2 days prior to admission. He was on the way to see his physician on August 18 when he suddenly developed hematemesis. In the emergency department he was noted to have a large ventral hernia which was incarcerated based on CT scanning. This was easily reduced and he was admitted to the intensive care unit on an octreotide infusion. However, hematemesis and hematochezia persisted. He required intubation, central line placement, vasopressor infusion, multiple blood transfusions, and emergent endoscopy. Unfortunately endoscopy was incomplete as the patient had significant bleeding. 7 bands were placed but esophageal variceal bleeding was unable to be controlled. Reportedly Covid negative. He was transferred to [facility] for emergent TIPS. 2 units PRBC given during the procedure. PCCM accepted the patient for ICU admission.   Significant Hospital Events   8/ 18 admitted [facility] 8/ 19 emergent endoscopy, banding of variceal lesions in esophagus, transferred to [facility] for emergent TIPS procedure 8/ 20 continued blood product administration 8/23 extubated but remains confused 8/24 improving confusion.  Procedures:  August 19 endotracheal tube August 19 right IJ central venous line August 19 arterial line   Significant Diagnostic Tests:  August 18 CT abdomen >high grade SBO with transition at small umbilical hernia containing a portion on the mid jejunum. Cirrhosis with portal venous hypertension and marked abdominal varices.    Micro Data:  August 19 SARS-CoV-2> negative Hepatitis A IgM ab neg Hepatitis Bs antigen negative Hepatitis B core IGM negative Hepatitis C antibody 0.1  On 05/23/2020 the patient went to IT for paracentesis. However he was found to have insufficient fluid for extraction. On the early morning hours the patient had an episode of confusion, agitation, and tachycardia. Relieved with haldol. The patient was started on low dose seroquel on 05/24/2020. He was much better on the morning of 05/25/2020, but his wife was concerned, because the patient had an episode of incontinence. It has been stopped. Consultants  . Gastroenterology . PCCM . Interventional radiology  Procedures  . TIPS  Antibiotics   Anti-infectives (From admission, onward)   Start     Dose/Rate Route Frequency Ordered Stop   05/20/20 1600  cefTRIAXone (ROCEPHIN) 2 g in sodium chloride 0.9 % 100 mL IVPB        2 g 200 mL/hr over 30 Minutes Intravenous Every 24 hours 05/20/20 0936 05/25/20 1724   05/19/20 2200  rifaximin (XIFAXAN) tablet 550 mg        550 mg Oral 2 times daily 05/19/20 2132     05/19/20 1600  cefTRIAXone (ROCEPHIN) 2 g in sodium chloride 0.9 % 100 mL IVPB  Status:  Discontinued        2 g 200 mL/hr over 30 Minutes Intravenous Every 24 hours 05/19/20 0858 05/20/20 0936   05/18/20 1600  cefTRIAXone (ROCEPHIN) 1 g in sodium chloride 0.9 % 100 mL IVPB  Status:  Discontinued        1 g 200 mL/hr over 30 Minutes Intravenous Every 24 hours 05/18/20 1506 05/19/20 0858   05/16/20 1500  rifaximin (XIFAXAN) tablet 550 mg  Status:  Discontinued  550 mg Per Tube 2 times daily 05/16/20 1446 05/19/20 2132   05/15/20 1930  piperacillin-tazobactam (ZOSYN) IVPB 3.375 g  Status:  Discontinued        3.375 g 12.5 mL/hr over 240 Minutes Intravenous Every 8 hours 05/15/20 1920 05/18/20 1506   05/15/20 1825  ceFAZolin (ANCEF) 2-4 GM/100ML-% IVPB       Note to Pharmacy: Tressie Stalker   : cabinet override      05/15/20  1825 05/16/20 0629     Subjective  The patient is resting comfortably. Wife is at bedside. No new complaints other than complaints related to seroquel.  Objective   Vitals:  Vitals:   05/25/20 0535 05/25/20 1359  BP: 137/90 139/87  Pulse: 95 (!) 102  Resp: 18 18  Temp: 98.5 F (36.9 C) 98.4 F (36.9 C)  SpO2: 97% 96%   Exam:  Constitutional:  . The patient is awake, alert, and oriented x 3.  No acute distress. Respiratory:  . No increased work of breathing. . No wheezes, rales, or rhonchi . No tactile fremitus Cardiovascular:  . Regular rate and rhythm . No murmurs, ectopy, or gallups. . No lateral PMI. No thrills. Abdomen:  . Abdomen is soft, non-tender, non-distended . No hernias, masses, or organomegaly . Normoactive bowel sounds.  Musculoskeletal:  . No cyanosis, clubbing, or edema Skin:  . No rashes, lesions, ulcers . palpation of skin: no induration or nodules Neurologic:  . CN 2-12 intact . Sensation all 4 extremities intact Psychiatric:  . Mental status o Mood, affect appropriate o Orientation to person, place, time today  I have personally reviewed the following:   Today's Data  . Vitals, BMP, CBC  Micro Data  . MRSA by PCR negative.  Imaging  . Abdominal x-ray  Scheduled Meds: . amLODipine  5 mg Oral Daily  . Chlorhexidine Gluconate Cloth  6 each Topical Daily  . feeding supplement (ENSURE ENLIVE)  237 mL Oral BID BM  . lactulose  20 g Oral BID  . levothyroxine  25 mcg Oral Q0600  . pantoprazole  40 mg Oral BID   Followed by  . [START ON 06/03/2020] pantoprazole  40 mg Oral Daily  . [START ON 05/26/2020] prednisoLONE  40 mg Oral Daily  . rifaximin  550 mg Oral BID  . sodium chloride flush  10-40 mL Intracatheter Q12H  . thiamine  100 mg Oral Daily   Continuous Infusions: . sodium chloride Stopped (05/21/20 1746)    Active Problems:   Hemorrhagic shock (HCC)   Gastric bleed   Small bowel obstruction (HCC)   Acute respiratory  failure with hypoxia (HCC)   Decompensated hepatic cirrhosis ([place])   Acute kidney injury ([place])   LOS: 10 days   A & P  Acute hypoxic respiratory failure requiring intubation: Now extubated saturating well over 90% and comfortable on room air.  Acute metabolic encephalopathy , multifactorial etiology including hepatic encephalopathy: Ongoing. EtOH abuse disorder, now out of withdrawal window.  Although he had an episode of agitation, confusion, and tachycardia this morning, this morning he is oriented x 3 for me. The wife is at bedside, and endorses continued confusion. Cont lactulose at lower dose given diarrhea. Continue thiamine. As pt seemed a little more clear the following morning, I tried a low dose of seroquel to improve the patient's sleep, prevent further episodes of agitation/confusion, and hopefully allow him to be more clear during the day. The patient's wife, however, was distressed that the patient had an  episode of incontinence this morning. The seroquel will be stopped at her request.    ABLA in setting of variceal bleeding and improved hemorrhagic shock. s/p emergent TIPS 8/19, blood product resuscitation. HB now stable and no signs of bleeding.  Hemoglobin 9.6 today GI recommends holding off to beta-blocker now that patient is status post TIPS. Continue Protonix.  Has received 7 days of prophylaxis with Rocephin.  Pharmacy talked to the GI and they want 10 days of prophylaxis.  Decompensated alcoholic cirrhosis with portal hypertension, MELD 35 S/p TIPS 8/29. Now encephalopathic. Continued coagulopathy, hyperbilirubinemia with acute alcoholic hepatitis.  Viral hepatitis ruled out.  He remains on tapering dose of steroids per GI.  Leukocytosis: Likely due to being on steroids.  No signs of infection.  Monitor.  Essential hypertension: Previously he was on clonidine.  Has been off of this and blood pressure was fairly controlled.  Now it is rising.  We will keep him off of  clonidine now and start him on amlodipine 5 mg p.o. daily.  I have seen and examined this patient myself. I have spent 34 minutes in his evaluation and care.  DVT prophylaxis: SCDs Start: 05/15/20 1817 Code Status: Full Code  Family Communication: Wife present at bedside.  Plan of care discussed with her. Disposition:  Status is: Inpatient  Remains inpatient appropriate because:Inpatient level of care appropriate due to severity of illness.  Dispo: The patient is from: Home  Anticipated d/c is to: CIR  Anticipated d/c date is: 2 days  Patient currently is not medically stable to d/c. Kinzlie Harney, DO [facility] Direct contact: see [email or web address]  7PM-7AM contact night coverage as above 05/25/2020, 6:54 PM  LOS: 8 days

## 2020-05-25 NOTE — Progress Notes (Signed)
Subjective: Patient sitting comfortably on bed, his wife at bedside. He reports likely being discharged to rehab tomorrow. Overall states he feels well.  Objective: Vital signs in last 24 hours: Temp:  [98.5 F (36.9 C)-98.8 F (37.1 C)] 98.5 F (36.9 C) (08/29 0535) Pulse Rate:  [74-95] 95 (08/29 0535) Resp:  [17-18] 18 (08/29 0535) BP: (137-143)/(87-90) 137/90 (08/29 0535) SpO2:  [96 %-97 %] 97 % (08/29 0535) Weight:  [98.6 kg] 98.6 kg (08/29 0535) Weight change: -5.9 kg Last BM Date: 05/22/20  PE:Icteric, mild pallor GENERAL: Alert, oriented to time, place and person, no asterixis ABDOMEN: Soft, no shifting dullness, small umbilical hernia, normoactive bowel sounds EXTREMITIES: No edema, no deformity  Lab Results: Results for orders placed or performed during the hospital encounter of 05/15/20 (from the past 48 hour(s))  Comprehensive metabolic panel     Status: Abnormal   Collection Time: 05/24/20  1:02 AM  Result Value Ref Range   Sodium 137 135 - 145 mmol/L   Potassium 3.0 (L) 3.5 - 5.1 mmol/L   Chloride 101 98 - 111 mmol/L   CO2 24 22 - 32 mmol/L   Glucose, Bld 117 (H) 70 - 99 mg/dL    Comment: Glucose reference range applies only to samples taken after fasting for at least 8 hours.   BUN 6 6 - 20 mg/dL   Creatinine, Ser 0.60 (L) 0.61 - 1.24 mg/dL   Calcium 8.3 (L) 8.9 - 10.3 mg/dL   Total Protein 6.6 6.5 - 8.1 g/dL   Albumin 2.7 (L) 3.5 - 5.0 g/dL   AST 126 (H) 15 - 41 U/L   ALT 73 (H) 0 - 44 U/L   Alkaline Phosphatase 187 (H) 38 - 126 U/L   Total Bilirubin 4.5 (H) 0.3 - 1.2 mg/dL   GFR calc non Af Amer >60 >60 mL/min   GFR calc Af Amer >60 >60 mL/min   Anion gap 12 5 - 15    Comment: Performed at [facility] [address] [place], [place] [address]  CBC with Differential/Platelet     Status: Abnormal   Collection Time: 05/24/20  1:02 AM  Result Value Ref Range   WBC 21.8 (H) 4.0 - 10.5 K/uL   RBC 3.14 (L) 4.22 - 5.81 MIL/uL   Hemoglobin 9.3 (L)  13.0 - 17.0 g/dL   HCT 28.9 (L) 39 - 52 %   MCV 92.0 80.0 - 100.0 fL   MCH 29.6 26.0 - 34.0 pg   MCHC 32.2 30.0 - 36.0 g/dL   RDW 18.3 (H) 11.5 - 15.5 %   Platelets 262 150 - 400 K/uL   nRBC 0.0 0.0 - 0.2 %   Neutrophils Relative % 88 %   Neutro Abs 19.2 (H) 1.7 - 7.7 K/uL   Lymphocytes Relative 7 %   Lymphs Abs 1.4 0.7 - 4.0 K/uL   Monocytes Relative 4 %   Monocytes Absolute 0.9 0 - 1 K/uL   Eosinophils Relative 0 %   Eosinophils Absolute 0.0 0 - 0 K/uL   Basophils Relative 0 %   Basophils Absolute 0.0 0 - 0 K/uL   Immature Granulocytes 1 %   Abs Immature Granulocytes 0.28 (H) 0.00 - 0.07 K/uL    Comment: Performed at [facility] [address] [place],  [address]  Ammonia     Status: Abnormal   Collection Time: 05/24/20  1:02 AM  Result Value Ref Range   Ammonia 53 (H) 9 - 35  umol/L    Comment: Performed at [facility], [place] [address] [place], [place] [address]  Basic metabolic panel     Status: Abnormal   Collection Time: 05/24/20  2:24 PM  Result Value Ref Range   Sodium 138 135 - 145 mmol/L   Potassium 4.5 3.5 - 5.1 mmol/L    Comment: SLIGHT HEMOLYSIS   Chloride 103 98 - 111 mmol/L   CO2 24 22 - 32 mmol/L   Glucose, Bld 120 (H) 70 - 99 mg/dL    Comment: Glucose reference range applies only to samples taken after fasting for at least 8 hours.   BUN 7 6 - 20 mg/dL   Creatinine, Ser 0.68 0.61 - 1.24 mg/dL   Calcium 8.8 (L) 8.9 - 10.3 mg/dL   GFR calc non Af Amer >60 >60 mL/min   GFR calc Af Amer >60 >60 mL/min   Anion gap 11 5 - 15    Comment: Performed at [facility] [address] [place], [place] [address]  CBC     Status: Abnormal   Collection Time: 05/25/20  3:28 AM  Result Value Ref Range   WBC 20.7 (H) 4.0 - 10.5 K/uL   RBC 3.05 (L) 4.22 - 5.81 MIL/uL   Hemoglobin 9.2 (L) 13.0 - 17.0 g/dL   HCT 28.7 (L) 39 - 52 %   MCV 94.1 80.0 - 100.0 fL   MCH 30.2 26.0 - 34.0 pg   MCHC 32.1 30.0 - 36.0 g/dL   RDW 18.7 (H) 11.5 - 15.5 %    Platelets 251 150 - 400 K/uL   nRBC 0.0 0.0 - 0.2 %    Comment: Performed at [facility], [place] [address], [place], [place] [address]    Studies/Results: IR ABDOMEN US LIMITED  Result Date: 05/24/2020 CLINICAL DATA:  49 year old with cirrhosis and recent TIPS procedure for variceal bleeding. Evaluate for ascites and paracentesis. EXAM: LIMITED ABDOMEN ULTRASOUND FOR ASCITES TECHNIQUE: Limited ultrasound survey for ascites was performed in all four abdominal quadrants. COMPARISON:  Abdominal CT 05/14/2020 FINDINGS: No significant ascites identified in the abdomen. IMPRESSION: No ascites. Electronically Signed   By: Markus Daft M.D.   On: 05/24/2020 11:07    Medications: I have reviewed the patient's current medications.  Assessment: Decompensated liver cirrhosis likely related to alcohol abuse Variceal bleeding, status post TIPS, hemoglobin stable at 9.2 Alcoholic hepatitis-on prednisolone  Plan: Continue lactulose 20 g twice a day and Xifaxan 550 mg twice daily Prednisolone to be taken as 40 mg daily for a total of 28 days, then discontinue Pantoprazole twice a day until the 24th, thereafter once a day. I had a long discussion with the patient and his wife regarding importance of staying abstinent from alcohol use. Patient will need follow-up as an outpatient, for EGD, management of cirrhosis. Okay to discontinue IV ceftriaxone on discharge. At this point GI will sign off. Please recall if needed.  Ronnette Juniper, MD 05/25/2020, 11:54 AM

## 2020-05-26 ENCOUNTER — Encounter (HOSPITAL_COMMUNITY): Payer: Self-pay | Admitting: Pulmonary Disease

## 2020-05-26 ENCOUNTER — Inpatient Hospital Stay (HOSPITAL_COMMUNITY)
Admission: RE | Admit: 2020-05-26 | Discharge: 2020-05-29 | DRG: 442 | Disposition: A | Discharge status: Home / Self Care | Payer: BC Managed Care – PPO | Source: Intra-hospital | Attending: Physical Medicine & Rehabilitation | Admitting: Physical Medicine & Rehabilitation

## 2020-05-26 ENCOUNTER — Encounter (HOSPITAL_COMMUNITY): Payer: Self-pay | Admitting: Physical Medicine & Rehabilitation

## 2020-05-26 ENCOUNTER — Inpatient Hospital Stay: Payer: BC Managed Care – PPO

## 2020-05-26 ENCOUNTER — Other Ambulatory Visit: Payer: Self-pay

## 2020-05-26 DIAGNOSIS — D62 Acute posthemorrhagic anemia: Secondary | ICD-10-CM | POA: Diagnosis not present

## 2020-05-26 DIAGNOSIS — R4189 Other symptoms and signs involving cognitive functions and awareness: Secondary | ICD-10-CM | POA: Diagnosis not present

## 2020-05-26 DIAGNOSIS — Z79899 Other long term (current) drug therapy: Secondary | ICD-10-CM | POA: Diagnosis not present

## 2020-05-26 DIAGNOSIS — K7201 Acute and subacute hepatic failure with coma: Secondary | ICD-10-CM | POA: Diagnosis not present

## 2020-05-26 DIAGNOSIS — K7031 Alcoholic cirrhosis of liver with ascites: Secondary | ICD-10-CM

## 2020-05-26 DIAGNOSIS — D72829 Elevated white blood cell count, unspecified: Secondary | ICD-10-CM | POA: Diagnosis not present

## 2020-05-26 DIAGNOSIS — Z9181 History of falling: Secondary | ICD-10-CM | POA: Diagnosis not present

## 2020-05-26 DIAGNOSIS — K7460 Unspecified cirrhosis of liver: Secondary | ICD-10-CM | POA: Diagnosis present

## 2020-05-26 DIAGNOSIS — Z23 Encounter for immunization: Secondary | ICD-10-CM | POA: Diagnosis not present

## 2020-05-26 DIAGNOSIS — R5381 Other malaise: Secondary | ICD-10-CM | POA: Diagnosis not present

## 2020-05-26 DIAGNOSIS — Z79890 Hormone replacement therapy (postmenopausal): Secondary | ICD-10-CM

## 2020-05-26 DIAGNOSIS — F1010 Alcohol abuse, uncomplicated: Secondary | ICD-10-CM | POA: Diagnosis not present

## 2020-05-26 DIAGNOSIS — K7290 Hepatic failure, unspecified without coma: Secondary | ICD-10-CM | POA: Diagnosis not present

## 2020-05-26 DIAGNOSIS — E039 Hypothyroidism, unspecified: Secondary | ICD-10-CM | POA: Diagnosis not present

## 2020-05-26 DIAGNOSIS — I8510 Secondary esophageal varices without bleeding: Secondary | ICD-10-CM | POA: Diagnosis present

## 2020-05-26 DIAGNOSIS — I10 Essential (primary) hypertension: Secondary | ICD-10-CM | POA: Diagnosis present

## 2020-05-26 DIAGNOSIS — T380X5A Adverse effect of glucocorticoids and synthetic analogues, initial encounter: Secondary | ICD-10-CM | POA: Diagnosis present

## 2020-05-26 DIAGNOSIS — Z720 Tobacco use: Secondary | ICD-10-CM

## 2020-05-26 DIAGNOSIS — R21 Rash and other nonspecific skin eruption: Secondary | ICD-10-CM | POA: Diagnosis present

## 2020-05-26 DIAGNOSIS — K922 Gastrointestinal hemorrhage, unspecified: Secondary | ICD-10-CM | POA: Diagnosis present

## 2020-05-26 DIAGNOSIS — K7682 Hepatic encephalopathy: Secondary | ICD-10-CM | POA: Diagnosis present

## 2020-05-26 DIAGNOSIS — E876 Hypokalemia: Secondary | ICD-10-CM | POA: Diagnosis not present

## 2020-05-26 LAB — BASIC METABOLIC PANEL
Anion gap: 10 (ref 5–15)
BUN: 6 mg/dL (ref 6–20)
CO2: 29 mmol/L (ref 22–32)
Calcium: 9 mg/dL (ref 8.9–10.3)
Chloride: 99 mmol/L (ref 98–111)
Creatinine, Ser: 0.7 mg/dL (ref 0.61–1.24)
GFR calc Af Amer: >60 mL/min (ref >60)
GFR calc non Af Amer: >60 mL/min (ref >60)
Glucose, Bld: 138 mg/dL — ABNORMAL HIGH (ref 70–99)
Potassium: 3.8 mmol/L (ref 3.5–5.1)
Sodium: 138 mmol/L (ref 135–145)

## 2020-05-26 LAB — COMPREHENSIVE METABOLIC PANEL
ALT: 79 U/L — ABNORMAL HIGH (ref 0–44)
AST: 125 U/L — ABNORMAL HIGH (ref 15–41)
Albumin: 2.5 g/dL — ABNORMAL LOW (ref 3.5–5.0)
Alkaline Phosphatase: 192 U/L — ABNORMAL HIGH (ref 38–126)
Anion gap: 13 (ref 5–15)
BUN: 8 mg/dL (ref 6–20)
CO2: 25 mmol/L (ref 22–32)
Calcium: 8.7 mg/dL — ABNORMAL LOW (ref 8.9–10.3)
Chloride: 102 mmol/L (ref 98–111)
Creatinine, Ser: 0.7 mg/dL (ref 0.61–1.24)
GFR calc Af Amer: >60 mL/min (ref >60)
GFR calc non Af Amer: >60 mL/min (ref >60)
Glucose, Bld: 99 mg/dL (ref 70–99)
Potassium: 3.5 mmol/L (ref 3.5–5.1)
Sodium: 140 mmol/L (ref 135–145)
Total Bilirubin: 4 mg/dL — ABNORMAL HIGH (ref 0.3–1.2)
Total Protein: 6.4 g/dL — ABNORMAL LOW (ref 6.5–8.1)

## 2020-05-26 LAB — CBC
HCT: 29.3 % — ABNORMAL LOW (ref 39.0–52.0)
Hemoglobin: 9.4 g/dL — ABNORMAL LOW (ref 13.0–17.0)
MCH: 30.1 pg (ref 26.0–34.0)
MCHC: 32.1 g/dL (ref 30.0–36.0)
MCV: 93.9 fL (ref 80.0–100.0)
Platelets: 257 10*3/uL (ref 150–400)
RBC: 3.12 MIL/uL — ABNORMAL LOW (ref 4.22–5.81)
RDW: 19 % — ABNORMAL HIGH (ref 11.5–15.5)
WBC: 21.9 10*3/uL — ABNORMAL HIGH (ref 4.0–10.5)
nRBC: 0 % (ref 0.0–0.2)

## 2020-05-26 LAB — AMMONIA: Ammonia: 30 umol/L (ref 9–35)

## 2020-05-26 MED ORDER — THIAMINE HCL 100 MG PO TABS: 100.0000 mg | ORAL_TABLET | Freq: Every day | ORAL | Status: DC

## 2020-05-26 MED ORDER — BISACODYL 10 MG RE SUPP: 10.0000 mg | Freq: Every day | RECTAL | Status: DC | PRN

## 2020-05-26 MED ORDER — DIPHENHYDRAMINE HCL 12.5 MG/5ML PO ELIX: 12.5000 mg | ORAL_SOLUTION | Freq: Four times a day (QID) | ORAL | Status: DC | PRN

## 2020-05-26 MED ORDER — ACETAMINOPHEN 325 MG PO TABS: 325.0000 mg | ORAL_TABLET | ORAL | Status: DC | PRN

## 2020-05-26 MED ORDER — POLYETHYLENE GLYCOL 3350 17 G PO PACK: 17.0000 g | PACK | Freq: Every day | ORAL | Status: DC | PRN

## 2020-05-26 MED ORDER — POTASSIUM CHLORIDE CRYS ER 20 MEQ PO TBCR
20.0000 meq | EXTENDED_RELEASE_TABLET | Freq: Every day | ORAL | Status: DC
  Administered 2020-05-26 – 2020-05-29 (×4): 20 meq via ORAL
  Filled 2020-05-26 (×4): qty 1

## 2020-05-26 MED ORDER — PREDNISOLONE 5 MG PO TABS: 40.0000 mg | ORAL_TABLET | Freq: Every day | ORAL | Status: DC

## 2020-05-26 MED ORDER — ALUM & MAG HYDROXIDE-SIMETH 200-200-20 MG/5ML PO SUSP: 30.0000 mL | ORAL | Status: DC | PRN

## 2020-05-26 MED ORDER — OXYCODONE HCL 5 MG PO TABS: 5.0000 mg | ORAL_TABLET | Freq: Four times a day (QID) | ORAL | Status: DC | PRN

## 2020-05-26 MED ORDER — TRAZODONE HCL 50 MG PO TABS
25.0000 mg | ORAL_TABLET | Freq: Every evening | ORAL | Status: DC | PRN
  Administered 2020-05-28: 50 mg via ORAL
  Filled 2020-05-26: qty 1

## 2020-05-26 MED ORDER — GUAIFENESIN-DM 100-10 MG/5ML PO SYRP: 5.0000 mL | ORAL_SOLUTION | Freq: Four times a day (QID) | ORAL | Status: DC | PRN

## 2020-05-26 MED ORDER — FLEET ENEMA 7-19 GM/118ML RE ENEM: 1.0000 | ENEMA | Freq: Once | RECTAL | Status: DC | PRN

## 2020-05-26 MED ORDER — CAMPHOR-MENTHOL 0.5-0.5 % EX LOTN
TOPICAL_LOTION | Freq: Three times a day (TID) | CUTANEOUS | Status: DC
  Administered 2020-05-26 – 2020-05-27 (×2): 1 via TOPICAL
  Filled 2020-05-26: qty 222

## 2020-05-26 MED ORDER — PROCHLORPERAZINE 25 MG RE SUPP: 12.5000 mg | Freq: Four times a day (QID) | RECTAL | Status: DC | PRN

## 2020-05-26 MED ORDER — PROCHLORPERAZINE MALEATE 5 MG PO TABS: 5.0000 mg | ORAL_TABLET | Freq: Four times a day (QID) | ORAL | Status: DC | PRN

## 2020-05-26 MED ORDER — PROCHLORPERAZINE EDISYLATE 10 MG/2ML IJ SOLN: 5.0000 mg | Freq: Four times a day (QID) | INTRAMUSCULAR | Status: DC | PRN

## 2020-05-26 MED ORDER — AMLODIPINE BESYLATE 5 MG PO TABS: 5.0000 mg | ORAL_TABLET | Freq: Every day | ORAL | Status: DC

## 2020-05-26 MED ORDER — PANTOPRAZOLE SODIUM 40 MG PO TBEC: 40.0000 mg | DELAYED_RELEASE_TABLET | Freq: Two times a day (BID) | ORAL | Status: DC

## 2020-05-26 MED ORDER — LACTULOSE 10 GM/15ML PO SOLN: 20.0000 g | Freq: Two times a day (BID) | ORAL | 0 refills | Status: DC

## 2020-05-26 MED ORDER — PANTOPRAZOLE SODIUM 40 MG PO TBEC: 40.0000 mg | DELAYED_RELEASE_TABLET | Freq: Every day | ORAL | Status: DC

## 2020-05-26 MED ORDER — PNEUMOCOCCAL VAC POLYVALENT 25 MCG/0.5ML IJ INJ
0.5000 mL | INJECTION | INTRAMUSCULAR | Status: AC
  Administered 2020-05-29: 0.5 mL via INTRAMUSCULAR
  Filled 2020-05-26 (×2): qty 0.5

## 2020-05-26 MED ORDER — LEVOTHYROXINE SODIUM 25 MCG PO TABS: 25.0000 ug | ORAL_TABLET | Freq: Every day | ORAL | Status: DC

## 2020-05-26 MED ORDER — RIFAXIMIN 550 MG PO TABS
550.0000 mg | ORAL_TABLET | Freq: Two times a day (BID) | ORAL | Status: DC
Start: 2020-05-26 — End: 2020-05-29

## 2020-05-26 NOTE — Progress Notes (Signed)
Pt arrived via bed wife with patient. Pt's VSS , denies any pain. Pt and wife educated to [employer], rehabilitation guidelines and Surveyor, quantity. Pt has call light in reach bed alarm engaged bed in low position

## 2020-05-26 NOTE — PMR Pre-admission (Signed)
PMR Admission Coordinator Pre-Admission Assessment  Patient: Jimmy Gonzales is an 49 y.o., male MRN: [id number] DOB: Jun 24, 1971 Height: 6' (182.9 cm) Weight: 98.7 kg  Insurance Information HMO:     PPO: Yes     PCP:      IPA:      80/20:      OTHER:  PRIMARY: [employer] [place]      Policy#: [id number]      Subscriber: patient CM Name: Phillis Haggis (follow up with Elspeth Cho)      Phone#: [phone]     Fax#: [phone] Pre-Cert#: [id number]      Employer:   Benefits:  Phone #: [phone]     Name: Thora Lance. Date: 09/28/19-09/26/20     Deduct: $3,500 ($3,500 met)     Out of Pocket Max: $3,000 ($3,000 met)      Life Max: NA CIR: 70% coverage, 30% co-insurance      SNF: 70% coverage, 30% co-insurance; limited to 60 days Outpatient: $50 copay per visit; limited to 30 combined rehabilitative visits     Co-Pay:  Home Health: 70% coverage, limited with medical necessity      Co-Pay: 30% DME: 70% coverage     Co-Pay: 30% Providers: in-network SECONDARY:       Policy#:      Phone#:   Development worker, community:       Phone#:   [employer] for patients in Inpatient Rehabilitation Facilities with attached Privacy Act [place] Records was provided and verbally reviewed with: N/A  Emergency Contact Information Contact Information    Name Relation Home Work Mobile   Glandorf Spouse   [phone]      Current Medical History  Patient Admitting Diagnosis: Debility post GI bleed and TIPS procedure  History of Present Illness: A 49 year old male who has a past medical history significant for drinking 1/2 gallon of hard liquor a day who presented to [facility] with a chief complaint of hematemesis on August 18. Apparently he had developed abdominal pain and nausea and vomiting which was initially nonbilious nonbloody for 2 days prior to admission. He was on the way to see his physician on August 18 when he suddenly developed  hematemesis. In the emergency department he was noted to have a large ventral hernia which was incarcerated based on CT scanning. This was easily reduced and he was admitted to the intensive care unit on an octreotide infusion. However, hematemesis and hematochezia persisted. He required intubation, central line placement, vasopressor infusion, multiple blood transfusions, and emergent endoscopy. Unfortunately endoscopy was incomplete as the patient had significant bleeding. 7 bands were placed but esophageal variceal bleeding was unable to be controlled. Reportedly Covid negative. He was transferred to [facility] for emergent TIPS. 2 units PRBC given during the procedure. PCCM accepted the patient for ICU admission.  PT/OT evaluations were completed with recommendations for inpatient rehab.    Patient's medical record from [facility] has been reviewed by the rehabilitation admission coordinator and physician.  Past Medical History  History reviewed. No pertinent past medical history.  Family History   family history is not on file.  Prior Rehab/Hospitalizations Has the patient had prior rehab or hospitalizations prior to admission? No  Has the patient had major surgery during 100 days prior to admission? Yes   Current Medications  Current Facility-Administered Medications:    0.9 %  sodium chloride infusion, 250 mL, Intravenous, Continuous, Bowser, Laurel Dimmer, NP, Stopped at 05/21/20 1746  amLODipine (NORVASC) tablet 5 mg, 5 mg, Oral, Daily, Pahwani, Ravi, MD, 5 mg at 05/26/20 1049   Chlorhexidine Gluconate Cloth 2 % PADS 6 each, 6 each, Topical, Daily, Bowser, Laurel Dimmer, NP, 6 each at 05/25/20 2116   feeding supplement (ENSURE ENLIVE) (ENSURE ENLIVE) liquid 237 mL, 237 mL, Oral, BID BM, Agarwala, Ravi, MD, 237 mL at 05/26/20 1048   hydrALAZINE (APRESOLINE) injection 10-20 mg, 10-20 mg, Intravenous, Q4H PRN, Corey Harold, NP, 20 mg at 05/19/20 0554   lactulose  (CHRONULAC) 10 GM/15ML solution 20 g, 20 g, Oral, BID, Agarwala, Ravi, MD, 20 g at 05/26/20 1048   levothyroxine (SYNTHROID) tablet 25 mcg, 25 mcg, Oral, Q0600, Kipp Brood, MD, 25 mcg at 05/26/20 0531   LORazepam (ATIVAN) injection 2 mg, 2 mg, Intravenous, Q4H PRN, Kipp Brood, MD, 2 mg at 05/23/20 2209   oxyCODONE (Oxy IR/ROXICODONE) immediate release tablet 5 mg, 5 mg, Oral, Q6H PRN, Bowser, Laurel Dimmer, NP   pantoprazole (PROTONIX) EC tablet 40 mg, 40 mg, Oral, BID, 40 mg at 05/26/20 1048 **FOLLOWED BY** [START ON 06/03/2020] pantoprazole (PROTONIX) EC tablet 40 mg, 40 mg, Oral, Daily, Agarwala, Ravi, MD   prednisoLONE tablet 40 mg, 40 mg, Oral, Daily, Ronnette Juniper, MD, 40 mg at 05/26/20 1049   rifaximin (XIFAXAN) tablet 550 mg, 550 mg, Oral, BID, Blenda Nicely, RPH, 550 mg at 05/26/20 1048   sodium chloride flush (NS) 0.9 % injection 10-40 mL, 10-40 mL, Intracatheter, Q12H, Icard, Bradley L, DO, 10 mL at 05/26/20 1056   sodium chloride flush (NS) 0.9 % injection 10-40 mL, 10-40 mL, Intracatheter, PRN, Icard, Bradley L, DO   thiamine tablet 100 mg, 100 mg, Oral, Daily, Agarwala, Ravi, MD, 100 mg at 05/26/20 1048  Patients Current Diet:  Diet Order            Diet 2 gram sodium Room service appropriate? Yes; Fluid consistency: Thin  Diet effective now                 Precautions / Restrictions Precautions Precautions: Fall Restrictions Weight Bearing Restrictions: No   Has the patient had 2 or more falls or a fall with injury in the past year? No  Prior Activity Level Community (5-7x/wk): Went out daily, was driving, works Medical laboratory scientific officer.  Prior Functional Level Self Care: Did the patient need help bathing, dressing, using the toilet or eating? Independent  Indoor Mobility: Did the patient need assistance with walking from room to room (with or without device)? Independent  Stairs: Did the patient need assistance with internal or external stairs (with or without device)?  Independent  Functional Cognition: Did the patient need help planning regular tasks such as shopping or remembering to take medications? Independent  Home Assistive Devices / Equipment Home Equipment: None  Prior Device Use: Indicate devices/aids used by the patient prior to current illness, exacerbation or injury? None of the above  Current Functional Level Cognition  Overall Cognitive Status: Impaired/Different from baseline Current Attention Level: Sustained Orientation Level: Oriented X4 Following Commands: Follows one step commands with increased time Safety/Judgement: Decreased awareness of safety, Decreased awareness of deficits General Comments: pt adamant about the date 8/26, Friday despite max directional verbal cues and re-direction.     Extremity Assessment (includes Sensation/Coordination)  Upper Extremity Assessment: RUE deficits/detail, LUE deficits/detail RUE Deficits / Details: grossly 3+/5, decreaesd coordination  RUE Coordination: decreased fine motor, decreased gross motor LUE Deficits / Details: grossly 3+/5, decreaesd coordination  LUE Coordination: decreased fine motor, decreased  gross motor  Lower Extremity Assessment: Defer to PT evaluation    ADLs  Overall ADL's : Needs assistance/impaired Grooming: Minimal assistance, Sitting Upper Body Bathing: Minimal assistance, Sitting Lower Body Bathing: Moderate assistance, +2 for physical assistance, +2 for safety/equipment, Sit to/from stand Upper Body Dressing : Minimal assistance, Sitting Lower Body Dressing: Maximal assistance, +2 for physical assistance, +2 for safety/equipment, Sit to/from stand Toilet Transfer: Moderate assistance, +2 for physical assistance, +2 for safety/equipment, Ambulation Toilet Transfer Details (indicate cue type and reason): simulated to recliner  Functional mobility during ADLs: Moderate assistance, +2 for physical assistance, +2 for safety/equipment, Cueing for sequencing, Cueing  for safety General ADL Comments: pt limited by impaired cognition, decreased coordination, impaired balance and generalized weakness     Mobility  Overal bed mobility: Needs Assistance Bed Mobility: Supine to Sit Supine to sit: Supervision General bed mobility comments: max directional verbal cues to complete task, pt with frequent stops asking, what do you want me to do?    Transfers  Overall transfer level: Needs assistance Equipment used: None Transfers: Sit to/from Stand Sit to Stand: Min assist General transfer comment: minA to power up and minA to slow descent    Ambulation / Gait / Stairs / Wheelchair Mobility  Ambulation/Gait Ambulation/Gait assistance: Herbalist (Feet): 250 Feet Assistive device: 1 person hand held assist Gait Pattern/deviations: Step-to pattern, Drifts right/left, Wide base of support General Gait Details: pt with shortened step to gait with significantly reduced gait speed. limited arm swing bilaterally, then later excessive arm swing when cued to relax UEs. Pt drifts laterally with multiple minor losses of balance Gait velocity: reduced Gait velocity interpretation: <1.8 ft/sec, indicate of risk for recurrent falls Stairs: Yes Stairs assistance: Min assist, Mod assist Stair Management: No rails Number of Stairs: 2 General stair comments: modA for one posterior LOB when descending steps    Posture / Balance Dynamic Sitting Balance Sitting balance - Comments: requires min guard to min assist dynamically with donning socks  Balance Overall balance assessment: Needs assistance Sitting-balance support: No upper extremity supported, Feet supported Sitting balance-Leahy Scale: Fair Sitting balance - Comments: requires min guard to min assist dynamically with donning socks  Standing balance support: No upper extremity supported Standing balance-Leahy Scale: Fair Standing balance comment: dependent on UE assist Standardized Balance  Assessment Standardized Balance Assessment : Berg Balance Test Berg Balance Test Sit to Stand: Able to stand  independently using hands Standing Unsupported: Able to stand 2 minutes with supervision Sitting with Back Unsupported but Feet Supported on Floor or Stool: Able to sit safely and securely 2 minutes Stand to Sit: Controls descent by using hands Transfers: Able to transfer with verbal cueing and /or supervision Standing Unsupported with Eyes Closed: Able to stand 3 seconds Standing Ubsupported with Feet Together: Able to place feet together independently but unable to hold for 30 seconds From Standing, Reach Forward with Outstretched Arm: Reaches forward but needs supervision From Standing Position, Pick up Object from Floor: Able to pick up shoe, needs supervision From Standing Position, Turn to Look Behind Over each Shoulder: Needs supervision when turning Turn 360 Degrees: Needs close supervision or verbal cueing Standing Unsupported, Alternately Place Feet on Step/Stool: Needs assistance to keep from falling or unable to try Standing Unsupported, One Foot in Front: Needs help to step but can hold 15 seconds Standing on One Leg: Tries to lift leg/unable to hold 3 seconds but remains standing independently Total Score: 27    Special needs/care consideration  Skin Abrasion: buttocks/Right, Upper; Cracking: buttocks/Mid; Incision: Right abdomen, Behavioral consideration encephalopathic and Designated visitor Danile Trier, wife   Previous Home Environment (from acute therapy documentation) Living Arrangements: Spouse/significant other Available Help at Discharge: Family, Available 24 hours/day Type of Home: House Home Layout: Two level, Able to live on main level with bedroom/bathroom Home Access: Stairs to enter Entrance Stairs-Number of Steps: 1 Bathroom Shower/Tub: Multimedia programmer: Handicapped height  Discharge Living Setting Plans for Discharge Living  Setting: Patient's home, House, Lives with (comment) (Lives with wife, Anne Ng) Type of Home at Discharge: House Discharge Home Layout: Two level, Full bath on main level, Able to live on main level with bedroom/bathroom Alternate Level Stairs-Number of Steps: 17-18 steps, but does not have to go upstairs Discharge Home Access: Stairs to enter Entrance Stairs-Rails: None Entrance Stairs-Number of Steps: 1 step entry Discharge Bathroom Shower/Tub: Walk-in shower, Door Discharge Bathroom Toilet: Standard Discharge Bathroom Accessibility: Yes How Accessible: Accessible via walker Does the patient have any problems obtaining your medications?: No  Social/Family/Support Systems Patient Roles: Spouse, Parent (Has wife and a 85 yo child.) Contact Information: Jerel Sardina - wife - [phone] Anticipated Caregiver: wife Ability/Limitations of Caregiver: Wife does not work and can assist and provide supervision after rehab stay Caregiver Availability: 24/7 Discharge Plan Discussed with Primary Caregiver: Yes (Met with wife and patient at the bedside.) Is Caregiver In Agreement with Plan?: Yes Does Caregiver/Family have Issues with Lodging/Transportation while Pt is in Rehab?: No  Goals Patient/Family Goal for Rehab: PT/OT S/min assist goals Expected length of stay: 10-14 days Cultural Considerations: None Pt/Family Agrees to Admission and willing to participate: Yes Program Orientation Provided & Reviewed with Pt/Caregiver Including Roles  & Responsibilities: Yes  Decrease burden of Care through IP rehab admission: NA  Possible need for SNF placement upon discharge: NA  Patient Condition: I have reviewed medical records from [facility], spoken with CM, and patient and spouse. I met with patient at the bedside for inpatient rehabilitation assessment.  Patient will benefit from ongoing PT, OT and SLP, can actively participate in 3 hours of therapy a day 5 days of the week, and  can make measurable gains during the admission.  Patient will also benefit from the coordinated team approach during an Inpatient Acute Rehabilitation admission.  The patient will receive intensive therapy as well as Rehabilitation physician, nursing, social worker, and care management interventions.  Due to safety, skin/wound care, disease management, medication administration, pain management and patient education the patient requires 24 hour a day rehabilitation nursing.  The patient is currently Min A 250' with mobility and Min A-Max A +2 with basic ADLs.  Discharge setting and therapy post discharge at home with home health is anticipated.  Patient has agreed to participate in the Acute Inpatient Rehabilitation Program and will admit today.  Preadmission Screen Completed By:  Bethel Born, 05/26/2020 2:17 PM ______________________________________________________________________   Discussed status with Dr. Naaman Plummer on 05/26/20  at 2:17 PM and received approval for admission today.  Admission Coordinator:  Bethel Born, CCC-SLP, time 2:17 PM/Date 05/26/20    Assessment/Plan: Diagnosis: debility/encephalopathy after GIB 1. Does the need for close, 24 hr/day Medical supervision in concert with the patient's rehab needs make it unreasonable for this patient to be served in a less intensive setting? Yes 2. Co-Morbidities requiring supervision/potential complications: ABLA, ETOH abuse 3. Due to bladder management, bowel management, safety, skin/wound care, disease management, medication administration, pain management and patient education, does the  patient require 24 hr/day rehab nursing? Yes 4. Does the patient require coordinated care of a physician, rehab nurse, PT, OT to address physical and functional deficits in the context of the above medical diagnosis(es)? Yes Addressing deficits in the following areas: balance, endurance, locomotion, strength, transferring, bowel/bladder  control, bathing, dressing, feeding, grooming, toileting and psychosocial support 5. Can the patient actively participate in an intensive therapy program of at least 3 hrs of therapy 5 days a week? Yes 6. The potential for patient to make measurable gains while on inpatient rehab is excellent 7. Anticipated functional outcomes upon discharge from inpatient rehab: modified independent and supervision PT, modified independent and supervision OT, n/a SLP 8. Estimated rehab length of stay to reach the above functional goals is: 7-10 days 9. Anticipated discharge destination: Home 10. Overall Rehab/Functional Prognosis: excellent   MD Signature: Meredith Staggers, MD, [facility] 05/26/2020

## 2020-05-26 NOTE — Discharge Summary (Addendum)
Physician Discharge Summary  Mali R Niblett LEX:[id number] DOB: 1971/07/02 DOA: 05/15/2020  PCP: Patient, No Pcp Per  Admit date: 05/15/2020 Discharge date: 05/26/2020  Recommendations for Outpatient Follow-up:  1. Discharge to Inpatient Rehab 2. Follow up with interventional radiology as directed. 3. Follow up with Gastroenterology as directed. 4. Follow up with PCP within 7-10 days of discharge from rehab. 5. Get second [place] vaccination in 3 weeks from first shot which you should receive on 05/26/2020.   Follow-up Information    Aletta Edouard, MD Follow up.   Specialties: Interventional Radiology, Radiology Why: You will hear from schedulers with date and time of follow-up appointment.  Contact information: [address] [place] [address] [phone]              Discharge Diagnoses: Principal diagnosis is #1 1. Acute hypoxic respiratory failure 2. Acute blood loss anemia due to GI bleed 3. GI bleed due to esophageal varices 4. Encephalopathy due to metabolic causes, withdrawal, and hepatic encephalopathy. 5. Debility 6. Decompensated hepatic cirrhosis, S/P TIPS 7. Portal hypertension 8. Coagulopathy  Discharge Condition: Fair  Disposition: CIR  Diet recommendation: Heart healthy  Filed Weights   05/24/20 0500 05/25/20 0535 05/26/20 0500  Weight: 104.5 kg 98.6 kg 98.7 kg    History of present illness:  This is a 49 year old male who has a past medical history significant for drinking 1/2 gallon of hard liquor a day who presented to [facility] with a chief complaint of hematemesis on August 18.  Apparently he had developed abdominal pain and nausea and vomiting which was initially nonbilious nonbloody for 2 days prior to admission.  He was on the way to see his physician on August 18 when he suddenly developed hematemesis.  In the emergency department he was noted to have a large ventral hernia which was incarcerated based on CT  scanning.  This was easily reduced and he was admitted to the intensive care unit on an octreotide infusion.  However, hematemesis and hematochezia persisted.  He required intubation, central line placement, vasopressor infusion, multiple blood transfusions, and emergent endoscopy.  Unfortunately endoscopy was incomplete as the patient had significant bleeding.  7 bands were placed but esophageal variceal bleeding was unable to be controlled.  Reportedly Covid negative. He was transferred to [facility] for emergent TIPS. 2 units PRBC given during the procedure. PCCM accepted the patient for ICU admission.   Hospital Course:  Significant Hospital Events   8/ 18 admitted [facility] 8/ 19 emergent endoscopy, banding of variceal lesions in esophagus, transferred to [facility] for emergent TIPS procedure 8/ 20 continued blood product administration  8/23 extubated but remains confused 8/24 improving confusion.    Procedures:  August 19 endotracheal tube August 19 right IJ central venous line August 19 arterial line    Significant Diagnostic Tests:  August 18 CT abdomen > high grade SBO with transition at small umbilical hernia containing a portion on the mid jejunum. Cirrhosis with portal venous hypertension and marked abdominal varices.    Micro Data:  August 19 SARS-CoV-2> negative Hepatitis A IgM ab neg Hepatitis Bs antigen negative Hepatitis B core IGM negative Hepatitis C antibody 0.1   On 05/23/2020 the patient went to IT for paracentesis. However he was found to have insufficient fluid for extraction. On the early morning hours the patient had an episode of confusion, agitation, and tachycardia. Relieved with haldol. The patient was started on low dose seroquel on 05/24/2020. He  was much better on the morning of 05/25/2020, but his wife was concerned, because the patient had an episode of incontinence. It has been stopped.  On the morning of 05/26/2020 the patient  was awake alert and oriented x 3. His wife is at bedside and agrees that the patient is much better. He is appropriate for discharge to CIR. He will receive the first of the 2 [place] vaccinations for COVID-19 before discharge to CIR.   Today's assessment: S: The patient is resting comfortably. No new complaints. O: Vitals:  Vitals:   05/26/20 0436 05/26/20 1540  BP: (!) 143/83 135/89  Pulse: 100 90  Resp: 18 16  Temp: 98.6 F (37 C) 98.3 F (36.8 C)  SpO2: 95% 97%   Exam:   Constitutional:   The patient is awake, alert, and oriented x 3.  No acute distress. Respiratory:   No increased work of breathing.  No wheezes, rales, or rhonchi  No tactile fremitus Cardiovascular:   Regular rate and rhythm  No murmurs, ectopy, or gallups.  No lateral PMI. No thrills. Abdomen:   Abdomen is soft, non-tender, non-distended  No hernias, masses, or organomegaly  Normoactive bowel sounds.  Musculoskeletal:   No cyanosis, clubbing, or edema Skin:   No rashes, lesions, ulcers  palpation of skin: no induration or nodules Neurologic:   CN 2-12 intact  Sensation all 4 extremities intact Psychiatric:  1. Mental status ? Mood, affect appropriate ? Orientation to person, place, time today  Discharge Instructions   No Known Allergies  The results of significant diagnostics from this hospitalization (including imaging, microbiology, ancillary and laboratory) are listed below for reference.    Significant Diagnostic Studies: IR Angiogram Selective Each Additional Vessel  Result Date: 05/16/2020 CLINICAL DATA:  Alcoholic liver disease and acute bleeding esophageal varices. Persistent bleeding despite endoscopic variceal banding and critical illness with hypotension and shock requiring blood transfusion, pressor therapy and intubation. The patient has been emergently transferred from [facility] for a TIPS procedure. EXAM: 1. TRANSJUGULAR INTRAHEPATIC PORTOSYSTEMIC  SHUNT PLACEMENT 2. ADDITIONAL CATHETERIZATION OF LEFT GASTRIC VEIN AND TWO ADDITIONAL VARICEAL TRUNKS OFF OF PORTAL CONFLUENCE AND SPLENIC VEIN 3. TRANSCATHETER EMBOLIZATION OF VARICES FOR HEMORRHAGE ASSISTANT: ADAM HENN, M.D. MEDICATIONS: As antibiotic prophylaxis, 2 g IV Ancef was ordered pre-procedure and administered intravenously within one hour of incision. ANESTHESIA/SEDATION: General anesthesia-as administered by the Anesthesia department CONTRAST:  100 mL Omnipaque 300 FLUOROSCOPY TIME:  Fluoroscopy Time: 89 minutes and 30 seconds. 3961 mGy. COMPLICATIONS: None immediate. PROCEDURE: Informed written consent was obtained from the patient's wife after a thorough discussion of the procedural risks, benefits and alternatives. All questions were addressed. Maximal Sterile Barrier Technique was utilized including caps, mask, sterile gowns, sterile gloves, sterile drape, hand hygiene and skin antiseptic. A timeout was performed prior to the initiation of the procedure. The right neck and abdominal wall were prepped with chlorhexidine and draped. Ultrasound was performed of the liver. Under direct ultrasound guidance, portal vein access was performed in the right lobe with a 21 gauge needle. A guidewire was advanced. A 3 French dilator was then advanced over the wire and into the portal vein. Portal venography was performed through the dilator. Ultrasound was used to confirm patency of the right internal jugular vein. Under direct ultrasound guidance, access of the right internal jugular vein was performed with a 21 gauge needle and micropuncture set. Over a guidewire, venous access was dilated and a 10 French angled sheath advanced. The sheath was positioned in the  superior aspect of the inferior vena cava. A 5 French catheter was then used to selectively catheterize the right hepatic vein. The catheter was advanced in the right hepatic vein and selective hepatic venography performed. The 10 French sheath was  advanced into the right hepatic vein. A Colapinto needle and needle sheath were advanced through the 10 French sheath. The needle was then advanced in the liver parenchyma and used to gain access to the level of the right portal vein utilizing the portal 3 [place] dilator as a target for needle puncture. After gaining access into the right portal venous system, a guidewire was advanced. A catheter was then able to be advanced over the guidewire and into the portal vein via hepatic venous access. Portal venography was performed. Over a guidewire, the intraparenchymal tract between the right hepatic vein and right portal vein was then dilated utilizing 5 mm and 7 mm diameter balloons. This allowed advancement of the 10 French sheath into the portal vein. A marking pigtail catheter was then utilized in performing venography to estimate length of covered stent required for shunt placement. A Gore Viatorr covered stent prosthesis with diameter of 10 mm, covered stent length of 8 cm and uncovered length of 2 cm was chosen for placement. The stent was advanced through the sheath and deployed. After deployment additional venography was performed. The entire prosthesis was dilated to 8 mm. A 5 French catheter was used to selectively catheterize the left gastric vein. Selective venography was performed. The catheter was further advanced into the vein. Embolization coils were then advanced through the 5 French catheter to the level of varices. The 5 French catheter was used to selectively catheterize an additional variceal trunk off of the splenic vein. Selective venography was performed. A Lantern microcatheter was advanced through the 5 [place] catheter. Multiple Ruby microcoils were than deployed via the microcatheter within varices. An Amplatzer Vascular Plug 4 was deployed through the 5 [place] catheter. A third variceal trunk off of the splenic vein was then catheterized with a 5 French catheter. The Lantern microcatheter  was advanced through the 5 [place] catheter. Multiple Ruby microcoils were deployed within varices. Additional venography was performed during embolization to assess flow. Additional venography was performed as well as portosystemic pressure measurements. The TIPS shunt was dilated to 10 mm. Additional pressure measurements were made. The 3 French portal vein dilator was removed and a dressing applied at the abdominal exit site. The 10 French sheath was then removed from the right jugular access site and hemostasis obtained with manual compression. A dressing was applied at the exit site. FINDINGS: After portal venous access portal venography demonstrates widely patent main portal vein and intrahepatic left and right portal veins. No evidence of portal vein thrombus. Slow flow is present but flow is towards the liver. After hepatic vein catheterization, the right hepatic vein demonstrates normal patency. After establishing needle access between the right hepatic vein and right portal vein, portal venography demonstrates a large left gastric vein emanating from the portal confluence and supplying esophageal varices as well as 2 additional trunks emanating from the central aspect of the splenic vein and supplying esophageal varices. A TIPS shunt was successfully created. After covered stent deployment, variceal embolization was performed at the level of 3 separate trunks supplying varices with deployment of embolization coils as well as an Amplatzer Vascular Plug. There was successful diminishment in flow to varices. After TIPS creation and dilatation there was excellent flow through the shunt. Initial pressure gradient measurements  demonstrated a portosystemic pressure gradient of approximately 7-8 mm Hg. With repeat measurement there did appear to be some additional gradient at the level of the hepatic vein just beyond the stent stented segment near the hepatic vein confluence. Venography did not demonstrate  significant stenosis of the vein in this region and this segment of the vein was dilated to 10 mm. It was not felt necessary to place another stent or covered stent to extend the stented segment. IMPRESSION: Successful placement of TIPS shunt between right hepatic vein and right portal vein with placement of 10 mm diameter Viatorr covered stent dilated to 10 mm. The left gastric vein and 2 additional trunks supplying prominent esophageal varices were also embolized with embolization coils a vascular plug. Electronically Signed   By: Aletta Edouard M.D.   On: 05/16/2020 13:21   IR Angiogram Selective Each Additional Vessel  Result Date: 05/16/2020 CLINICAL DATA:  Alcoholic liver disease and acute bleeding esophageal varices. Persistent bleeding despite endoscopic variceal banding and critical illness with hypotension and shock requiring blood transfusion, pressor therapy and intubation. The patient has been emergently transferred from [facility] for a TIPS procedure. EXAM: 1. TRANSJUGULAR INTRAHEPATIC PORTOSYSTEMIC SHUNT PLACEMENT 2. ADDITIONAL CATHETERIZATION OF LEFT GASTRIC VEIN AND TWO ADDITIONAL VARICEAL TRUNKS OFF OF PORTAL CONFLUENCE AND SPLENIC VEIN 3. TRANSCATHETER EMBOLIZATION OF VARICES FOR HEMORRHAGE ASSISTANT: ADAM HENN, M.D. MEDICATIONS: As antibiotic prophylaxis, 2 g IV Ancef was ordered pre-procedure and administered intravenously within one hour of incision. ANESTHESIA/SEDATION: General anesthesia-as administered by the Anesthesia department CONTRAST:  100 mL Omnipaque 300 FLUOROSCOPY TIME:  Fluoroscopy Time: 89 minutes and 30 seconds. 3961 mGy. COMPLICATIONS: None immediate. PROCEDURE: Informed written consent was obtained from the patient's wife after a thorough discussion of the procedural risks, benefits and alternatives. All questions were addressed. Maximal Sterile Barrier Technique was utilized including caps, mask, sterile gowns, sterile gloves, sterile drape, hand hygiene and skin  antiseptic. A timeout was performed prior to the initiation of the procedure. The right neck and abdominal wall were prepped with chlorhexidine and draped. Ultrasound was performed of the liver. Under direct ultrasound guidance, portal vein access was performed in the right lobe with a 21 gauge needle. A guidewire was advanced. A 3 French dilator was then advanced over the wire and into the portal vein. Portal venography was performed through the dilator. Ultrasound was used to confirm patency of the right internal jugular vein. Under direct ultrasound guidance, access of the right internal jugular vein was performed with a 21 gauge needle and micropuncture set. Over a guidewire, venous access was dilated and a 10 French angled sheath advanced. The sheath was positioned in the superior aspect of the inferior vena cava. A 5 French catheter was then used to selectively catheterize the right hepatic vein. The catheter was advanced in the right hepatic vein and selective hepatic venography performed. The 10 French sheath was advanced into the right hepatic vein. A Colapinto needle and needle sheath were advanced through the 10 French sheath. The needle was then advanced in the liver parenchyma and used to gain access to the level of the right portal vein utilizing the portal 3 [place] dilator as a target for needle puncture. After gaining access into the right portal venous system, a guidewire was advanced. A catheter was then able to be advanced over the guidewire and into the portal vein via hepatic venous access. Portal venography was performed. Over a guidewire, the intraparenchymal tract between the right hepatic vein and right portal vein  was then dilated utilizing 5 mm and 7 mm diameter balloons. This allowed advancement of the 10 French sheath into the portal vein. A marking pigtail catheter was then utilized in performing venography to estimate length of covered stent required for shunt placement. A Gore Viatorr  covered stent prosthesis with diameter of 10 mm, covered stent length of 8 cm and uncovered length of 2 cm was chosen for placement. The stent was advanced through the sheath and deployed. After deployment additional venography was performed. The entire prosthesis was dilated to 8 mm. A 5 French catheter was used to selectively catheterize the left gastric vein. Selective venography was performed. The catheter was further advanced into the vein. Embolization coils were then advanced through the 5 French catheter to the level of varices. The 5 French catheter was used to selectively catheterize an additional variceal trunk off of the splenic vein. Selective venography was performed. A Lantern microcatheter was advanced through the 5 [place] catheter. Multiple Ruby microcoils were than deployed via the microcatheter within varices. An Amplatzer Vascular Plug 4 was deployed through the 5 [place] catheter. A third variceal trunk off of the splenic vein was then catheterized with a 5 French catheter. The Lantern microcatheter was advanced through the 5 [place] catheter. Multiple Ruby microcoils were deployed within varices. Additional venography was performed during embolization to assess flow. Additional venography was performed as well as portosystemic pressure measurements. The TIPS shunt was dilated to 10 mm. Additional pressure measurements were made. The 3 French portal vein dilator was removed and a dressing applied at the abdominal exit site. The 10 French sheath was then removed from the right jugular access site and hemostasis obtained with manual compression. A dressing was applied at the exit site. FINDINGS: After portal venous access portal venography demonstrates widely patent main portal vein and intrahepatic left and right portal veins. No evidence of portal vein thrombus. Slow flow is present but flow is towards the liver. After hepatic vein catheterization, the right hepatic vein demonstrates normal  patency. After establishing needle access between the right hepatic vein and right portal vein, portal venography demonstrates a large left gastric vein emanating from the portal confluence and supplying esophageal varices as well as 2 additional trunks emanating from the central aspect of the splenic vein and supplying esophageal varices. A TIPS shunt was successfully created. After covered stent deployment, variceal embolization was performed at the level of 3 separate trunks supplying varices with deployment of embolization coils as well as an Amplatzer Vascular Plug. There was successful diminishment in flow to varices. After TIPS creation and dilatation there was excellent flow through the shunt. Initial pressure gradient measurements demonstrated a portosystemic pressure gradient of approximately 7-8 mm Hg. With repeat measurement there did appear to be some additional gradient at the level of the hepatic vein just beyond the stent stented segment near the hepatic vein confluence. Venography did not demonstrate significant stenosis of the vein in this region and this segment of the vein was dilated to 10 mm. It was not felt necessary to place another stent or covered stent to extend the stented segment. IMPRESSION: Successful placement of TIPS shunt between right hepatic vein and right portal vein with placement of 10 mm diameter Viatorr covered stent dilated to 10 mm. The left gastric vein and 2 additional trunks supplying prominent esophageal varices were also embolized with embolization coils a vascular plug. Electronically Signed   By: Aletta Edouard M.D.   On: 05/16/2020 13:21   IR Angiogram  Selective Each Additional Vessel  Result Date: 05/16/2020 CLINICAL DATA:  Alcoholic liver disease and acute bleeding esophageal varices. Persistent bleeding despite endoscopic variceal banding and critical illness with hypotension and shock requiring blood transfusion, pressor therapy and intubation. The patient  has been emergently transferred from [facility] for a TIPS procedure. EXAM: 1. TRANSJUGULAR INTRAHEPATIC PORTOSYSTEMIC SHUNT PLACEMENT 2. ADDITIONAL CATHETERIZATION OF LEFT GASTRIC VEIN AND TWO ADDITIONAL VARICEAL TRUNKS OFF OF PORTAL CONFLUENCE AND SPLENIC VEIN 3. TRANSCATHETER EMBOLIZATION OF VARICES FOR HEMORRHAGE ASSISTANT: ADAM HENN, M.D. MEDICATIONS: As antibiotic prophylaxis, 2 g IV Ancef was ordered pre-procedure and administered intravenously within one hour of incision. ANESTHESIA/SEDATION: General anesthesia-as administered by the Anesthesia department CONTRAST:  100 mL Omnipaque 300 FLUOROSCOPY TIME:  Fluoroscopy Time: 89 minutes and 30 seconds. 3961 mGy. COMPLICATIONS: None immediate. PROCEDURE: Informed written consent was obtained from the patient's wife after a thorough discussion of the procedural risks, benefits and alternatives. All questions were addressed. Maximal Sterile Barrier Technique was utilized including caps, mask, sterile gowns, sterile gloves, sterile drape, hand hygiene and skin antiseptic. A timeout was performed prior to the initiation of the procedure. The right neck and abdominal wall were prepped with chlorhexidine and draped. Ultrasound was performed of the liver. Under direct ultrasound guidance, portal vein access was performed in the right lobe with a 21 gauge needle. A guidewire was advanced. A 3 French dilator was then advanced over the wire and into the portal vein. Portal venography was performed through the dilator. Ultrasound was used to confirm patency of the right internal jugular vein. Under direct ultrasound guidance, access of the right internal jugular vein was performed with a 21 gauge needle and micropuncture set. Over a guidewire, venous access was dilated and a 10 French angled sheath advanced. The sheath was positioned in the superior aspect of the inferior vena cava. A 5 French catheter was then used to selectively catheterize the right hepatic vein.  The catheter was advanced in the right hepatic vein and selective hepatic venography performed. The 10 French sheath was advanced into the right hepatic vein. A Colapinto needle and needle sheath were advanced through the 10 French sheath. The needle was then advanced in the liver parenchyma and used to gain access to the level of the right portal vein utilizing the portal 3 [place] dilator as a target for needle puncture. After gaining access into the right portal venous system, a guidewire was advanced. A catheter was then able to be advanced over the guidewire and into the portal vein via hepatic venous access. Portal venography was performed. Over a guidewire, the intraparenchymal tract between the right hepatic vein and right portal vein was then dilated utilizing 5 mm and 7 mm diameter balloons. This allowed advancement of the 10 French sheath into the portal vein. A marking pigtail catheter was then utilized in performing venography to estimate length of covered stent required for shunt placement. A Gore Viatorr covered stent prosthesis with diameter of 10 mm, covered stent length of 8 cm and uncovered length of 2 cm was chosen for placement. The stent was advanced through the sheath and deployed. After deployment additional venography was performed. The entire prosthesis was dilated to 8 mm. A 5 French catheter was used to selectively catheterize the left gastric vein. Selective venography was performed. The catheter was further advanced into the vein. Embolization coils were then advanced through the 5 French catheter to the level of varices. The 5 French catheter was used to selectively catheterize an additional variceal  trunk off of the splenic vein. Selective venography was performed. A Lantern microcatheter was advanced through the 5 [place] catheter. Multiple Ruby microcoils were than deployed via the microcatheter within varices. An Amplatzer Vascular Plug 4 was deployed through the 5 [place] catheter. A  third variceal trunk off of the splenic vein was then catheterized with a 5 French catheter. The Lantern microcatheter was advanced through the 5 [place] catheter. Multiple Ruby microcoils were deployed within varices. Additional venography was performed during embolization to assess flow. Additional venography was performed as well as portosystemic pressure measurements. The TIPS shunt was dilated to 10 mm. Additional pressure measurements were made. The 3 French portal vein dilator was removed and a dressing applied at the abdominal exit site. The 10 French sheath was then removed from the right jugular access site and hemostasis obtained with manual compression. A dressing was applied at the exit site. FINDINGS: After portal venous access portal venography demonstrates widely patent main portal vein and intrahepatic left and right portal veins. No evidence of portal vein thrombus. Slow flow is present but flow is towards the liver. After hepatic vein catheterization, the right hepatic vein demonstrates normal patency. After establishing needle access between the right hepatic vein and right portal vein, portal venography demonstrates a large left gastric vein emanating from the portal confluence and supplying esophageal varices as well as 2 additional trunks emanating from the central aspect of the splenic vein and supplying esophageal varices. A TIPS shunt was successfully created. After covered stent deployment, variceal embolization was performed at the level of 3 separate trunks supplying varices with deployment of embolization coils as well as an Amplatzer Vascular Plug. There was successful diminishment in flow to varices. After TIPS creation and dilatation there was excellent flow through the shunt. Initial pressure gradient measurements demonstrated a portosystemic pressure gradient of approximately 7-8 mm Hg. With repeat measurement there did appear to be some additional gradient at the level of the  hepatic vein just beyond the stent stented segment near the hepatic vein confluence. Venography did not demonstrate significant stenosis of the vein in this region and this segment of the vein was dilated to 10 mm. It was not felt necessary to place another stent or covered stent to extend the stented segment. IMPRESSION: Successful placement of TIPS shunt between right hepatic vein and right portal vein with placement of 10 mm diameter Viatorr covered stent dilated to 10 mm. The left gastric vein and 2 additional trunks supplying prominent esophageal varices were also embolized with embolization coils a vascular plug. Electronically Signed   By: Aletta Edouard M.D.   On: 05/16/2020 13:21   IR Tips  Result Date: 05/16/2020 CLINICAL DATA:  Alcoholic liver disease and acute bleeding esophageal varices. Persistent bleeding despite endoscopic variceal banding and critical illness with hypotension and shock requiring blood transfusion, pressor therapy and intubation. The patient has been emergently transferred from [facility] for a TIPS procedure. EXAM: 1. TRANSJUGULAR INTRAHEPATIC PORTOSYSTEMIC SHUNT PLACEMENT 2. ADDITIONAL CATHETERIZATION OF LEFT GASTRIC VEIN AND TWO ADDITIONAL VARICEAL TRUNKS OFF OF PORTAL CONFLUENCE AND SPLENIC VEIN 3. TRANSCATHETER EMBOLIZATION OF VARICES FOR HEMORRHAGE ASSISTANT: ADAM HENN, M.D. MEDICATIONS: As antibiotic prophylaxis, 2 g IV Ancef was ordered pre-procedure and administered intravenously within one hour of incision. ANESTHESIA/SEDATION: General anesthesia-as administered by the Anesthesia department CONTRAST:  100 mL Omnipaque 300 FLUOROSCOPY TIME:  Fluoroscopy Time: 89 minutes and 30 seconds. 3961 mGy. COMPLICATIONS: None immediate. PROCEDURE: Informed written consent was obtained from the patient's wife after a  thorough discussion of the procedural risks, benefits and alternatives. All questions were addressed. Maximal Sterile Barrier Technique was utilized including  caps, mask, sterile gowns, sterile gloves, sterile drape, hand hygiene and skin antiseptic. A timeout was performed prior to the initiation of the procedure. The right neck and abdominal wall were prepped with chlorhexidine and draped. Ultrasound was performed of the liver. Under direct ultrasound guidance, portal vein access was performed in the right lobe with a 21 gauge needle. A guidewire was advanced. A 3 French dilator was then advanced over the wire and into the portal vein. Portal venography was performed through the dilator. Ultrasound was used to confirm patency of the right internal jugular vein. Under direct ultrasound guidance, access of the right internal jugular vein was performed with a 21 gauge needle and micropuncture set. Over a guidewire, venous access was dilated and a 10 French angled sheath advanced. The sheath was positioned in the superior aspect of the inferior vena cava. A 5 French catheter was then used to selectively catheterize the right hepatic vein. The catheter was advanced in the right hepatic vein and selective hepatic venography performed. The 10 French sheath was advanced into the right hepatic vein. A Colapinto needle and needle sheath were advanced through the 10 French sheath. The needle was then advanced in the liver parenchyma and used to gain access to the level of the right portal vein utilizing the portal 3 [place] dilator as a target for needle puncture. After gaining access into the right portal venous system, a guidewire was advanced. A catheter was then able to be advanced over the guidewire and into the portal vein via hepatic venous access. Portal venography was performed. Over a guidewire, the intraparenchymal tract between the right hepatic vein and right portal vein was then dilated utilizing 5 mm and 7 mm diameter balloons. This allowed advancement of the 10 French sheath into the portal vein. A marking pigtail catheter was then utilized in performing venography  to estimate length of covered stent required for shunt placement. A Gore Viatorr covered stent prosthesis with diameter of 10 mm, covered stent length of 8 cm and uncovered length of 2 cm was chosen for placement. The stent was advanced through the sheath and deployed. After deployment additional venography was performed. The entire prosthesis was dilated to 8 mm. A 5 French catheter was used to selectively catheterize the left gastric vein. Selective venography was performed. The catheter was further advanced into the vein. Embolization coils were then advanced through the 5 French catheter to the level of varices. The 5 French catheter was used to selectively catheterize an additional variceal trunk off of the splenic vein. Selective venography was performed. A Lantern microcatheter was advanced through the 5 [place] catheter. Multiple Ruby microcoils were than deployed via the microcatheter within varices. An Amplatzer Vascular Plug 4 was deployed through the 5 [place] catheter. A third variceal trunk off of the splenic vein was then catheterized with a 5 French catheter. The Lantern microcatheter was advanced through the 5 [place] catheter. Multiple Ruby microcoils were deployed within varices. Additional venography was performed during embolization to assess flow. Additional venography was performed as well as portosystemic pressure measurements. The TIPS shunt was dilated to 10 mm. Additional pressure measurements were made. The 3 French portal vein dilator was removed and a dressing applied at the abdominal exit site. The 10 French sheath was then removed from the right jugular access site and hemostasis obtained with manual compression. A dressing was applied  at the exit site. FINDINGS: After portal venous access portal venography demonstrates widely patent main portal vein and intrahepatic left and right portal veins. No evidence of portal vein thrombus. Slow flow is present but flow is towards the liver.  After hepatic vein catheterization, the right hepatic vein demonstrates normal patency. After establishing needle access between the right hepatic vein and right portal vein, portal venography demonstrates a large left gastric vein emanating from the portal confluence and supplying esophageal varices as well as 2 additional trunks emanating from the central aspect of the splenic vein and supplying esophageal varices. A TIPS shunt was successfully created. After covered stent deployment, variceal embolization was performed at the level of 3 separate trunks supplying varices with deployment of embolization coils as well as an Amplatzer Vascular Plug. There was successful diminishment in flow to varices. After TIPS creation and dilatation there was excellent flow through the shunt. Initial pressure gradient measurements demonstrated a portosystemic pressure gradient of approximately 7-8 mm Hg. With repeat measurement there did appear to be some additional gradient at the level of the hepatic vein just beyond the stent stented segment near the hepatic vein confluence. Venography did not demonstrate significant stenosis of the vein in this region and this segment of the vein was dilated to 10 mm. It was not felt necessary to place another stent or covered stent to extend the stented segment. IMPRESSION: Successful placement of TIPS shunt between right hepatic vein and right portal vein with placement of 10 mm diameter Viatorr covered stent dilated to 10 mm. The left gastric vein and 2 additional trunks supplying prominent esophageal varices were also embolized with embolization coils a vascular plug. Electronically Signed   By: Aletta Edouard M.D.   On: 05/16/2020 13:21   DG CHEST PORT 1 VIEW  Result Date: 05/17/2020 CLINICAL DATA:  49 year old intubated male. Evaluate tube and catheter positions. EXAM: PORTABLE CHEST 1 VIEW COMPARISON:  Chest x-ray 05/16/2020. FINDINGS: An endotracheal tube is in place with tip 2.1  cm above the carina. There is a right-sided subclavian central venous catheter with tip terminating in the distal superior vena cava. A nasogastric tube is seen extending into the stomach, however, the tip of the nasogastric tube extends below the lower margin of the image. Lung volumes are low with bibasilar opacities which are favored to reflect areas of subsegmental atelectasis. No definite consolidative airspace disease. No pleural effusions. No evidence of pulmonary edema. No pneumothorax. Heart size is normal. Upper mediastinal contours are within normal limits. Aortic atherosclerosis. IMPRESSION: 1. Support apparatus, as above. 2. Low lung volumes with bibasilar areas of subsegmental atelectasis. 3. Aortic atherosclerosis. Electronically Signed   By: Vinnie Langton M.D.   On: 05/17/2020 10:35   DG CHEST PORT 1 VIEW  Result Date: 05/16/2020 CLINICAL DATA:  Hypoxia EXAM: PORTABLE CHEST 1 VIEW COMPARISON:  May 16, 2020 study obtained earlier in the day FINDINGS: Endotracheal tube tip is 1.8 cm above the carina. Central catheter tip is in the superior vena cava. Nasogastric tube tip and side port are below the diaphragm. No pneumothorax. There is bibasilar atelectasis, more on the left than on the right. Lungs elsewhere are clear. Heart is upper normal in size with pulmonary vascularity normal. No adenopathy. No bone lesions. IMPRESSION: Tube and catheter positions as described without pneumothorax. Bibasilar atelectasis, slightly more on the left than on the right. Lungs elsewhere clear. Stable cardiac silhouette. Electronically Signed   By: Lowella Grip III M.D.   On: 05/16/2020 10:54  DG Chest Port 1 View  Result Date: 05/16/2020 CLINICAL DATA:  Endotracheally intubated. EXAM: PORTABLE CHEST 1 VIEW COMPARISON:  Radiograph yesterday. FINDINGS: Endotracheal tube tip is at the level of the clavicular heads 5.3 cm from the carina. Enteric tube in place with tip below the diaphragm not included  in the field of view. Right subclavian central line unchanged. Stable heart size and mediastinal contours. Mild bibasilar atelectasis. No pneumothorax or pleural effusion. IMPRESSION: 1. Endotracheal tube tip at the level of the clavicular heads 5.3 cm from the carina. Enteric tube in place with tip below the diaphragm not included in the field of view. 2. Right central line tip in the SVC. 3. Mild bibasilar atelectasis. Electronically Signed   By: Keith Rake M.D.   On: 05/16/2020 00:33   DG Abd 2 Views  Result Date: 05/22/2020 CLINICAL DATA:  49 year old male with abdominal distension. Small-bowel obstruction on CT 05/04/2020. EXAM: ABDOMEN - 2 VIEW COMPARISON:  Radiographs 05/17/2020 and earlier. FINDINGS: Upright and supine views. No free air under the diaphragm. Enteric tube has been removed. Upper abdominal embolization coils and sequelae of TIPS again noted. Improved bowel gas pattern, with only mildly dilated gas-filled distal small bowel and large bowel now. Negative visible lung bases. No acute osseous abnormality identified. Bilateral hip arthroplasty. IMPRESSION: 1. Improved bowel gas pattern since 05/17/2020. Possible mild residual ileus now. No free air. 2. Enteric tube removed. Sequelae of TIPS and upper abdominal coil embolization. Electronically Signed   By: Genevie Ann M.D.   On: 05/22/2020 13:31   DG Abd Portable 1V  Result Date: 05/17/2020 CLINICAL DATA:  Gastric distension EXAM: PORTABLE ABDOMEN - 1 VIEW COMPARISON:  05/16/2020 FINDINGS: Supine frontal view of the abdomen and pelvis excludes the hemidiaphragms, right flank, and lower pelvis by collimation. TIPS identified right upper quadrant. Embolic coils central upper abdomen. Enteric catheter projects over the gastric antrum. The stomach is markedly distended. Continued small bowel obstruction, with decreased caliber of the distended gas-filled loops of small bowel. No masses or abnormal calcifications. IMPRESSION: 1. Distension of  the stomach, with indwelling enteric catheter as above. 2. Persistent small-bowel obstruction, with slight decrease in caliber of the distended small bowel loops. Electronically Signed   By: Randa Ngo M.D.   On: 05/17/2020 20:36   DG Abd Portable 1V  Result Date: 05/16/2020 CLINICAL DATA:  OG tube placement EXAM: PORTABLE ABDOMEN - 1 VIEW COMPARISON:  CT 05/14/2020 FINDINGS: Transesophageal tube tip terminates in the right upper quadrant likely at the level of the gastric antrum/duodenal bulb with side port beyond the GE junction. Upper abdominal vascular stent and embolization coils are noted. Telemetry leads overlie the upper abdomen as well. Redemonstration of the diffuse air distended and clustered small bowel in the mid abdomen compatible with a high-grade obstruction seen on comparison CT. No acute osseous abnormality. Bilateral total hip arthroplasties are noted. IMPRESSION: 1. Transesophageal tube tip terminates in the right upper quadrant likely at the level of the gastric antrum/duodenal bulb. Side port beyond the GE junction. 2. Persistent high-grade small bowel obstruction seen on comparison CT. Electronically Signed   By: Lovena Le M.D.   On: 05/16/2020 01:29   IR ABDOMEN US LIMITED  Result Date: 05/24/2020 CLINICAL DATA:  49 year old with cirrhosis and recent TIPS procedure for variceal bleeding. Evaluate for ascites and paracentesis. EXAM: LIMITED ABDOMEN ULTRASOUND FOR ASCITES TECHNIQUE: Limited ultrasound survey for ascites was performed in all four abdominal quadrants. COMPARISON:  Abdominal CT 05/14/2020 FINDINGS: No significant ascites  identified in the abdomen. IMPRESSION: No ascites. Electronically Signed   By: Markus Daft M.D.   On: 05/24/2020 11:07   IR EMBO ART  VEN HEMORR LYMPH EXTRAV  INC GUIDE ROADMAPPING  Result Date: 05/16/2020 CLINICAL DATA:  Alcoholic liver disease and acute bleeding esophageal varices. Persistent bleeding despite endoscopic variceal banding and  critical illness with hypotension and shock requiring blood transfusion, pressor therapy and intubation. The patient has been emergently transferred from [facility] for a TIPS procedure. EXAM: 1. TRANSJUGULAR INTRAHEPATIC PORTOSYSTEMIC SHUNT PLACEMENT 2. ADDITIONAL CATHETERIZATION OF LEFT GASTRIC VEIN AND TWO ADDITIONAL VARICEAL TRUNKS OFF OF PORTAL CONFLUENCE AND SPLENIC VEIN 3. TRANSCATHETER EMBOLIZATION OF VARICES FOR HEMORRHAGE ASSISTANT: ADAM HENN, M.D. MEDICATIONS: As antibiotic prophylaxis, 2 g IV Ancef was ordered pre-procedure and administered intravenously within one hour of incision. ANESTHESIA/SEDATION: General anesthesia-as administered by the Anesthesia department CONTRAST:  100 mL Omnipaque 300 FLUOROSCOPY TIME:  Fluoroscopy Time: 89 minutes and 30 seconds. 3961 mGy. COMPLICATIONS: None immediate. PROCEDURE: Informed written consent was obtained from the patient's wife after a thorough discussion of the procedural risks, benefits and alternatives. All questions were addressed. Maximal Sterile Barrier Technique was utilized including caps, mask, sterile gowns, sterile gloves, sterile drape, hand hygiene and skin antiseptic. A timeout was performed prior to the initiation of the procedure. The right neck and abdominal wall were prepped with chlorhexidine and draped. Ultrasound was performed of the liver. Under direct ultrasound guidance, portal vein access was performed in the right lobe with a 21 gauge needle. A guidewire was advanced. A 3 French dilator was then advanced over the wire and into the portal vein. Portal venography was performed through the dilator. Ultrasound was used to confirm patency of the right internal jugular vein. Under direct ultrasound guidance, access of the right internal jugular vein was performed with a 21 gauge needle and micropuncture set. Over a guidewire, venous access was dilated and a 10 French angled sheath advanced. The sheath was positioned in the superior  aspect of the inferior vena cava. A 5 French catheter was then used to selectively catheterize the right hepatic vein. The catheter was advanced in the right hepatic vein and selective hepatic venography performed. The 10 French sheath was advanced into the right hepatic vein. A Colapinto needle and needle sheath were advanced through the 10 French sheath. The needle was then advanced in the liver parenchyma and used to gain access to the level of the right portal vein utilizing the portal 3 [place] dilator as a target for needle puncture. After gaining access into the right portal venous system, a guidewire was advanced. A catheter was then able to be advanced over the guidewire and into the portal vein via hepatic venous access. Portal venography was performed. Over a guidewire, the intraparenchymal tract between the right hepatic vein and right portal vein was then dilated utilizing 5 mm and 7 mm diameter balloons. This allowed advancement of the 10 French sheath into the portal vein. A marking pigtail catheter was then utilized in performing venography to estimate length of covered stent required for shunt placement. A Gore Viatorr covered stent prosthesis with diameter of 10 mm, covered stent length of 8 cm and uncovered length of 2 cm was chosen for placement. The stent was advanced through the sheath and deployed. After deployment additional venography was performed. The entire prosthesis was dilated to 8 mm. A 5 French catheter was used to selectively catheterize the left gastric vein. Selective venography was performed. The catheter was further  advanced into the vein. Embolization coils were then advanced through the 5 French catheter to the level of varices. The 5 French catheter was used to selectively catheterize an additional variceal trunk off of the splenic vein. Selective venography was performed. A Lantern microcatheter was advanced through the 5 [place] catheter. Multiple Ruby microcoils were than  deployed via the microcatheter within varices. An Amplatzer Vascular Plug 4 was deployed through the 5 [place] catheter. A third variceal trunk off of the splenic vein was then catheterized with a 5 French catheter. The Lantern microcatheter was advanced through the 5 [place] catheter. Multiple Ruby microcoils were deployed within varices. Additional venography was performed during embolization to assess flow. Additional venography was performed as well as portosystemic pressure measurements. The TIPS shunt was dilated to 10 mm. Additional pressure measurements were made. The 3 French portal vein dilator was removed and a dressing applied at the abdominal exit site. The 10 French sheath was then removed from the right jugular access site and hemostasis obtained with manual compression. A dressing was applied at the exit site. FINDINGS: After portal venous access portal venography demonstrates widely patent main portal vein and intrahepatic left and right portal veins. No evidence of portal vein thrombus. Slow flow is present but flow is towards the liver. After hepatic vein catheterization, the right hepatic vein demonstrates normal patency. After establishing needle access between the right hepatic vein and right portal vein, portal venography demonstrates a large left gastric vein emanating from the portal confluence and supplying esophageal varices as well as 2 additional trunks emanating from the central aspect of the splenic vein and supplying esophageal varices. A TIPS shunt was successfully created. After covered stent deployment, variceal embolization was performed at the level of 3 separate trunks supplying varices with deployment of embolization coils as well as an Amplatzer Vascular Plug. There was successful diminishment in flow to varices. After TIPS creation and dilatation there was excellent flow through the shunt. Initial pressure gradient measurements demonstrated a portosystemic pressure gradient of  approximately 7-8 mm Hg. With repeat measurement there did appear to be some additional gradient at the level of the hepatic vein just beyond the stent stented segment near the hepatic vein confluence. Venography did not demonstrate significant stenosis of the vein in this region and this segment of the vein was dilated to 10 mm. It was not felt necessary to place another stent or covered stent to extend the stented segment. IMPRESSION: Successful placement of TIPS shunt between right hepatic vein and right portal vein with placement of 10 mm diameter Viatorr covered stent dilated to 10 mm. The left gastric vein and 2 additional trunks supplying prominent esophageal varices were also embolized with embolization coils a vascular plug. Electronically Signed   By: Aletta Edouard M.D.   On: 05/16/2020 13:21    Microbiology: Recent Results (from the past 240 hour(s))  MRSA PCR Screening     Status: Abnormal   Collection Time: 05/17/20  8:36 AM   Specimen: Nasal Mucosa; Nasopharyngeal  Result Value Ref Range Status   MRSA by PCR (A) NEGATIVE Final    INVALID, UNABLE TO DETERMINE THE PRESENCE OF TARGET DUE TO SPECIMEN INTEGRITY. RECOLLECTION REQUESTED.    Comment: CRITICAL RESULT CALLED TO, READ BACK BY AND VERIFIED WITH: RN JENNIFER CARMICHAEL AT 1217 05/17/20        The GeneXpert MRSA Assay (FDA approved for NASAL specimens only), is one component of a comprehensive MRSA colonization surveillance program. It is not intended to  diagnose MRSA infection nor to guide or monitor treatment for MRSA infections. Performed at [facility], [place] [address] [place], [place] [address]   MRSA PCR Screening     Status: None   Collection Time: 05/17/20  6:17 PM   Specimen: Nasal Mucosa; Nasopharyngeal  Result Value Ref Range Status   MRSA by PCR NEGATIVE NEGATIVE Final    Comment:        The GeneXpert MRSA Assay (FDA approved for NASAL specimens only), is one component of a comprehensive MRSA  colonization surveillance program. It is not intended to diagnose MRSA infection nor to guide or monitor treatment for MRSA infections. Performed at [facility], [place] [address], [place], [place] [address]      Labs: Basic Metabolic Panel: Recent Labs  Lab 05/22/20 0444 05/23/20 0149 05/24/20 0102 05/24/20 1424 05/26/20 0209  NA 140 140 137 138 140  K 3.9 3.0* 3.0* 4.5 3.5  CL 101 104 101 103 102  CO2 29 26 24 24 25   GLUCOSE 120* 123* 117* 120* 99  BUN 8 7 6 7 8   CREATININE 0.69 0.71 0.60* 0.68 0.70  CALCIUM 8.6* 8.2* 8.3* 8.8* 8.7*  MG  --  1.8  --   --   --    Liver Function Tests: Recent Labs  Lab 05/22/20 0444 05/24/20 0102 05/26/20 0209  AST 138* 126* 125*  ALT 69* 73* 79*  ALKPHOS 173* 187* 192*  BILITOT 6.3* 4.5* 4.0*  PROT 6.7 6.6 6.4*  ALBUMIN 2.7* 2.7* 2.5*   No results for input(s): LIPASE, AMYLASE in the last 168 hours. Recent Labs  Lab 05/24/20 0102  AMMONIA 53*   CBC: Recent Labs  Lab 05/22/20 0444 05/23/20 0149 05/24/20 0102 05/25/20 0328 05/26/20 0209  WBC 20.7* 21.4* 21.8* 20.7* 21.9*  NEUTROABS  --   --  19.2*  --   --   HGB 9.6* 9.4* 9.3* 9.2* 9.4*  HCT 30.5* 29.4* 28.9* 28.7* 29.3*  MCV 92.4 92.7 92.0 94.1 93.9  PLT 205 262 262 251 257   Cardiac Enzymes: No results for input(s): CKTOTAL, CKMB, CKMBINDEX, TROPONINI in the last 168 hours. BNP: BNP (last 3 results) No results for input(s): BNP in the last 8760 hours.  ProBNP (last 3 results) No results for input(s): PROBNP in the last 8760 hours.  CBG: Recent Labs  Lab 05/20/20 1957 05/20/20 2342 05/21/20 0333 05/21/20 0914 05/21/20 2007  GLUCAP 122* 91 97 107* 147*    Active Problems:   Hemorrhagic shock (HCC)   Gastric bleed   Small bowel obstruction (HCC)   Acute respiratory failure with hypoxia (HCC)   Decompensated hepatic cirrhosis ([place])   Acute kidney injury ([place])   Time coordinating discharge: 38 mintues.  Signed:        Seydou Hearns,  DO [facility]  05/26/2020, 6:31 PM

## 2020-05-26 NOTE — Progress Notes (Signed)
   Covid-19 Vaccination Clinic  Name:  Jimmy Gonzales    MRN: [id number] DOB: 08/28/1971  05/26/2020  [name] was observed post Covid-19 immunization for 15 minutes without incident. He was provided with Vaccine Information Sheet and instruction to access the V-Safe system.   [name] was instructed to call 911 with any severe reactions post vaccine: Marland Kitchen Difficulty breathing  . Swelling of face and throat  . A fast heartbeat  . A bad rash all over body  . Dizziness and weakness   Immunizations Administered    Name Date Dose VIS Date Route   Moderna COVID-19 Vaccine 05/26/2020 12:04 PM 0.5 mL 08/2019 Intramuscular   Manufacturer: Moderna   Lot: [id number]   [place]: 02585-277-82

## 2020-05-26 NOTE — Progress Notes (Signed)
There is a bed available for pt to admit to CIR today.  Dr. Benny Lennert is in agreement. TOC, pt/family, and NSG made aware.   Gayland Curry, [place], [place] Admissions Coordinator [phone]

## 2020-05-26 NOTE — Progress Notes (Signed)
PMR Admission Coordinator Pre-Admission Assessment ° °Patient: Jimmy Gonzales is an 49 y.o., male °MRN: [id number] °DOB: 12/13/1970 °Height: 6' (182.9 cm) °Weight: 98.7 kg ° °Insurance Information °HMO:     PPO: Yes     PCP:      IPA:      80/20:      OTHER:  °PRIMARY: [employer] of [place]      Policy#: [id number]      Subscriber: patient °CM Name: Cherly Kinsey (follow up with Amy Phillips)      Phone#: [phone]     Fax#: [phone] °Pre-Cert#: [id number]      Employer:   °Benefits:  Phone #: [phone]     Name: Kristy °Eff. Date: 09/28/19-09/26/20     Deduct: $3,500 ($3,500 met)     Out of Pocket Max: $3,000 ($3,000 met)      Life Max: NA °CIR: 70% coverage, 30% co-insurance      SNF: 70% coverage, 30% co-insurance; limited to 60 days °Outpatient: $50 copay per visit; limited to 30 combined rehabilitative visits     Co-Pay:  °Home Health: 70% coverage, limited with medical necessity      Co-Pay: 30% °DME: 70% coverage     Co-Pay: 30% °Providers: in-network °SECONDARY:       Policy#:      Phone#:  ° °Financial Counselor:       Phone#:  ° °The “Data Collection Information Summary” for patients in Inpatient Rehabilitation Facilities with attached “Privacy Act Statement-Health Care Records” was provided and verbally reviewed with: N/A ° °Emergency Contact Information °Contact Information   ° Name Relation Home Work Mobile  ° [place]osa,Annette Spouse   [phone]  °  ° ° °Current Medical History  °Patient Admitting Diagnosis: Debility post GI bleed and TIPS procedure ° °History of Present Illness: A 49-year-old male who has a past medical history significant for drinking 1/2 gallon of hard liquor a day who presented to [facility] with a chief complaint of hematemesis on August 18.  Apparently he had developed abdominal pain and nausea and vomiting which was initially nonbilious nonbloody for 2 days prior to admission.  He was on the way to see his physician on August 18 when he suddenly developed  hematemesis.  In the emergency department he was noted to have a large ventral hernia which was incarcerated based on CT scanning.  This was easily reduced and he was admitted to the intensive care unit on an octreotide infusion. However, hematemesis and hematochezia persisted.  He required intubation, central line placement, vasopressor infusion, multiple blood transfusions, and emergent endoscopy. Unfortunately endoscopy was incomplete as the patient had significant bleeding.  7 bands were placed but esophageal variceal bleeding was unable to be controlled.  Reportedly Covid negative. He was transferred to [facility] for emergent TIPS. 2 units PRBC given during the procedure. PCCM accepted the patient for ICU admission.  PT/OT evaluations were completed with recommendations for inpatient rehab. °  ° °Patient's medical record from [facility] has been reviewed by the rehabilitation admission coordinator and physician. ° °Past Medical History  °History reviewed. No pertinent past medical history. ° °Family History   °family history is not on file. ° °Prior Rehab/Hospitalizations °Has the patient had prior rehab or hospitalizations prior to admission? No ° °Has the patient had major surgery during 100 days prior to admission? Yes  ° °Current Medications ° °Current Facility-Administered Medications:  °•  0.9 %  sodium chloride infusion, 250 mL, Intravenous, Continuous, Bowser, Grace E, NP, Stopped at 05/21/20 1746 °•    amLODipine (NORVASC) tablet 5 mg, 5 mg, Oral, Daily, Pahwani, Ravi, MD, 5 mg at 05/26/20 1049 °•  Chlorhexidine Gluconate Cloth 2 % PADS 6 each, 6 each, Topical, Daily, Bowser, Grace E, NP, 6 each at 05/25/20 2116 °•  feeding supplement (ENSURE ENLIVE) (ENSURE ENLIVE) liquid 237 mL, 237 mL, Oral, BID BM, Agarwala, Ravi, MD, 237 mL at 05/26/20 1048 °•  hydrALAZINE (APRESOLINE) injection 10-20 mg, 10-20 mg, Intravenous, Q4H PRN, Hoffman, Paul W, NP, 20 mg at 05/19/20 0554 °•  lactulose  (CHRONULAC) 10 GM/15ML solution 20 g, 20 g, Oral, BID, Agarwala, Ravi, MD, 20 g at 05/26/20 1048 °•  levothyroxine (SYNTHROID) tablet 25 mcg, 25 mcg, Oral, Q0600, Agarwala, Ravi, MD, 25 mcg at 05/26/20 0531 °•  LORazepam (ATIVAN) injection 2 mg, 2 mg, Intravenous, Q4H PRN, Agarwala, Ravi, MD, 2 mg at 05/23/20 2209 °•  oxyCODONE (Oxy IR/ROXICODONE) immediate release tablet 5 mg, 5 mg, Oral, Q6H PRN, Bowser, Grace E, NP °•  pantoprazole (PROTONIX) EC tablet 40 mg, 40 mg, Oral, BID, 40 mg at 05/26/20 1048 **FOLLOWED BY** [START ON 06/03/2020] pantoprazole (PROTONIX) EC tablet 40 mg, 40 mg, Oral, Daily, Agarwala, Ravi, MD °•  prednisoLONE tablet 40 mg, 40 mg, Oral, Daily, Karki, Arya, MD, 40 mg at 05/26/20 1049 °•  rifaximin (XIFAXAN) tablet 550 mg, 550 mg, Oral, BID, Patton, Karen K, RPH, 550 mg at 05/26/20 1048 °•  sodium chloride flush (NS) 0.9 % injection 10-40 mL, 10-40 mL, Intracatheter, Q12H, Icard, Bradley L, DO, 10 mL at 05/26/20 1056 °•  sodium chloride flush (NS) 0.9 % injection 10-40 mL, 10-40 mL, Intracatheter, PRN, Icard, Bradley L, DO °•  thiamine tablet 100 mg, 100 mg, Oral, Daily, Agarwala, Ravi, MD, 100 mg at 05/26/20 1048 ° °Patients Current Diet:  °Diet Order   °       °  Diet 2 gram sodium Room service appropriate? Yes; Fluid consistency: Thin  Diet effective now       °  °  °  °  ° ° °Precautions / Restrictions °Precautions °Precautions: Fall °Restrictions °Weight Bearing Restrictions: No  ° °Has the patient had 2 or more falls or a fall with injury in the past year? No ° °Prior Activity Level °Community (5-7x/wk): Went out daily, was driving, works FT. ° °Prior Functional Level °Self Care: Did the patient need help bathing, dressing, using the toilet or eating? Independent ° °Indoor Mobility: Did the patient need assistance with walking from room to room (with or without device)? Independent ° °Stairs: Did the patient need assistance with internal or external stairs (with or without device)?  Independent ° °Functional Cognition: Did the patient need help planning regular tasks such as shopping or remembering to take medications? Independent ° °Home Assistive Devices / Equipment °Home Equipment: None ° °Prior Device Use: Indicate devices/aids used by the patient prior to current illness, exacerbation or injury? None of the above ° °Current Functional Level °Cognition ° Overall Cognitive Status: Impaired/Different from baseline °Current Attention Level: Sustained °Orientation Level: Oriented X4 °Following Commands: Follows one step commands with increased time °Safety/Judgement: Decreased awareness of safety, Decreased awareness of deficits °General Comments: pt adamant about the date 8/26, Friday despite max directional verbal cues and re-direction.  °   °Extremity Assessment °(includes Sensation/Coordination) ° Upper Extremity Assessment: RUE deficits/detail, LUE deficits/detail °RUE Deficits / Details: grossly 3+/5, decreaesd coordination  °RUE Coordination: decreased fine motor, decreased gross motor °LUE Deficits / Details: grossly 3+/5, decreaesd coordination  °LUE Coordination: decreased fine motor, decreased   gross motor  °Lower Extremity Assessment: Defer to PT evaluation  °  °ADLs ° Overall ADL's : Needs assistance/impaired °Grooming: Minimal assistance, Sitting °Upper Body Bathing: Minimal assistance, Sitting °Lower Body Bathing: Moderate assistance, +2 for physical assistance, +2 for safety/equipment, Sit to/from stand °Upper Body Dressing : Minimal assistance, Sitting °Lower Body Dressing: Maximal assistance, +2 for physical assistance, +2 for safety/equipment, Sit to/from stand °Toilet Transfer: Moderate assistance, +2 for physical assistance, +2 for safety/equipment, Ambulation °Toilet Transfer Details (indicate cue type and reason): simulated to recliner  °Functional mobility during ADLs: Moderate assistance, +2 for physical assistance, +2 for safety/equipment, Cueing for sequencing, Cueing  for safety °General ADL Comments: pt limited by impaired cognition, decreased coordination, impaired balance and generalized weakness   °  °Mobility ° Overal bed mobility: Needs Assistance °Bed Mobility: Supine to Sit °Supine to sit: Supervision °General bed mobility comments: max directional verbal cues to complete task, pt with frequent stops asking, what do you want me to do?  °  °Transfers ° Overall transfer level: Needs assistance °Equipment used: None °Transfers: Sit to/from Stand °Sit to Stand: Min assist °General transfer comment: minA to power up and minA to slow descent  °  °Ambulation / Gait / Stairs / Wheelchair Mobility ° Ambulation/Gait °Ambulation/Gait assistance: Min assist °Gait Distance (Feet): 250 Feet °Assistive device: 1 person hand held assist °Gait Pattern/deviations: Step-to pattern, Drifts right/left, Wide base of support °General Gait Details: pt with shortened step to gait with significantly reduced gait speed. limited arm swing bilaterally, then later excessive arm swing when cued to relax UEs. Pt drifts laterally with multiple minor losses of balance °Gait velocity: reduced °Gait velocity interpretation: <1.8 ft/sec, indicate of risk for recurrent falls °Stairs: Yes °Stairs assistance: Min assist, Mod assist °Stair Management: No rails °Number of Stairs: 2 °General stair comments: modA for one posterior LOB when descending steps  °  °Posture / Balance Dynamic Sitting Balance °Sitting balance - Comments: requires min guard to min assist dynamically with donning socks  °Balance °Overall balance assessment: Needs assistance °Sitting-balance support: No upper extremity supported, Feet supported °Sitting balance-Leahy Scale: Fair °Sitting balance - Comments: requires min guard to min assist dynamically with donning socks  °Standing balance support: No upper extremity supported °Standing balance-Leahy Scale: Fair °Standing balance comment: dependent on UE assist °Standardized Balance  Assessment °Standardized Balance Assessment : Berg Balance Test °Berg Balance Test °Sit to Stand: Able to stand  independently using hands °Standing Unsupported: Able to stand 2 minutes with supervision °Sitting with Back Unsupported but Feet Supported on Floor or Stool: Able to sit safely and securely 2 minutes °Stand to Sit: Controls descent by using hands °Transfers: Able to transfer with verbal cueing and /or supervision °Standing Unsupported with Eyes Closed: Able to stand 3 seconds °Standing Ubsupported with Feet Together: Able to place feet together independently but unable to hold for 30 seconds °From Standing, Reach Forward with Outstretched Arm: Reaches forward but needs supervision °From Standing Position, Pick up Object from Floor: Able to pick up shoe, needs supervision °From Standing Position, Turn to Look Behind Over each Shoulder: Needs supervision when turning °Turn 360 Degrees: Needs close supervision or verbal cueing °Standing Unsupported, Alternately Place Feet on Step/Stool: Needs assistance to keep from falling or unable to try °Standing Unsupported, One Foot in Front: Needs help to step but can hold 15 seconds °Standing on One Leg: Tries to lift leg/unable to hold 3 seconds but remains standing independently °Total Score: 27  °  °Special needs/care consideration   Skin Abrasion: buttocks/Right, Upper; Cracking: buttocks/Mid; Incision: Right abdomen, Behavioral consideration encephalopathic and Designated visitor Annette Zurn, wife  ° °Previous Home Environment (from acute therapy documentation) °Living Arrangements: Spouse/significant other °Available Help at Discharge: Family, Available 24 hours/day °Type of Home: House °Home Layout: Two level, Able to live on main level with bedroom/bathroom °Home Access: Stairs to enter °Entrance Stairs-Number of Steps: 1 °Bathroom Shower/Tub: Walk-in shower °Bathroom Toilet: Handicapped height ° °Discharge Living Setting °Plans for Discharge Living  Setting: Patient's home, House, Lives with (comment) (Lives with wife, Annette) °Type of Home at Discharge: House °Discharge Home Layout: Two level, Full bath on main level, Able to live on main level with bedroom/bathroom °Alternate Level Stairs-Number of Steps: 17-18 steps, but does not have to go upstairs °Discharge Home Access: Stairs to enter °Entrance Stairs-Rails: None °Entrance Stairs-Number of Steps: 1 step entry °Discharge Bathroom Shower/Tub: Walk-in shower, Door °Discharge Bathroom Toilet: Standard °Discharge Bathroom Accessibility: Yes °How Accessible: Accessible via walker °Does the patient have any problems obtaining your medications?: No ° °Social/Family/Support Systems °Patient Roles: Spouse, Parent (Has wife and a 22 yo child.) °Contact Information: Annette Bick - wife - [phone] °Anticipated Caregiver: wife °Ability/Limitations of Caregiver: Wife does not work and can assist and provide supervision after rehab stay °Caregiver Availability: 24/7 °Discharge Plan Discussed with Primary Caregiver: Yes (Met with wife and patient at the bedside.) °Is Caregiver In Agreement with Plan?: Yes °Does Caregiver/Family have Issues with Lodging/Transportation while Pt is in Rehab?: No ° °Goals °Patient/Family Goal for Rehab: PT/OT S/min assist goals °Expected length of stay: 10-14 days °Cultural Considerations: None °Pt/Family Agrees to Admission and willing to participate: Yes °Program Orientation Provided & Reviewed with Pt/Caregiver Including Roles  & Responsibilities: Yes ° °Decrease burden of Care through IP rehab admission: NA ° °Possible need for SNF placement upon discharge: NA ° °Patient Condition: I have reviewed medical records from [facility], spoken with CM, and patient and spouse. I met with patient at the bedside for inpatient rehabilitation assessment.  Patient will benefit from ongoing PT, OT and SLP, can actively participate in 3 hours of therapy a day 5 days of the week, and  can make measurable gains during the admission.  Patient will also benefit from the coordinated team approach during an Inpatient Acute Rehabilitation admission.  The patient will receive intensive therapy as well as Rehabilitation physician, nursing, social worker, and care management interventions.  Due to safety, skin/wound care, disease management, medication administration, pain management and patient education the patient requires 24 hour a day rehabilitation nursing.  The patient is currently Min A 250' with mobility and Min A-Max A +2 with basic ADLs.  Discharge setting and therapy post discharge at home with home health is anticipated.  Patient has agreed to participate in the Acute Inpatient Rehabilitation Program and will admit today. ° °Preadmission Screen Completed By:  Deloris Mittag P Graves Madden, 05/26/2020 2:17 PM °______________________________________________________________________   °Discussed status with Dr. Swartz on 05/26/20 ° at 2:17 PM and received approval for admission today. ° °Admission Coordinator:  Jyaire Koudelka P Graves Madden, CCC-SLP, time 2:17 PM/Date 05/26/20 °  ° °Assessment/Plan: °Diagnosis: debility/encephalopathy after GIB °1. Does the need for close, 24 hr/day Medical supervision in concert with the patient's rehab needs make it unreasonable for this patient to be served in a less intensive setting? Yes °2. Co-Morbidities requiring supervision/potential complications: ABLA, ETOH abuse °3. Due to bladder management, bowel management, safety, skin/wound care, disease management, medication administration, pain management and patient education, does the   at 2:17 PM and received approval for admission today.   Admission Coordinator:  Bethel Born, CCC-SLP, time 2:17 PM/Date 05/26/20     Assessment/Plan: Diagnosis: debility/encephalopathy after GIB 1. Does the need for close, 24 hr/day Medical supervision in concert with the patient's rehab needs make it unreasonable for this patient to be served in a less intensive setting? Yes 2. Co-Morbidities requiring supervision/potential complications: ABLA, ETOH abuse 3. Due to bladder management, bowel management, safety, skin/wound care, disease management, medication administration, pain management and patient education, does the patient require 24 hr/day rehab nursing? Yes 4. Does the patient require coordinated care of a physician, rehab nurse, PT, OT to address physical and functional deficits in the context of the above medical diagnosis(es)? Yes Addressing deficits in the  following areas: balance, endurance, locomotion, strength, transferring, bowel/bladder control, bathing, dressing, feeding, grooming, toileting and psychosocial support 5. Can the patient actively participate in an intensive therapy program of at least 3 hrs of therapy 5 days a week? Yes 6. The potential for patient to make measurable gains while on inpatient rehab is excellent 7. Anticipated functional outcomes upon discharge from inpatient rehab: modified independent and supervision PT, modified independent and supervision OT, n/a SLP 8. Estimated rehab length of stay to reach the above functional goals is: 7-10 days 9. Anticipated discharge destination: Home 10. Overall Rehab/Functional Prognosis: excellent     MD Signature: Meredith Staggers, MD, [facility] 05/26/2020

## 2020-05-26 NOTE — H&P (Addendum)
Physical Medicine and Rehabilitation Admission H&P     CC: debility.      HPI: Jimmy Gonzales is 49 year old male with history of cirrhosis of liver, alcohol abuse who was admitted on via [facility] 05/15/20 with massive hematemesis with hemorrhagic shock and inability to treat bleeding. He required emergent TIPS with embolization of variceal trunks to esophageal varices by Dr. Linard Millers. He was required 2 units PRBC and OGT. He had frank blood via OGT as well as rectal tube as hypotension with bouts of agitation due to hepatic encephalopathy--ammonia level 154. neprology consulted for input on AKI and recommended supportive care.  He tolerated extubation by 08/23 and on regular diet as no signs of dysphagia.     Dr. Cristina Gong following for input and recommended monitoring of H/H as well as PPI bid,  Lactulose, corticosteroids  and Xifaxan. To complete 10 day course of Rocephin for SB  On SBO noted but tolerating po's with liquid stools. He developed abdominal pain due to progressive distension and abdominal ultrasound without significant ascites. He continued to have confusion with leucocytosis--WBC up to 21.9 today, hypokalemia, cognitive deficits as well as unsteady gait with high fall risk.  Required haldol and Seroquel--> Seroquel has been discontinued due to incontinence/wife's concerns. Therapy on going and patient with balance deficits as well as difficulty with higher level cognitive tasks.  Patient with functional deficits and CIR recommended for follow up therapy.        Review of Systems  Constitutional: Negative for chills and fever.  HENT: Negative for hearing loss and tinnitus.   Eyes: Negative for blurred vision and double vision.  Respiratory: Negative for cough and shortness of breath.   Cardiovascular: Negative for chest pain, palpitations and leg swelling.  Gastrointestinal: Negative for abdominal pain, heartburn and nausea.  Genitourinary: Negative for dysuria and urgency.    Musculoskeletal: Positive for joint pain (bilateral hip pain after walking). Negative for myalgias.  Skin: Positive for itching (on back). Negative for rash.  Neurological: Negative for dizziness, sensory change, speech change, weakness and headaches.  Psychiatric/Behavioral: The patient is nervous/anxious. The patient does not have insomnia.           Past Medical History:  Diagnosis Date  . HTN (hypertension)    . Hypothyroid             Past Surgical History:  Procedure Laterality Date  . IR ANGIOGRAM SELECTIVE EACH ADDITIONAL VESSEL   05/16/2020  . IR ANGIOGRAM SELECTIVE EACH ADDITIONAL VESSEL   05/16/2020  . IR ANGIOGRAM SELECTIVE EACH ADDITIONAL VESSEL   05/16/2020  . IR EMBO ART  VEN HEMORR LYMPH EXTRAV  INC GUIDE ROADMAPPING   05/16/2020  . IR TIPS   05/16/2020  . RADIOLOGY WITH ANESTHESIA N/A 05/15/2020    Procedure: TIPS PROCEDURE;  Surgeon: Radiologist, Medication, MD;  Location: [place];  Service: Radiology;  Laterality: N/A;           Family History  Problem Relation Age of Onset  . Lung cancer Mother    . Congestive Heart Failure Father        Social History:  Married. Works in Architect. has an unknown smoking status. His smokeless tobacco use includes chew--one can/day. He reports current alcohol used   1/4 to 1/2 gallon of liquor.  No history on file for drug use.      Allergies: No Known Allergies            Medications Prior  to Admission  Medication Sig Dispense Refill  . bisoprolol-hydrochlorothiazide (ZIAC) 5-6.25 MG tablet Take 1 tablet by mouth daily.      . cloNIDine (CATAPRES) 0.1 MG tablet Take 0.1 mg by mouth 3 (three) times daily.      Marland Kitchen levothyroxine (SYNTHROID) 25 MCG tablet Take 25 mcg by mouth daily.      . Naphazoline-Pheniramine (OPCON-A) 0.027-0.315 % SOLN Place 1 drop into both eyes daily as needed (dry eyes/irritation).      Marland Kitchen omega-3 acid ethyl esters (LOVAZA) 1 g capsule Take 2 capsules by mouth daily.      Marland Kitchen omeprazole (PRILOSEC) 20  MG capsule Take 20 mg by mouth daily.          Drug Regimen Review  Drug regimen was reviewed and remains appropriate with no significant issues identified   Home: Home Living Family/patient expects to be discharged to:: Private residence Living Arrangements: Spouse/significant other Available Help at Discharge: Family, Available 24 hours/day Type of Home: House Home Access: Stairs to enter Technical brewer of Steps: 1 Home Layout: Two level, Able to live on main level with bedroom/bathroom Bathroom Shower/Tub: Multimedia programmer: Handicapped height Home Equipment: None   Functional History: Prior Function Level of Independence: Independent Comments: pt runs a Copywriter, 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 Transfers Overall transfer level: Needs assistance Equipment used: None Transfers: Sit to/from Stand Sit to Stand: Min assist General transfer comment: minA to power up and minA to slow descent Ambulation/Gait Ambulation/Gait assistance: Min assist Gait Distance (Feet): 250 Feet Assistive device: 1 person hand held assist Gait Pattern/deviations: Step-to pattern, Drifts right/left, Wide base of support General Gait Details: pt with shortened step to gait with significantly reduced gait speed. limited arm swing bilaterally, then later excessive arm swing when cued to relax UEs. Pt drifts laterally with multiple minor losses of balance Gait velocity: reduced Gait velocity interpretation: <1.8 ft/sec, indicate of risk for recurrent falls Stairs: Yes Stairs assistance: Min assist, Mod assist Stair Management: No rails Number of Stairs: 2 General stair comments: modA for  one posterior LOB when descending steps   ADL: ADL Overall ADL's : Needs assistance/impaired Grooming: Minimal assistance, Sitting Upper Body Bathing: Minimal assistance, Sitting Lower Body Bathing: Moderate assistance, +2 for physical assistance, +2 for safety/equipment, Sit to/from stand Upper Body Dressing : Minimal assistance, Sitting Lower Body Dressing: Maximal assistance, +2 for physical assistance, +2 for safety/equipment, Sit to/from stand Toilet Transfer: Moderate assistance, +2 for physical assistance, +2 for safety/equipment, Ambulation Toilet Transfer Details (indicate cue type and reason): simulated to recliner  Functional mobility during ADLs: Moderate assistance, +2 for physical assistance, +2 for safety/equipment, Cueing for sequencing, Cueing for safety General ADL Comments: pt limited by impaired cognition, decreased coordination, impaired balance and generalized weakness    Cognition: Cognition Overall Cognitive Status: Impaired/Different from baseline Orientation Level: Oriented X4 Cognition Arousal/Alertness: Awake/alert Behavior During Therapy: Flat affect Overall Cognitive Status: Impaired/Different from baseline Area of Impairment: Attention, Memory, Following commands, Safety/judgement, Awareness, Problem solving Orientation Level: Disoriented to, Time (pt adamant it's friday 8/26, saying phone & calendar is wron) Current Attention Level: Sustained Memory: Decreased recall of precautions, Decreased short-term memory Following Commands: Follows one step commands with increased time Safety/Judgement:  Decreased awareness of safety, Decreased awareness of deficits Awareness: Emergent Problem Solving: Difficulty sequencing, Requires verbal cues, Requires tactile cues General Comments: pt adamant about the date 8/26, Friday despite max directional verbal cues and re-direction.      Blood pressure (!) 143/83, pulse 100, temperature 98.6 F (37 C), temperature  source Oral, resp. rate 18, height 6' (1.829 m), weight 98.7 kg, SpO2 95 %. Physical Exam Vitals and nursing note reviewed.  Constitutional:      Appearance: Normal appearance.  HENT:     Head: Normocephalic and atraumatic.     Right Ear: External ear normal.     Left Ear: External ear normal.     Nose: Nose normal.     Mouth/Throat:     Mouth: Mucous membranes are moist.  Eyes:     General: Scleral icterus present.  Cardiovascular:     Rate and Rhythm: Normal rate and regular rhythm.     Heart sounds: No murmur heard.  No gallop.   Pulmonary:     Effort: Pulmonary effort is normal. No respiratory distress.     Breath sounds: No wheezing or rhonchi.  Abdominal:     General: There is distension.     Tenderness: There is no abdominal tenderness.  Musculoskeletal:        General: No swelling or tenderness.     Cervical back: Normal range of motion.  Skin:    General: Skin is warm and dry.     Coloration: Skin is jaundiced.     Comments: Macular/papular rash on the back.  Neurological:     Mental Status: He is alert and oriented to person, place, and time.     Cranial Nerves: No cranial nerve deficit.     Sensory: No sensory deficit.     Comments: Reasonable insight and awareness. Speech clear. Normal language. Functional memory. UE 5/5 prox to distal. LE: 4-/5 HF, 4/5 KE and 5/5 ADF/PF. No sensory deficits.   Psychiatric:        Mood and Affect: Mood normal.        Behavior: Behavior normal.        Thought Content: Thought content normal.        Judgment: Judgment normal.        Lab Results Last 48 Hours        Results for orders placed or performed during the hospital encounter of 05/15/20 (from the past 48 hour(s))  CBC     Status: Abnormal    Collection Time: 05/25/20  3:28 AM  Result Value Ref Range    WBC 20.7 (H) 4.0 - 10.5 K/uL    RBC 3.05 (L) 4.22 - 5.81 MIL/uL    Hemoglobin 9.2 (L) 13.0 - 17.0 g/dL    HCT 28.7 (L) 39 - 52 %    MCV 94.1 80.0 - 100.0 fL     MCH 30.2 26.0 - 34.0 pg    MCHC 32.1 30.0 - 36.0 g/dL    RDW 18.7 (H) 11.5 - 15.5 %    Platelets 251 150 - 400 K/uL    nRBC 0.0 0.0 - 0.2 %      Comment: Performed at [facility], [address], [place] [address]  CBC     Status: Abnormal    Collection Time: 05/26/20  2:09 AM  Result Value Ref Range    WBC 21.9 (H) 4.0 - 10.5 K/uL    RBC 3.12 (L) 4.22 - 5.81 MIL/uL  Hemoglobin 9.4 (L) 13.0 - 17.0 g/dL    HCT 29.3 (L) 39 - 52 %    MCV 93.9 80.0 - 100.0 fL    MCH 30.1 26.0 - 34.0 pg    MCHC 32.1 30.0 - 36.0 g/dL    RDW 19.0 (H) 11.5 - 15.5 %    Platelets 257 150 - 400 K/uL    nRBC 0.0 0.0 - 0.2 %      Comment: Performed at [facility] [address], [place], [place] [address]  Comprehensive metabolic panel     Status: Abnormal    Collection Time: 05/26/20  2:09 AM  Result Value Ref Range    Sodium 140 135 - 145 mmol/L    Potassium 3.5 3.5 - 5.1 mmol/L    Chloride 102 98 - 111 mmol/L    CO2 25 22 - 32 mmol/L    Glucose, Bld 99 70 - 99 mg/dL      Comment: Glucose reference range applies only to samples taken after fasting for at least 8 hours.    BUN 8 6 - 20 mg/dL    Creatinine, Ser 0.70 0.61 - 1.24 mg/dL    Calcium 8.7 (L) 8.9 - 10.3 mg/dL    Total Protein 6.4 (L) 6.5 - 8.1 g/dL    Albumin 2.5 (L) 3.5 - 5.0 g/dL    AST 125 (H) 15 - 41 U/L    ALT 79 (H) 0 - 44 U/L    Alkaline Phosphatase 192 (H) 38 - 126 U/L    Total Bilirubin 4.0 (H) 0.3 - 1.2 mg/dL    GFR calc non Af Amer >60 >60 mL/min    GFR calc Af Amer >60 >60 mL/min    Anion gap 13 5 - 15      Comment: Performed at [facility] [address], [place], [place] [address]      Imaging Results (Last 48 hours)  No results found.           Medical Problem List and Plan: 1.  Functional and mobility deficits secondary to hemorrhagic shock d/t esophageal bleed/cirrhosis of liver s/p embolization, TIPS procedure with associated hepatic encephalopathy             -patient may shower              -ELOS/Goals: 7-10 days, supervision to mod I for PT, OT, SLP 2.  Antithrombotics: -DVT/anticoagulation:  Mechanical: Sequential compression devices, below knee Bilateral lower extremities             -antiplatelet therapy: N/A 3. Pain Management: Oxycodone prn?--may need to d/c if confusion continues.  4. Mood: LCSW to follow for evaluation and support.              -antipsychotic agents: N/A 5. Neuropsych: This patient Is not fully capable of making decisions on his own behalf but he's approaching baseline.             -cognition has improved immensely .  6. Rash//Skin/Wound Care: Monitor I/O. Routine pressure relief measures. Will add Sarna to help manage symptoms.  7. Fluids/Electrolytes/Nutrition: Monitor I/O. Check lytes in am.  8. ABLA/GIB s/p TIPS: Continue to monitor H/H with serial checks. Transfuse prn Hgb< 7.0.  9. Decompensated Cirrhosis/Hepatic encephalopathy: Ammonia level 132-->recheck in am.              -Continue PPI bid             - lactulose 20 gram  bid             -Rifaximin. Has completed 10/10 days Rocephin on 08/24.               -To continue prednisone 40 mg daily X 28 days then d/c per GI.  10. Hypokalemia: Multifactorial. Resolved with runs of K+---will add Kdur 20 meq daily for supplement as starting to trend down again--recheck labs in am.  11. HTN: BP trending up-->Norvasc added 08/25. Clonidine resumed today.  12. Leucocytosis: Likely due to steroids -->monitor for signs of infection.            Bary Leriche, PA-C 05/26/2020  I have personally performed a face to face diagnostic evaluation of this patient and formulated the key components of the plan.  Additionally, I have personally reviewed laboratory data, imaging studies, as well as relevant notes and concur with the physician assistant's documentation above.  The patient's status has not changed from the original H&P.  Any changes in documentation from the acute care chart have been noted  above.  Meredith Staggers, MD, [facility]rown

## 2020-05-26 NOTE — Progress Notes (Signed)
Pt was seen for final acute therapy with stairs and hallway walking done.  Follow up with CIR therapy to finish balance training, mainly needing to work on lateral instability with gait.     05/26/20 2100  PT Visit Information  Last PT Received On 05/26/20  Assistance Needed +1  History of Present Illness 49 year old male with a past medical history significant for heavy alcohol abuse presented to [facility] with hematemesis, developed hemorrhagic shock, GI unable to control bleeding with endoscopy.  Transferred to [facility] for emergent TIPS placement.  Subjective Data  Subjective reports he is ready to get up and walk  Patient Stated Goal to go to rehab  Precautions  Precautions Fall  Restrictions  Weight Bearing Restrictions No  Pain Assessment  Pain Assessment No/denies pain  Cognition  Arousal/Alertness Awake/alert  Behavior During Therapy Flat affect  Overall Cognitive Status Impaired/Different from baseline  Area of Impairment Problem solving;Awareness;Safety/judgement;Memory  Orientation Level Time  Current Attention Level Selective  Memory Decreased short-term memory  Following Commands Follows one step commands inconsistently;Follows one step commands with increased time  Safety/Judgement Decreased awareness of safety  Awareness Intellectual  Problem Solving Requires verbal cues  Bed Mobility  General bed mobility comments OOB when PT arrives  Transfers  Overall transfer level Modified independent  Ambulation/Gait  Ambulation/Gait assistance Min guard  Gait Distance (Feet) 450 Feet  Assistive device 1 person hand held assist  Gait Pattern/deviations Step-through pattern;Decreased stride length;Wide base of support  General Gait Details shorter steps but no LOB  Gait velocity interpretation <1.31 ft/sec, indicative of household ambulator  Stairs Yes  Stairs assistance Min guard  Stair Management Two rails;Alternating pattern  Number of Stairs 10   Balance  Overall balance assessment Needs assistance  Sitting-balance support Feet supported  Sitting balance-Leahy Scale Fair  Standing balance support No upper extremity supported  Standing balance-Leahy Scale Fair  General Comments  General comments (skin integrity, edema, etc.) pt is observed to manage walking with no AD and with stairs cued only   PT - End of Session  Activity Tolerance Patient tolerated treatment well  Patient left in chair;with call bell/phone within reach;with chair alarm set;with family/visitor present  Nurse Communication Mobility status   PT - Assessment/Plan  PT Plan Current plan remains appropriate  PT Visit Diagnosis Unsteadiness on feet (R26.81);Difficulty in walking, not elsewhere classified (R26.2)  PT Frequency (ACUTE ONLY) Min 4X/week  Recommendations for Other Services Rehab consult  Follow Up Recommendations CIR  PT equipment Rolling walker with 5" wheels  AM-PAC PT "6 Clicks" Mobility Outcome Measure (Version 2)  Help needed turning from your back to your side while in a flat bed without using bedrails? 4  Help needed moving from lying on your back to sitting on the side of a flat bed without using bedrails? 4  Help needed moving to and from a bed to a chair (including a wheelchair)? 4  Help needed standing up from a chair using your arms (e.g., wheelchair or bedside chair)? 3  Help needed to walk in hospital room? 3  Help needed climbing 3-5 steps with a railing?  3  6 Click Score 21  Consider Recommendation of Discharge To: Home with no services  PT Goal Progression  Progress towards PT goals Progressing toward goals  PT Time Calculation  PT Start Time (ACUTE ONLY) 1559  PT Stop Time (ACUTE ONLY) 1622  PT Time Calculation (min) (ACUTE ONLY) 23 min  PT Treatments  $Gait Training 23-37  mins    Mee Hives, PT MS Acute Rehab Dept. Number: [place] [facility] [place]

## 2020-05-27 ENCOUNTER — Inpatient Hospital Stay (HOSPITAL_COMMUNITY): Payer: BC Managed Care – PPO | Admitting: Occupational Therapy

## 2020-05-27 ENCOUNTER — Inpatient Hospital Stay (HOSPITAL_COMMUNITY): Payer: BC Managed Care – PPO | Admitting: Speech Pathology

## 2020-05-27 ENCOUNTER — Inpatient Hospital Stay (HOSPITAL_COMMUNITY): Payer: BC Managed Care – PPO | Admitting: Physical Therapy

## 2020-05-27 DIAGNOSIS — R5381 Other malaise: Secondary | ICD-10-CM

## 2020-05-27 DIAGNOSIS — K7201 Acute and subacute hepatic failure with coma: Secondary | ICD-10-CM

## 2020-05-27 LAB — COMPREHENSIVE METABOLIC PANEL
ALT: 89 U/L — ABNORMAL HIGH (ref 0–44)
AST: 126 U/L — ABNORMAL HIGH (ref 15–41)
Albumin: 2.6 g/dL — ABNORMAL LOW (ref 3.5–5.0)
Alkaline Phosphatase: 208 U/L — ABNORMAL HIGH (ref 38–126)
Anion gap: 11 (ref 5–15)
BUN: 7 mg/dL (ref 6–20)
CO2: 26 mmol/L (ref 22–32)
Calcium: 8.8 mg/dL — ABNORMAL LOW (ref 8.9–10.3)
Chloride: 101 mmol/L (ref 98–111)
Creatinine, Ser: 0.62 mg/dL (ref 0.61–1.24)
GFR calc Af Amer: >60 mL/min (ref >60)
GFR calc non Af Amer: >60 mL/min (ref >60)
Glucose, Bld: 125 mg/dL — ABNORMAL HIGH (ref 70–99)
Potassium: 3.8 mmol/L (ref 3.5–5.1)
Sodium: 138 mmol/L (ref 135–145)
Total Bilirubin: 4.5 mg/dL — ABNORMAL HIGH (ref 0.3–1.2)
Total Protein: 6.6 g/dL (ref 6.5–8.1)

## 2020-05-27 LAB — CBC
HCT: 31.9 % — ABNORMAL LOW (ref 39.0–52.0)
Hemoglobin: 10.4 g/dL — ABNORMAL LOW (ref 13.0–17.0)
MCH: 30.9 pg (ref 26.0–34.0)
MCHC: 32.6 g/dL (ref 30.0–36.0)
MCV: 94.7 fL (ref 80.0–100.0)
Platelets: 349 10*3/uL (ref 150–400)
RBC: 3.37 MIL/uL — ABNORMAL LOW (ref 4.22–5.81)
RDW: 19.3 % — ABNORMAL HIGH (ref 11.5–15.5)
WBC: 25.5 10*3/uL — ABNORMAL HIGH (ref 4.0–10.5)
nRBC: 0 % (ref 0.0–0.2)

## 2020-05-27 LAB — CBC WITH DIFFERENTIAL/PLATELET
Abs Immature Granulocytes: 0.18 10*3/uL — ABNORMAL HIGH (ref 0.00–0.07)
Basophils Absolute: 0 10*3/uL (ref 0.0–0.1)
Basophils Relative: 0 %
Eosinophils Absolute: 0 10*3/uL (ref 0.0–0.5)
Eosinophils Relative: 0 %
HCT: 28.7 % — ABNORMAL LOW (ref 39.0–52.0)
Hemoglobin: 9.1 g/dL — ABNORMAL LOW (ref 13.0–17.0)
Immature Granulocytes: 1 %
Lymphocytes Relative: 5 %
Lymphs Abs: 1.1 10*3/uL (ref 0.7–4.0)
MCH: 29.6 pg (ref 26.0–34.0)
MCHC: 31.7 g/dL (ref 30.0–36.0)
MCV: 93.5 fL (ref 80.0–100.0)
Monocytes Absolute: 0.7 10*3/uL (ref 0.1–1.0)
Monocytes Relative: 4 %
Neutro Abs: 19.3 10*3/uL — ABNORMAL HIGH (ref 1.7–7.7)
Neutrophils Relative %: 90 %
Platelets: 237 10*3/uL (ref 150–400)
RBC: 3.07 MIL/uL — ABNORMAL LOW (ref 4.22–5.81)
RDW: 19.3 % — ABNORMAL HIGH (ref 11.5–15.5)
WBC: 21.4 10*3/uL — ABNORMAL HIGH (ref 4.0–10.5)
nRBC: 0 % (ref 0.0–0.2)

## 2020-05-27 MED ORDER — RIFAXIMIN 550 MG PO TABS
550.0000 mg | ORAL_TABLET | Freq: Two times a day (BID) | ORAL | Status: DC
  Administered 2020-05-27 – 2020-05-29 (×5): 550 mg via ORAL
  Filled 2020-05-27 (×6): qty 1

## 2020-05-27 MED ORDER — PANTOPRAZOLE SODIUM 40 MG PO TBEC
40.0000 mg | DELAYED_RELEASE_TABLET | Freq: Two times a day (BID) | ORAL | Status: DC
  Administered 2020-05-27 – 2020-05-29 (×5): 40 mg via ORAL
  Filled 2020-05-27 (×5): qty 1

## 2020-05-27 MED ORDER — PANTOPRAZOLE SODIUM 40 MG PO TBEC: 40.0000 mg | DELAYED_RELEASE_TABLET | Freq: Every day | ORAL | Status: DC

## 2020-05-27 MED ORDER — NAPHAZOLINE-PHENIRAMINE 0.025-0.3 % OP SOLN
1.0000 [drp] | Freq: Every day | OPHTHALMIC | Status: DC | PRN
  Filled 2020-05-27: qty 15

## 2020-05-27 MED ORDER — CHLORHEXIDINE GLUCONATE CLOTH 2 % EX PADS: 6.0000 | MEDICATED_PAD | Freq: Every day | CUTANEOUS | Status: DC

## 2020-05-27 MED ORDER — LEVOTHYROXINE SODIUM 25 MCG PO TABS
25.0000 ug | ORAL_TABLET | Freq: Every day | ORAL | Status: DC
  Administered 2020-05-27 – 2020-05-29 (×3): 25 ug via ORAL
  Filled 2020-05-27 (×3): qty 1

## 2020-05-27 MED ORDER — THIAMINE HCL 100 MG PO TABS
100.0000 mg | ORAL_TABLET | Freq: Every day | ORAL | Status: DC
  Administered 2020-05-27 – 2020-05-29 (×3): 100 mg via ORAL
  Filled 2020-05-27 (×3): qty 1

## 2020-05-27 MED ORDER — LACTULOSE 10 GM/15ML PO SOLN
20.0000 g | Freq: Two times a day (BID) | ORAL | Status: DC
  Administered 2020-05-27 – 2020-05-29 (×5): 20 g via ORAL
  Filled 2020-05-27 (×5): qty 30

## 2020-05-27 MED ORDER — AMLODIPINE BESYLATE 5 MG PO TABS
5.0000 mg | ORAL_TABLET | Freq: Every day | ORAL | Status: DC
  Administered 2020-05-27 – 2020-05-29 (×3): 5 mg via ORAL
  Filled 2020-05-27 (×3): qty 1

## 2020-05-27 MED ORDER — ENSURE ENLIVE PO LIQD
237.0000 mL | Freq: Two times a day (BID) | ORAL | Status: DC
  Administered 2020-05-27 – 2020-05-28 (×4): 237 mL via ORAL

## 2020-05-27 MED ORDER — SODIUM CHLORIDE 0.9 % IV SOLN
2.0000 g | INTRAVENOUS | Status: DC
  Filled 2020-05-27 (×2): qty 20

## 2020-05-27 MED ORDER — FLEET ENEMA 7-19 GM/118ML RE ENEM: 1.0000 | ENEMA | Freq: Every day | RECTAL | Status: DC | PRN

## 2020-05-27 MED ORDER — PREDNISOLONE 5 MG PO TABS
40.0000 mg | ORAL_TABLET | Freq: Every day | ORAL | Status: DC
  Administered 2020-05-27 – 2020-05-29 (×3): 40 mg via ORAL
  Filled 2020-05-27 (×3): qty 8

## 2020-05-27 NOTE — Progress Notes (Signed)
Patient ID: Jimmy Gonzales, male   DOB: [birth year], 49 y.o.   MRN: [id number] Met with the patient and wife to review role of the nurse CM and nursing concerns; hepatic encephalopathy, cirrhosis, dietary supplements; thiamine and magnesium. Reported concerns about potassium level; supplements and IV abx. Per MD note, looks like he has three more doses. Sarna lotion ordered for rash on his back and nursing to obtain cotton sheets. Reports trouble sleeping due to itching and hospital gown not comfortable with rash. Confirmed PCP in computer and primary pharmacy used. No other concerns noted at present. Margarito Liner

## 2020-05-27 NOTE — Progress Notes (Signed)
Pt continues to have periods of confusion at times, had to redirect him a few times. He is now in bed, bed alarm is on.   Bertram Millard LPN

## 2020-05-27 NOTE — Progress Notes (Signed)
[facility] Individual Statement of Services  Patient Name:  Jimmy Gonzales  Date:  05/27/2020  Welcome to the [facility].  Our goal is to provide you with an individualized program based on your diagnosis and situation, designed to meet your specific needs.  With this comprehensive rehabilitation program, you will be expected to participate in at least 3 hours of rehabilitation therapies Monday-Friday, with modified therapy programming on the weekends.  Your rehabilitation program will include the following services:  Physical Therapy (PT), Occupational Therapy (OT), Speech Therapy (ST), 24 hour per day rehabilitation nursing, Therapeutic Recreaction (TR), Neuropsychology, Care Coordinator, Rehabilitation Medicine, Nutrition Services, Pharmacy Services and Other  Weekly team conferences will be held on Wednesday to discuss your progress.  Your Inpatient Rehabilitation Care Coordinator will talk with you frequently to get your input and to update you on team discussions.  Team conferences with you and your family in attendance may also be held.  Expected length of stay: 10-14 Days  Overall anticipated outcome: Min A  Depending on your progress and recovery, your program may change. Your Inpatient Rehabilitation Care Coordinator will coordinate services and will keep you informed of any changes. Your Inpatient Rehabilitation Care Coordinator's name and contact numbers are listed  below.  The following services may also be recommended but are not provided by the [facility]:    [facility] will be made to provide these services after discharge if needed.  Arrangements include referral to agencies that provide these services.  Your insurance has been verified to be:  [employer] Your primary doctor is:  Jenean Lindau, MD  Pertinent  information will be shared with your doctor and your insurance company.  Inpatient Rehabilitation Care Coordinator:  Erlene Quan, [place] or [phone]  Information discussed with and copy given to patient by: Dyanne Iha, 05/27/2020, 10:57 AM

## 2020-05-27 NOTE — Progress Notes (Signed)
Inpatient Rehabilitation  Patient information reviewed and entered into eRehab system by Nysa Sarin M. Kamaya Keckler, M.A., CCC/SLP, PPS Coordinator.  Information including medical coding, functional ability and quality indicators will be reviewed and updated through discharge.    

## 2020-05-27 NOTE — Evaluation (Addendum)
Speech Language Pathology Assessment and Plan  Patient Details  Name: Jimmy Gonzales MRN: [id number] Date of Birth: Jan 30, 1971  SLP Diagnosis: Cognitive Impairments  Rehab Potential: Excellent ELOS: 3-5 days    Today's Date: 05/27/2020 SLP Individual Time: 1255-1400 SLP Individual Time Calculation (min): 65 min   Hospital Problem: Active Problems:   Hepatic encephalopathy [facility])  Past Medical History:  Past Medical History:  Diagnosis Date  . HTN (hypertension)   . Hypothyroid    Past Surgical History:  Past Surgical History:  Procedure Laterality Date  . IR ANGIOGRAM SELECTIVE EACH ADDITIONAL VESSEL  05/16/2020  . IR ANGIOGRAM SELECTIVE EACH ADDITIONAL VESSEL  05/16/2020  . IR ANGIOGRAM SELECTIVE EACH ADDITIONAL VESSEL  05/16/2020  . IR EMBO ART  VEN HEMORR LYMPH EXTRAV  INC GUIDE ROADMAPPING  05/16/2020  . IR TIPS  05/16/2020  . RADIOLOGY WITH ANESTHESIA N/A 05/15/2020   Procedure: TIPS PROCEDURE;  Surgeon: Radiologist, Medication, MD;  Location: [facility];  Service: Radiology;  Laterality: N/A;    Assessment / Plan / Recommendation Clinical Impression Patient is a 49 year old male with history of cirrhosis of liver, alcohol abuse who was admitted via [facility] 05/15/20 with massive hematemesis with hemorrhagic shock and inability to treat bleeding. He required emergent TIPS with embolization of variceal trunks to esophageal varices by Dr. Linard Millers. He was required 2 units PRBC and OGT. He had frank blood via OGT as well as rectal tube as hypotension with bouts of agitation due to hepatic encephalopathy--ammonia level 154. neprology consulted for input on AKI and recommended supportive care. He tolerated extubation by 08/23 and on regular diet as no signs of dysphagia. Dr. Cristina Gong following for input and recommended monitoring of H/H as well as PPI bid, Lactulose, corticosteroids and Xifaxan. To complete 10 day course of Rocephin for SB On SBO noted but tolerating po's with liquid stools. He  developed abdominal pain due to progressive distension and abdominal ultrasound without significant ascites.He continuedto have confusion with leucocytosis--WBC up to 21.9today, hypokalemia, cognitive deficits as well as unsteady gait with high fall risk. Required haldol and Seroquel-->Seroquel has been discontinued due to incontinence/wife's concerns. Therapy on going and patient with balance deficits as well as difficulty with higher level cognitive tasks. Patient with functional deficits and CIR recommendedfor follow up therapy.Patient admitted 05/26/20.  Patient was administered the Cognistat and scored WFL on all subtests. However, mild higher level cognitive deficits were noted in divided attention, short-term recall and organization with functional tasks. Due to patient's previous level of independence and cognitive functioning, recommend skilled SLP intervention to maximize his cognitive functioning and overall functional independence prior to discharge.    Skilled Therapeutic Interventions          Administered a cognitive-linguistic evaluation, please see above for details. SLP also facilitated session by providing a mildly complex money management task that patient completed with overall supervision level verbal cues to self-monitor and correct errors with organization of information. Educated patient and his wife in regards to current cognitive deficits and goals of skilled SLP intervention, both verbalized understanding and agreement.    SLP Assessment  Patient will need skilled [place] during CIR admission    Recommendations  Oral Care Recommendations: Oral care BID Recommendations for Other Services: Neuropsych consult Patient destination: Home Follow up Recommendations: None Equipment Recommended: None recommended by SLP    SLP Frequency 3 to 5 out of 7 days   SLP Duration  SLP Intensity  SLP Treatment/Interventions 3-5 days  Minumum of 1-2  x/day, 30 to 90 minutes  Cognitive remediation/compensation;Cueing hierarchy;Environmental controls;Therapeutic Activities;Functional tasks;Patient/family education;Internal/external aids    Pain Pain Assessment Pain Scale: 0-10 Pain Score: 0-No pain  Prior Functioning Type of Home: House  Lives With: Spouse Available Help at Discharge: Family;Available 24 hours/day Vocation: Full time employment  SLP Evaluation Cognition Overall Cognitive Status: Impaired/Different from baseline Arousal/Alertness: Awake/alert Orientation Level: Oriented X4 Attention: Focused;Sustained Focused Attention: Appears intact Sustained Attention: Appears intact Divided Attention: Impaired Divided Attention Impairment: Functional complex Memory: Impaired Memory Impairment: Decreased short term memory Decreased Short Term Memory: Verbal complex Immediate Memory Recall: Sock;Blue;Bed Memory Recall Sock: Without Cue Memory Recall Blue: Without Cue Memory Recall Bed: Not able to recall Awareness: Appears intact Problem Solving: Appears intact Executive Function: Organizing Sequencing: Appears intact Organizing: Impaired Organizing Impairment: Functional complex Decision Making: Appears intact Initiating: Appears intact Safety/Judgment: Appears intact  Comprehension Auditory Comprehension Overall Auditory Comprehension: Appears within functional limits for tasks assessed Expression Expression Primary Mode of Expression: Verbal Verbal Expression Overall Verbal Expression: Appears within functional limits for tasks assessed Written Expression Dominant Hand: Right Written Expression: Not tested Oral Motor Oral Motor/Sensory Function Overall Oral Motor/Sensory Function: Within functional limits Motor Speech Overall Motor Speech: Appears within functional limits for tasks assessed  Care Tool Care Tool Cognition Expression of Ideas and Wants Expression of Ideas and Wants: Without difficulty  (complex and basic) - expresses complex messages without difficulty and with speech that is clear and easy to understand   Understanding Verbal and Non-Verbal Content Understanding Verbal and Non-Verbal Content: Understands (complex and basic) - clear comprehension without cues or repetitions   Memory/Recall Ability *first 3 days only Memory/Recall Ability *first 3 days only: Current season;Location of own room;Staff names and faces;That he or she is in a hospital/hospital unit    Short Term Goals: Week 1: SLP Short Term Goal 1 (Week 1): STGs=LTGs due to ELOS  Refer to Care Plan for Long Term Goals  Recommendations for other services: Neuropsych  Discharge Criteria: Patient will be discharged from SLP if patient refuses treatment 3 consecutive times without medical reason, if treatment goals not met, if there is a change in medical status, if patient makes no progress towards goals or if patient is discharged from hospital.  The above assessment, treatment plan, treatment alternatives and goals were discussed and mutually agreed upon: by patient and by family  Pasha Gadison 05/27/2020, 2:16 PM

## 2020-05-27 NOTE — Progress Notes (Signed)
Inpatient Rehabilitation Medication Review by a Pharmacist  A complete drug regimen review was completed for this patient to identify any potential clinically significant medication issues.  Clinically significant medication issues were identified:  no   Pharmacist comments:  Inpatient medication orders are appropriate based on review of medications patient receiving prior to admission to CIR.   Two medications prior to admission,  bisoprolol-HCT (Ziac) 5-6.25mg  daily and clonidine 0.1mg  po TID which patient was taking at home. However, his therapy was changed to amlodipine by physician on 05/22/20 on the hospital admission prior to this inpatient rehab admission 05/26/20.  Amlodipine appropriately resumed on inpatient rehab admission.    Time spent performing this drug regimen review (minutes):  20 minutes   Nicole Cella, [place] Clinical Pharmacist  05/27/2020 12:30 PM

## 2020-05-27 NOTE — Evaluation (Addendum)
Occupational Therapy Assessment and Plan  Patient Details  Name: Jimmy Gonzales MRN: [id number] Date of Birth: [birth year]  OT Diagnosis: cognitive deficits and muscle weakness (generalized) Rehab Potential: Rehab Potential (ACUTE ONLY): Excellent ELOS: 1-3 days   Today's Date: 05/27/2020 OT Individual Time: 8416-6063 OT Individual Time Calculation (min): 50 min     Hospital Problem: Active Problems:   Hepatic encephalopathy [facility])   Past Medical History:  Past Medical History:  Diagnosis Date  . HTN (hypertension)   . Hypothyroid    Past Surgical History:  Past Surgical History:  Procedure Laterality Date  . IR ANGIOGRAM SELECTIVE EACH ADDITIONAL VESSEL  05/16/2020  . IR ANGIOGRAM SELECTIVE EACH ADDITIONAL VESSEL  05/16/2020  . IR ANGIOGRAM SELECTIVE EACH ADDITIONAL VESSEL  05/16/2020  . IR EMBO ART  VEN HEMORR LYMPH EXTRAV  INC GUIDE ROADMAPPING  05/16/2020  . IR TIPS  05/16/2020  . RADIOLOGY WITH ANESTHESIA N/A 05/15/2020   Procedure: TIPS PROCEDURE;  Surgeon: Radiologist, Medication, MD;  Location: [place] [facility];  Service: Radiology;  Laterality: N/A;    Assessment & Plan Clinical Impression: Patient is a 49 y.o. year old male with recent admission to the hospital with history of cirrhosis of liver, alcohol abuse who was admitted on via [facility] 05/15/20 with massive hematemesis with hemorrhagic shock and inability to treat bleeding. He required emergent TIPS with embolization of variceal trunks to esophageal varices by Dr. Linard Millers. He was required 2 units PRBC and OGT. He had frank blood via OGT as well as rectal tube as hypotension with bouts of agitation due to hepatic encephalopathy--ammonia level 154. neprology consulted for input on AKI and recommended supportive care. He tolerated extubation by 08/23 and on regular diet as no signs of dysphagia.   Dr. Cristina Gong following for input and recommended monitoring of H/H as well as PPI bid, Lactulose, corticosteroids and Xifaxan. To complete  10 day course of Rocephin for SB On SBO noted but tolerating po's with liquid stools. He developed abdominal pain due to progressive distension and abdominal ultrasound without significant ascites.He continuedto have confusion with leucocytosis--WBC up to 21.9today, hypokalemia, cognitive deficits as well as unsteady gait with high fall risk. Required haldol and Seroquel-->Seroquel has been discontinued due to incontinence/wife's concerns. Therapy on going and patient with balance deficits as well as difficulty with higher level cognitive tasks..  Patient transferred to CIR on 05/26/2020 .    Patient currently requires supervision with basic self-care skills secondary to decreased memory and decreased standing balance.  Prior to hospitalization, patient could complete ADLs and IADLs with independent .  Patient will benefit from skilled intervention to increase independence with basic self-care skills and increase level of independence with iADL prior to discharge home with care partner.  Anticipate patient will require intermittent supervision and no further OT follow recommended.  OT - End of Session Activity Tolerance: Tolerates 30+ min activity without fatigue Endurance Deficit: No OT Assessment Rehab Potential (ACUTE ONLY): Excellent OT Patient demonstrates impairments in the following area(s): Balance;Cognition OT Basic ADL's Functional Problem(s): Bathing OT Advanced ADL's Functional Problem(s): Simple Meal Preparation OT Transfers Functional Problem(s): Tub/Shower OT Plan OT Intensity: Minimum of 1-2 x/day, 45 to 90 minutes OT Frequency: 5 out of 7 days OT Duration/Estimated Length of Stay: 1-3 days OT Treatment/Interventions: Balance/vestibular training;Patient/family education;Self Care/advanced ADL retraining;Therapeutic Exercise;Cognitive remediation/compensation;Discharge planning;DME/adaptive equipment instruction;Functional mobility training;Therapeutic Activities OT Basic  Self-Care Anticipated Outcome(s): independent OT Toileting Anticipated Outcome(s): independent OT Bathroom Transfers Anticipated Outcome(s): independent OT Recommendation Patient destination: Home Follow Up  Recommendations: None Equipment Recommended: To be determined   OT Evaluation Precautions/Restrictions  Precautions Precautions: Fall Restrictions Weight Bearing Restrictions: No General Chart Reviewed: Yes Pain Pain Assessment Pain Scale: 0-10 Pain Score: 0-No pain Home Living/Prior Functioning Home Living Family/patient expects to be discharged to:: Private residence Living Arrangements: Spouse/significant other Available Help at Discharge: Family, Available 24 hours/day Type of Home: House Home Access: Stairs to enter [employer] of Steps: 1 (8-10inches) Home Layout: Two level, Able to live on main level with bedroom/bathroom Bathroom Shower/Tub: Multimedia programmer: Handicapped height  Lives With: Spouse Prior Function Level of Independence: Independent with basic ADLs, Independent with homemaking with ambulation, Independent with gait, Independent with transfers  Able to Take Stairs?: Yes Driving: Yes Vocation: Full time employment Comments: pt runs a Copywriter, 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, works in the office Vision Baseline Vision/History: No visual deficits Patient Visual Report: No change from baseline Vision Assessment?: No apparent visual deficits Perception  Perception: Within Functional Limits Praxis Praxis: Intact Cognition Overall Cognitive Status: Impaired/Different from baseline Arousal/Alertness: Awake/alert Orientation Level: Person;Place;Situation Person: Oriented Place: Oriented Situation: Oriented Year: 2021 Month: August Day of Week: Other (Comment) (initially said [place] then immediately corrected and said tuesday) Memory: Impaired Memory Impairment: Decreased short term memory Decreased Short Term Memory:  Verbal complex Immediate Memory Recall: Sock;Blue;Bed Memory Recall Sock: Without Cue Memory Recall Blue: Without Cue Memory Recall Bed: Not able to recall Attention: Focused;Sustained Focused Attention: Appears intact Sustained Attention: Appears intact Divided Attention: Impaired Divided Attention Impairment: Functional complex Awareness: Appears intact Problem Solving: Appears intact Executive Function: Organizing Sequencing: Appears intact Organizing: Impaired Organizing Impairment: Functional complex Decision Making: Appears intact Initiating: Appears intact Safety/Judgment: Appears intact Sensation Sensation Light Touch: Appears Intact Hot/Cold: Not tested Proprioception: Appears Intact Stereognosis: Not tested Coordination Gross Motor Movements are Fluid and Coordinated: Yes  Fine Motor Movements are Fluid and Coordinated: Yes Coordination and Movement Description: During BADLs Motor  Motor Motor: Within Functional Limits  Trunk/Postural Assessment  Cervical Assessment Cervical Assessment: Within Functional Limits Thoracic Assessment Thoracic Assessment: Within Functional Limits Lumbar Assessment Lumbar Assessment: Within Functional Limits Postural Control Postural Control: Within Functional Limits  Balance Static Sitting Balance Static Sitting - Level of Assistance: 7: Independent Dynamic Sitting Balance Dynamic Sitting - Level of Assistance: 7: Independent Static Standing Balance Static Standing - Level of Assistance: 7: Independent Dynamic Standing Balance Dynamic Standing - Level of Assistance: 5: Stand by assistance Extremity/Trunk Assessment RUE Assessment RUE Assessment: Within Functional Limits LUE Assessment LUE Assessment: Within Functional Limits  Care Tool Care Tool Self Care Eating   Eating Assist Level: Independent    Oral Care    Oral Care Assist Level: Independent    Bathing   Body parts bathed by patient: Right arm;Right upper  leg;Left arm;Left upper leg;Chest;Right lower leg;Abdomen;Front perineal area;Left lower leg;Buttocks;Face     Assist Level: Contact Guard/Touching assist    Upper Body Dressing(including orthotics)   What is the patient wearing?: Pull over shirt   Assist Level: Independent    Lower Body Dressing (excluding footwear)     Assist for lower body dressing: Independent    Putting on/Taking off footwear   What is the patient wearing?: Socks;Shoes Assist for footwear: Independent       Care Tool Toileting Toileting activity   Assist for toileting: Supervision/Verbal cueing     Care Tool Bed Mobility Roll left and right activity        Sit to lying activity  Lying to sitting edge of bed activity         Care Tool Transfers Sit to stand transfer        Chair/bed transfer         Toilet transfer   Assist Level: Supervision/Verbal cueing     Care Tool Cognition Expression of Ideas and Wants Expression of Ideas and Wants: Without difficulty (complex and basic) - expresses complex messages without difficulty and with speech that is clear and easy to understand   Understanding Verbal and Non-Verbal Content Understanding Verbal and Non-Verbal Content: Understands (complex and basic) - clear comprehension without cues or repetitions   Memory/Recall Ability *first 3 days only Memory/Recall Ability *first 3 days only: Current season;Location of own room;Staff names and faces;That he or she is in a hospital/hospital unit    Refer to Care Plan for [place] 1 OT Short Term Goal 1 (Week 1): STGs=LTGs due to ELOS  Recommendations for other services: None    Skilled Therapeutic Intervention ADL ADL Eating: Independent Where Assessed-Eating: Chair Grooming: Independent Where Assessed-Grooming: Standing at sink Upper Body Bathing: Independent Where Assessed-Upper Body Bathing: Shower Lower Body Bathing: Supervision/safety Where  Assessed-Lower Body Bathing: Shower Upper Body Dressing: Independent Where Assessed-Upper Body Dressing: Chair Lower Body Dressing: Independent Where Assessed-Lower Body Dressing: Chair Toileting: Supervision/safety Where Assessed-Toileting: Glass blower/designer: Programmer, applications Method: Educational psychologist Method: Ambulating Mobility  Transfers Sit to Stand: Independent Stand to Sit: Independent  Pt sitting up in recliner with wife at side, no c/o pain, reports he washed up this morning at sink by himself because he didn't know he needed to ask for help, but understands after nurse explained to him to ask for help until therapies evaluation.  Initial evaluation complete, education regarding OT scope of practice provided, and collaborated with pt regarding POC.  Pt ambulated to bathroom to complete toilet transfer per above levels.  Pt ambulated to ADL suite without AD with supervision and no LOB. Tub shower transfer completed per above level with brief use of grab bar.  Pt ambulated to large gym and instructed through table top activity using pvc pipe to copy template of various shapes.  Pt completed in normal amount of time with occasional VC for problem solving.  Pt ambulated back to room with supervision and returned to recliner.  Call bell in reach, seat alarm on.  Discharge Criteria: Patient will be discharged from OT if patient refuses treatment 3 consecutive times without medical reason, if treatment goals not met, if there is a change in medical status, if patient makes no progress towards goals or if patient is discharged from hospital.  The above assessment, treatment plan, treatment alternatives and goals were discussed and mutually agreed upon: by patient  Ezekiel Slocumb 05/27/2020, 3:55 PM

## 2020-05-27 NOTE — Progress Notes (Addendum)
Old chart reviewed again--Rocephin order written by Dr. Benny Lennert for 8/24-8/29 (as per plans for original admission and were released yesterday by nurse). He has completed atbx course therefore will d/c. Have discussed need/rationale for K+ supplement with wife and patient yesterday and will review again.

## 2020-05-27 NOTE — Progress Notes (Signed)
[place] PHYSICAL MEDICINE & REHABILITATION PROGRESS NOTE   Subjective/Complaints:  Feels good today, no sweats or chills, no cough, no burning with urination, no muscle aches Discussed mild elevation of temperature Wife at bedside Review of systems negative for chest pain shortness of breath nausea vomiting diarrhea constipation  Objective:   No results found. Recent Labs    05/27/20 0104 05/27/20 0615  WBC 21.4* 25.5*  HGB 9.1* 10.4*  HCT 28.7* 31.9*  PLT 237 349   Recent Labs    05/26/20 2019 05/27/20 0104  NA 138 138  K 3.8 3.8  CL 99 101  CO2 29 26  GLUCOSE 138* 125*  BUN 6 7  CREATININE 0.70 0.62  CALCIUM 9.0 8.8*    Intake/Output Summary (Last 24 hours) at 05/27/2020 1017 Last data filed at 05/27/2020 0730 Gross per 24 hour  Intake 920 ml  Output 2750 ml  Net -1830 ml     Physical Exam: Vital Signs Blood pressure (!) 152/90, pulse 78, temperature 99.8 F (37.7 C), temperature source Oral, resp. rate 17, height 6\' 1"  (1.854 m), weight 97.8 kg, SpO2 97 %.     Assessment/Plan: 1. Functional deficits secondary to debility resulting from alcohol abuse with liver failure which require 3+ hours per day of interdisciplinary therapy in a comprehensive inpatient rehab setting.  Physiatrist is providing close team supervision and 24 hour management of active medical problems listed below.  Physiatrist and rehab team continue to assess barriers to discharge/monitor patient progress toward functional and medical goals  Care Tool:  Bathing              Bathing assist       Upper Body Dressing/Undressing Upper body dressing   What is the patient wearing?: Hospital gown only    Upper body assist Assist Level: Supervision/Verbal cueing    Lower Body Dressing/Undressing Lower body dressing            Lower body assist       Toileting Toileting    Toileting assist Assist for toileting: Contact Guard/Touching assist      Transfers Chair/bed transfer  Transfers assist           Locomotion Ambulation   Ambulation assist              Walk 10 feet activity   Assist           Walk 50 feet activity   Assist           Walk 150 feet activity   Assist           Walk 10 feet on uneven surface  activity   Assist           Wheelchair     Assist               Wheelchair 50 feet with 2 turns activity    Assist            Wheelchair 150 feet activity     Assist          Blood pressure (!) 152/90, pulse 78, temperature 99.8 F (37.7 C), temperature source Oral, resp. rate 17, height 6\' 1"  (1.854 m), weight 97.8 kg, SpO2 97 %.  Medical Problem List and Plan: 1.Functional and mobility deficitssecondary to hemorrhagic shock d/t esophageal bleed/cirrhosis of liver s/p embolization, TIPS procedure with associated hepatic encephalopathy -patient may shower -ELOS/Goals: 5 days, supervision to mod I for PT, OT, SLP, 2. Antithrombotics: -DVT/anticoagulation:Mechanical:Sequential compression  devices, below kneeBilateral lower extremities -antiplatelet therapy: N/A 3. Pain Management:Oxycodone prn?--may need to d/c if confusion continues. 4. Mood:LCSW to follow for evaluation and support. -antipsychotic agents: N/A 5. Neuropsych: This patientIs not fullycapable of making decisions onhisown behalf but he's approaching baseline. -cognition has improved immensely .  6.Rash//Skin/Wound Care:Monitor I/O. Routine pressure relief measures.Will add Sarna to help manage symptoms. 7. Fluids/Electrolytes/Nutrition:Monitor I/O.Check lytes in am. 8. ABLA/GIB s/p TIPS: Continue to monitor H/H with serial checks. Transfuse prn Hgb<7.0.  9. Decompensated Cirrhosis/Hepatic encephalopathy: Ammonia level 132-->recheck in am. -Continue PPI bid -lactulose 20  gram bid -Rifaximin.Has completed 10/10 days Rocephin on 08/29. -To continue prednisone 40 mg daily X 28 days then d/c per GI.Will obviously need to wean, will check with GI 10. Hypokalemia:Multifactorial. Resolved with runs of K+---will add Kdur 20 meq dailyfor supplementas starting to trend down again--potassium normal at 3.8 11. HTN: BP trending up-->Norvasc added 08/25.Clonidineresumed Vitals:   05/26/20 1827 05/27/20 0321  BP: (!) 144/84 (!) 152/90  Pulse: 93 78  Resp: 16 17  Temp: 100.3 F (37.9 C) 99.8 F (37.7 C)  SpO2: 96% 97%   12. Leucocytosis: Likely due to steroids -->monitor for signs of infection. Low-grade temp 99.8, 100.3 yesterday, received MODERNA vaccine yesterday     LOS: 1 days A FACE TO FACE EVALUATION WAS PERFORMED  Charlett Blake 05/27/2020, 10:17 AM

## 2020-05-27 NOTE — Progress Notes (Signed)
   Patient Details  Name: Jimmy Gonzales MRN: [id number] Date of Birth: [birth year]  Today's Date: 05/27/2020  Hospital Problems: Active Problems:   Hepatic encephalopathy [facility])  Past Medical History:  Past Medical History:  Diagnosis Date  . HTN (hypertension)   . Hypothyroid    Past Surgical History:  Past Surgical History:  Procedure Laterality Date  . IR ANGIOGRAM SELECTIVE EACH ADDITIONAL VESSEL  05/16/2020  . IR ANGIOGRAM SELECTIVE EACH ADDITIONAL VESSEL  05/16/2020  . IR ANGIOGRAM SELECTIVE EACH ADDITIONAL VESSEL  05/16/2020  . IR EMBO ART  VEN HEMORR LYMPH EXTRAV  INC GUIDE ROADMAPPING  05/16/2020  . IR TIPS  05/16/2020  . RADIOLOGY WITH ANESTHESIA N/A 05/15/2020   Procedure: TIPS PROCEDURE;  Surgeon: Radiologist, Medication, MD;  Location: [facility];  Service: Radiology;  Laterality: N/A;   Social History:  has an unknown smoking status. His smokeless tobacco use includes chew. He reports current alcohol use. No history on file for drug use.  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: Yes Employment Status: Employed   Abuse/Neglect Abuse/Neglect Assessment Can Be Completed: Yes Physical Abuse: Denies Verbal Abuse: Denies Sexual Abuse: Denies Exploitation of patient/patient's resources: Denies Self-Neglect: Denies  Emotional Status Pt's affect, behavior and adjustment status: Pt and spouse report no concerns Recent Psychosocial Issues: no Psychiatric History: no Substance Abuse History: no  Patient / Family Perceptions, Expectations & Goals Pt/Family understanding of illness & functional limitations: yes, spouse is updated of everything thus far Premorbid pt/family  roles/activities: Employee, Spouse, Father Anticipated changes in roles/activities/participation: Spouse will assit patient in home, patient's goal is to return to work Pt/family expectations/goals: Patient goal is to discharge back home  [place]: None Premorbid Home Care/DME Agencies: None Transportation available at discharge: Family able to transport  Discharge Planning Living Arrangements: Spouse/significant other [place]: Spouse/significant other Type of Residence: Private residence (2 Level home, set up on 1st floor. 2 steps to enter) Insurance Resources: Multimedia programmer (specify) Producer, television/film/video) Living Expenses: Rent Money Management: Spouse Does the patient have any problems obtaining your medications?: No Home Management: Independent Patient/Family Preliminary Plans: Spouse will asisst Care Coordinator Barriers to Discharge: Other (comments) Care Coordinator Barriers to Discharge Comments: Follow up Compliance Care Coordinator Anticipated Follow Up Needs: HH/OP Expected length of stay: 10-14 Days  Clinical Impression Sw entered room spouse at bedside on cell phone. SW introduced self, explained role and process. Will continue to follow up with questions and concerns.   Dyanne Iha 05/27/2020, 1:16 PM

## 2020-05-27 NOTE — Discharge Summary (Signed)
Physician Discharge Summary  Patient ID: Jimmy Gonzales MRN: [id number] DOB/AGE: 10-03-1970 49 y.o.  Admit date: 05/26/2020 Discharge date: 05/29/2020  Discharge Diagnoses:  Principal Problem:   Hepatic encephalopathy ([place]) Active Problems:   Gastric bleed   Decompensated hepatic cirrhosis (H[facility])   Acute blood loss anemia   Leucocytosis   Hypokalemia   Discharged Condition: Stable  Significant Diagnostic Studies: N/A   Labs:  Basic Metabolic Panel: BMP Latest Ref Rng & Units 05/28/2020 05/27/2020 05/26/2020  Glucose 70 - 99 mg/dL 164(H) 125(H) 138(H)  BUN 6 - 20 mg/dL '9 7 6  ' Creatinine 0.61 - 1.24 mg/dL 0.78 0.62 0.70  Sodium 135 - 145 mmol/L 137 138 138  Potassium 3.5 - 5.1 mmol/L 4.0 3.8 3.8  Chloride 98 - 111 mmol/L 99 101 99  CO2 22 - 32 mmol/L '27 26 29  ' Calcium 8.9 - 10.3 mg/dL 9.2 8.8(L) 9.0    CBC: Recent Labs  Lab 05/27/20 0104 05/27/20 0615 05/28/20 0732  WBC 21.4* 25.5* 23.0*  NEUTROABS 19.3*  --  20.0*  HGB 9.1* 10.4* 9.9*  HCT 28.7* 31.9* 31.8*  MCV 93.5 94.7 95.5  PLT 237 349 282    Hepatic Function Latest Ref Rng & Units 05/27/2020 05/26/2020 05/24/2020  Total Protein 6.5 - 8.1 g/dL 6.6 6.4(L) 6.6  Albumin 3.5 - 5.0 g/dL 2.6(L) 2.5(L) 2.7(L)  AST 15 - 41 U/L 126(H) 125(H) 126(H)  ALT 0 - 44 U/L 89(H) 79(H) 73(H)  Alk Phosphatase 38 - 126 U/L 208(H) 192(H) 187(H)  Total Bilirubin 0.3 - 1.2 mg/dL 4.5(H) 4.0(H) 4.5(H)  Bilirubin, Direct 0.0 - 0.2 mg/dL - - -     CBG: No results for input(s): GLUCAP in the last 168 hours.  Brief HPI:   Jimmy R Walth is a 49 y.o. male with history of cirrhosis of the liver, alcohol abuse who was admitted via [place] on 05/15/20 with massive hematemesis and hemorrhagic shock. He required emergent TI PS with embolization of variceal trunks to esophageal varices by Dr. Kathlene Cote.  He was transfused with 2 units PRBCs and OGT placed.  He had frank blood via OGT as well as rectal tube with hypotension and bouts of agitation due  to hepatic encephalopathy--ammonia level 154.  Hospital course significant for AKI, abdominal distention with abdominal pain, hypokalemia, leukocytosis as well as bouts of confusion.  [facility] has been following for input and recommended serial monitoring of H&H as well as PPI twice daily, lactulose, corticosteroids and Xifaxan for treatment of decompensated hepatic cirrhosis.  He completed 10-day course of Rocephin with recommendations to continue 40 mg prednisone x 28 days.  GI symptoms had improved and agitation was resolving.  Patient continued to have balance deficits as well as high-level cognitive deficits and CIR was recommended due to functional decline.   Hospital Course: Jimmy R Mulvehill was admitted to rehab 05/26/2020 for inpatient therapies to consist of PT, ST and OT at least three hours five days a week. Past admission physiatrist, therapy team and rehab RN have worked together to provide customized collaborative inpatient rehab.K-dur was added due to hypokalemia and as steroids being on board. Follow up BMET showed K+ levels to be WNL. Leucocytosis felt to be due to steroids and WBC is stable and no signs of infection noted. He is to continue on prednisone to complete 28 days treatment and follow up with GI for further input. Hgb has been stable on protonix bid. Blood pressures were monitored on TID basis and has been stable  on Norvasc alone.   During patient's stay in brief team conference was held to discuss patient's progress, set goals and discuss barriers to discharge. At admission, patient required supervision with ADLs and CGA with mobility. He required mild higher level cognitive deficits. He  has had improvement in activity tolerance, balance, postural control as well as ability to compensate for deficits. He is able to complete ADL tasks at modified independent level. He 's modified independent for transfers and mobility. He is able to complete cognitive tasks with supervision to  modified independent level with cues. Family education was completed with wife.   Disposition: Home.  Diet: Regular.   Special Instructions:  1. Recommend repeat CBC and BMET to follow up on WBC and potassium level. 2.  Recommend repeat TSH in 3-4 weeks and titrate levothyroxine dose as indicated.  3. To resume driving gradually as advised.    Allergies as of 05/29/2020   No Known Allergies     Medication List    STOP taking these medications   bisoprolol-hydrochlorothiazide 5-6.25 MG tablet Commonly known as: ZIAC   cloNIDine 0.1 MG tablet Commonly known as: CATAPRES   omega-3 acid ethyl esters 1 g capsule Commonly known as: LOVAZA     TAKE these medications   acetaminophen 325 MG tablet Commonly known as: TYLENOL Take 1-2 tablets (325-650 mg total) by mouth every 4 (four) hours as needed for mild pain.   amLODipine 5 MG tablet Commonly known as: NORVASC Take 1 tablet (5 mg total) by mouth daily.   camphor-menthol lotion Commonly known as: SARNA Apply topically 4 (four) times daily -  with meals and at bedtime.   lactulose 10 GM/15ML solution Commonly known as: CHRONULAC Take 30 mLs (20 g total) by mouth 2 (two) times daily. Notes to patient: To clear ammonia levels   levothyroxine 25 MCG tablet Commonly known as: SYNTHROID Take 1 tablet (25 mcg total) by mouth daily at 6 (six) AM. Notes to patient: On empty stomach and do not eat or drink anything for 45-60 minutes.    Opcon-A 0.027-0.315 % Soln Generic drug: Naphazoline-Pheniramine Place 1 drop into both eyes daily as needed (dry eyes/irritation).   pantoprazole 40 MG tablet Commonly known as: PROTONIX Take 1 tablet (40 mg total) by mouth 2 (two) times daily. What changed: Another medication with the same name was removed. Continue taking this medication, and follow the directions you see here. Notes to patient: To help ulcer heal   potassium chloride SA 20 MEQ tablet Commonly known as: KLOR-CON Take 1  tablet (20 mEq total) by mouth daily.   prednisoLONE 5 MG Tabs tablet Take 8 tablets (40 mg total) by mouth daily.   rifaximin 550 MG Tabs tablet Commonly known as: XIFAXAN Take 1 tablet (550 mg total) by mouth 2 (two) times daily.   thiamine 100 MG tablet Take 1 tablet (100 mg total) by mouth daily. Notes to patient: To clear ammonia and to help with cognition.    traZODone 50 MG tablet Commonly known as: DESYREL Take 0.5-1 tablets (25-50 mg total) by mouth at bedtime as needed for sleep.       Follow-up Information    Kirsteins, Luanna Salk, MD. Call.   Specialty: Physical Medicine and Rehabilitation Why: as needed Contact information: [place] [address] [phone]        Ronnette Juniper, MD. Call.   Specialty: Gastroenterology Why: for follow up appointment Contact information: [place]  [place] [address] [phone]        Helen Hashimoto., MD. Call.   Specialty: Internal Medicine Why: for post hospital follow up Contact information: [place] [address] [phone]               Signed: Bary Leriche 06/01/2020, 10:14 PM

## 2020-05-27 NOTE — Evaluation (Signed)
Physical Therapy Assessment and Plan  Patient Details  Name: Jimmy Gonzales MRN: [id number] Date of Birth: 03-12-1971  PT Diagnosis: Abnormality of gait, Cognitive deficits and Muscle weakness Rehab Potential: Excellent ELOS: 1-3 days   Today's Date: 05/27/2020 PT Individual Time: 9678-9381 PT Individual Time Calculation (min): 75 min    Hospital Problem: Active Problems:   Hepatic encephalopathy [facility])   Past Medical History:  Past Medical History:  Diagnosis Date  . HTN (hypertension)   . Hypothyroid    Past Surgical History:  Past Surgical History:  Procedure Laterality Date  . IR ANGIOGRAM SELECTIVE EACH ADDITIONAL VESSEL  05/16/2020  . IR ANGIOGRAM SELECTIVE EACH ADDITIONAL VESSEL  05/16/2020  . IR ANGIOGRAM SELECTIVE EACH ADDITIONAL VESSEL  05/16/2020  . IR EMBO ART  VEN HEMORR LYMPH EXTRAV  INC GUIDE ROADMAPPING  05/16/2020  . IR TIPS  05/16/2020  . RADIOLOGY WITH ANESTHESIA N/A 05/15/2020   Procedure: TIPS PROCEDURE;  Surgeon: Radiologist, Medication, MD;  Location: [place];  Service: Radiology;  Laterality: N/A;    Assessment & Plan Clinical Impression: Patient is a 49 y.o. year old male with history of cirrhosis of liver, alcohol abuse who was admitted on via [facility] [place] 05/15/20 with massive hematemesis with hemorrhagic shock and inability to treat bleeding. He required emergent TIPS with embolization of variceal trunks to esophageal varices by Dr. Linard Millers. He was required 2 units PRBC and OGT. He had frank blood via OGT as well as rectal tube as hypotension with bouts of agitation due to hepatic encephalopathy--ammonia level 154. neprology consulted for input on AKI and recommended supportive care. He tolerated extubation by 08/23 and on regular diet as no signs of dysphagia.   Dr. Cristina Gong following for input and recommended monitoring of H/H as well as PPI bid, Lactulose, corticosteroids and Xifaxan. To complete 10 day course of Rocephin for SB On SBO noted but tolerating po's  with liquid stools. He developed abdominal pain due to progressive distension and abdominal ultrasound without significant ascites.He continuedto have confusion with leucocytosis--WBC up to 21.9today, hypokalemia, cognitive deficits as well as unsteady gait with high fall risk. Required haldol and Seroquel-->Seroquel has been discontinued due to incontinence/wife's concerns. Therapy on going and patient with balance deficits as well as difficulty with higher level cognitive tasks. Patient with functional deficits and CIR recommendedfor follow up therapy.Patient transferred to CIR on 05/26/2020 .   Patient currently requires CGA With mobility secondary to muscle weakness, decreased cardiorespiratoy endurance and decreased standing balance and decreased balance strategies.  Prior to hospitalization, patient was independent  with mobility and lived with Spouse in a House home.  Home access is 1Stairs to enter.  Patient will benefit from skilled PT intervention to maximize safe functional mobility, minimize fall risk and decrease caregiver burden for planned discharge home with intermittent supervision.  Anticipate patient will benefit from follow up OP at discharge.  PT - End of Session Activity Tolerance: Tolerates 30+ min activity without fatigue Endurance Deficit: Yes Endurance Deficit Description: fatigued towards end of session PT Assessment Rehab Potential (ACUTE/IP ONLY): Excellent PT Patient demonstrates impairments in the following area(s): Balance;Safety;Endurance;Motor;Nutrition;Pain PT Transfers Functional Problem(s): Bed Mobility;Bed to Chair;Car;Furniture;Floor PT Locomotion Functional Problem(s): Ambulation;Stairs PT Plan PT Intensity: Minimum of 1-2 x/day ,45 to 90 minutes PT Frequency: 5 out of 7 days PT Duration Estimated Length of Stay: 3-5 days PT Treatment/Interventions: Ambulation/gait training;Community reintegration;DME/adaptive equipment instruction;Neuromuscular  re-education;Psychosocial support;Stair training;UE/LE Strength taining/ROM;Balance/vestibular training;Discharge planning;Functional electrical stimulation;Pain management;Skin care/wound management;Therapeutic Activities;UE/LE Coordination activities;Cognitive remediation/compensation;Disease management/prevention;Functional mobility  training;Patient/family education;Splinting/orthotics;Therapeutic Exercise;Visual/perceptual remediation/compensation PT Transfers Anticipated Outcome(s): mod-I PT Locomotion Anticipated Outcome(s): mod-I PT Recommendation Follow Up Recommendations: Outpatient PT Patient destination: Home Equipment Recommended: None recommended by PT   PT Evaluation Precautions/Restrictions Precautions Precautions: Fall Restrictions Weight Bearing Restrictions: No Pain Pain Assessment Pain Scale: 0-10 Pain Score: 0-No pain Home Living/Prior Functioning Home Living Living Arrangements: Spouse/significant other Available Help at Discharge: Family;Available 24 hours/day Type of Home: House Home Access: Stairs to enter [employer] of Steps: 1 (8-10inches height) Home Layout: Two level;Able to live on main level with bedroom/bathroom Bathroom Shower/Tub: Multimedia programmer: Handicapped height  Lives With: Spouse Prior Function Level of Independence: Independent with basic ADLs;Independent with homemaking with ambulation;Independent with gait;Independent with transfers  Able to Take Stairs?: Yes Driving: Yes Vocation: Full time employment Comments: pt runs a Copywriter, 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, works in the Insurance account manager Perception: Within [place]: Intact  Cognition  Overall Cognitive Status: Impaired/Different from baseline Arousal/Alertness: Awake/alert Orientation Level: Oriented X4 Attention: Focused;Sustained Focused Attention: Appears intact Sustained Attention: Appears intact Awareness:  Appears intact Safety/Judgment: Appears intact Sensation Sensation Light Touch: Appears Intact Hot/Cold: Not tested Proprioception: Appears Intact Stereognosis: Not tested Coordination Gross Motor Movements are Fluid and Coordinated: Yes Motor  Motor Motor: Other (comment) Motor - Skilled Clinical Observations: generalized weakness, slight impaired endurance   Trunk/Postural Assessment  Cervical Assessment Cervical Assessment: Within Functional Limits Thoracic Assessment Thoracic Assessment: Within Functional Limits Lumbar Assessment Lumbar Assessment: Within Functional Limits Postural Control Postural Control: Deficits on evaluation Postural Limitations: decreased balance with narrow BOS  Balance Balance Balance Assessed: Yes Standardized Balance Assessment Standardized Balance Assessment: Functional Gait Assessment;Berg Balance Test Berg Balance Test Sit to Stand: Able to stand without using hands and stabilize independently Standing Unsupported: Able to stand safely 2 minutes Sitting with Back Unsupported but Feet Supported on Floor or Stool: Able to sit safely and securely 2 minutes Stand to Sit: Sits safely with minimal use of hands Transfers: Able to transfer safely, minor use of hands Standing Unsupported with Eyes Closed: Able to stand 10 seconds with supervision Standing Ubsupported with Feet Together: Able to place feet together independently and stand for 1 minute with supervision (minor sway anterior to R) From Standing, Reach Forward with Outstretched Arm: Can reach forward >12 cm safely (5") From Standing Position, Pick up Object from Floor: Able to pick up shoe safely and easily From Standing Position, Turn to Look Behind Over each Shoulder: Looks behind one side only/other side shows less weight shift (slight instability noted when turning to L) Turn 360 Degrees: Needs close supervision or verbal cueing Standing Unsupported, Alternately Place Feet on  Step/Stool: Able to complete 4 steps without aid or supervision Standing Unsupported, One Foot in Front: Able to plae foot ahead of the other independently and hold 30 seconds Standing on One Leg: Able to lift leg independently and hold 5-10 seconds Total Score: 45 Static Sitting Balance Static Sitting - Level of Assistance: 7: Independent Dynamic Sitting Balance Dynamic Sitting - Level of Assistance: 7: Independent Static Standing Balance Static Standing - Level of Assistance: 5: Stand by assistance Dynamic Standing Balance Dynamic Standing - Level of Assistance: 5: Stand by assistance;4: Min assist Functional Gait  Assessment Gait assessed : Yes Gait Level Surface: Walks 20 ft in less than 5.5 sec, no assistive devices, good speed, no evidence for imbalance, normal gait pattern, deviates no more than 6 in outside of the 12 in walkway width. Change in Gait  Speed: Able to smoothly change walking speed without loss of balance or gait deviation. Deviate no more than 6 in outside of the 12 in walkway width. Gait with Horizontal Head Turns: Performs head turns smoothly with slight change in gait velocity (eg, minor disruption to smooth gait path), deviates 6-10 in outside 12 in walkway width, or uses an assistive device. Gait with Vertical Head Turns: Performs task with slight change in gait velocity (eg, minor disruption to smooth gait path), deviates 6 - 10 in outside 12 in walkway width or uses assistive device Gait and Pivot Turn: Pivot turns safely within 3 sec and stops quickly with no loss of balance. Step Over Obstacle: Is able to step over 2 stacked shoe boxes taped together (9 in total height) without changing gait speed. No evidence of imbalance. Gait with Narrow Base of Support: Ambulates less than 4 steps heel to toe or cannot perform without assistance. (requires heavy min assist throughout to maintain balance) Gait with Eyes Closed: Cannot walk 20 ft without assistance, severe gait  deviations or imbalance, deviates greater than 15 in outside 12 in walkway width or will not attempt task. (strong veer to R requiring assist for balance) Ambulating Backwards: Walks 20 ft, uses assistive device, slower speed, mild gait deviations, deviates 6-10 in outside 12 in walkway width. (has to walk very slow to maintain balance with min assist) Steps: Alternating feet, no rail. Total Score: 21 Extremity Assessment  RLE Assessment RLE Assessment: Exceptions to [facility] Active Range of Motion (AROM) Comments: WFL RLE Strength Right Hip Flexion: 4+/5 Right Knee Flexion: 4+/5 Right Knee Extension: 4+/5 Right Ankle Dorsiflexion: 3+/5 Right Ankle Plantar Flexion: 3+/5 LLE Assessment LLE Assessment: Exceptions to [facility] Active Range of Motion (AROM) Comments: WFL LLE Strength Left Hip Flexion: 4+/5 Left Knee Flexion: 4+/5 Left Knee Extension: 4+/5 Left Ankle Dorsiflexion: 3+/5 Left Ankle Plantar Flexion: 3+/5  Care Tool Care Tool Bed Mobility Roll left and right activity   Roll left and right assist level: Independent    Sit to lying activity   Sit to lying assist level: Independent    Lying to sitting edge of bed activity   Lying to sitting edge of bed assist level: Independent     Care Tool Transfers Sit to stand transfer   Sit to stand assist level: Supervision/Verbal cueing    Chair/bed transfer   Chair/bed transfer assist level: Contact Guard/Touching assist     Toilet transfer       Car transfer   Car transfer assist level: Contact Guard/Touching assist      Care Tool Locomotion Ambulation   Assist level: Contact Guard/Touching assist Assistive device: No Device Max distance: 353f  Walk 10 feet activity   Assist level: Contact Guard/Touching assist Assistive device: No Device   Walk 50 feet with 2 turns activity   Assist level: Contact Guard/Touching assist Assistive device: No Device  Walk 150 feet activity   Assist level: Contact Guard/Touching  assist Assistive device: No Device  Walk 10 feet on uneven surfaces activity   Assist level: Contact Guard/Touching assist Assistive device: Other (comment) (none)  Stairs   Assist level: Contact Guard/Touching assist Stairs assistive device: 2 hand rails Max number of stairs: 12  Walk up/down 1 step activity   Walk up/down 1 step (curb) assist level: Contact Guard/Touching assist Walk up/down 1 step or curb assistive device: 2 hand rails    Walk up/down 4 steps activity Walk up/down 4 steps assist level: Contact Guard/Touching assist Walk  up/down 4 steps assistive device: 2 hand rails  Walk up/down 12 steps activity   Walk up/down 12 steps assist level: Contact Guard/Touching assist Walk up/down 12 steps assistive device: 2 hand rails  Pick up small objects from floor   Pick up small object from the floor assist level: Contact Guard/Touching assist    Wheelchair Will patient use wheelchair at discharge?: No          Wheel 50 feet with 2 turns activity      Wheel 150 feet activity        Refer to Care Plan for Long Term Goals  SHORT TERM GOAL WEEK 1 PT Short Term Goal 1 (Week 1): = to LTGs based on ELOS  Recommendations for other services: None   Skilled Therapeutic Intervention Mobility Bed Mobility Bed Mobility: Supine to Sit;Sit to Supine Supine to Sit: Independent Sit to Supine: Independent Transfers Transfers: Sit to Stand;Stand to Sit;Stand Pivot Transfers Sit to Stand: Supervision/Verbal cueing Stand to Sit: Supervision/Verbal cueing Stand Pivot Transfers: Supervision/Verbal cueing Transfer (Assistive device): None Locomotion  Gait Ambulation: Yes Gait Assistance: Contact Guard/Touching assist;Supervision/Verbal cueing Gait Distance (Feet): 450 Feet Assistive device: None Gait Gait: Yes Gait Pattern: Within Functional Limits High Level Ambulation High Level Ambulation: Side stepping;Backwards walking;Direction changes;Sudden stops;Head turns;Other  high level ambulation (details in FGA) Stairs / Additional Locomotion Stairs: Yes Stairs Assistance: Contact Guard/Touching assist Stair Management Technique: Two rails Number of Stairs: 12 Height of Stairs: 6 Ramp: Contact Guard/touching assist Curb: Contact Guard/Touching assist (8" height to prepare for home entry) Wheelchair Mobility Wheelchair Mobility: No   Evaluation completed (see details above) with education on PT POC and goals and individual treatment initiated with focus on standing balance and dynamic gait. Patient participated in the above mobility tasks requiring the assistance levels described. Participated in [employer] and demonstrates increased fall risk as noted by score of  45/56.  (<36= high risk for falls, close to 100%; 37-45 significant >80%; 46-51 moderate >50%; 52-55 lower >25%). Patient educated on results of assessment and noted most difficulty with tasks involving eyes close, standing tandem, and rotating body/turning. Participated in Functional Gait Assessment demonstrating medium increased risk for falls as noted by score of 21/30 with pt noted to have most instability with gait with eyes closed (veering R, though improved on 2nd and 3rd trials), walking backwards, and tandem walking (required heavy min assist for balance). Pt educated on results of this assessment and performed additional dynamic gait focusing on those specific tasks with pt already demonstrating improving balance requiring less assistance. Educated pt on performing seated ankle DF against level 3 theraband resistance to address pt's impaired muscle strength outside of therapy sessions. Standing ankle DF/PF x12reps each without UE support and CGA for steadying. Gait training ~143f back to room, no AD, with close supervision and left seated in recliner with needs in reach, chair alarm on, and pt's wife present.  Discharge Criteria: Patient will be discharged from PT if patient refuses treatment  3 consecutive times without medical reason, if treatment goals not met, if there is a change in medical status, if patient makes no progress towards goals or if patient is discharged from hospital.  The above assessment, treatment plan, treatment alternatives and goals were discussed and mutually agreed upon: by patient and by family  CTawana Scale, PT, DPT, CSRS  05/27/2020, 12:23 PM

## 2020-05-28 ENCOUNTER — Inpatient Hospital Stay (HOSPITAL_COMMUNITY): Payer: BC Managed Care – PPO | Admitting: Occupational Therapy

## 2020-05-28 ENCOUNTER — Inpatient Hospital Stay (HOSPITAL_COMMUNITY): Payer: BC Managed Care – PPO | Admitting: Physical Therapy

## 2020-05-28 ENCOUNTER — Inpatient Hospital Stay (HOSPITAL_COMMUNITY): Payer: BC Managed Care – PPO | Admitting: Speech Pathology

## 2020-05-28 LAB — CBC WITH DIFFERENTIAL/PLATELET
Abs Immature Granulocytes: 0.23 10*3/uL — ABNORMAL HIGH (ref 0.00–0.07)
Basophils Absolute: 0.1 10*3/uL (ref 0.0–0.1)
Basophils Relative: 0 %
Eosinophils Absolute: 0.2 10*3/uL (ref 0.0–0.5)
Eosinophils Relative: 1 %
HCT: 31.8 % — ABNORMAL LOW (ref 39.0–52.0)
Hemoglobin: 9.9 g/dL — ABNORMAL LOW (ref 13.0–17.0)
Immature Granulocytes: 1 %
Lymphocytes Relative: 8 %
Lymphs Abs: 1.9 10*3/uL (ref 0.7–4.0)
MCH: 29.7 pg (ref 26.0–34.0)
MCHC: 31.1 g/dL (ref 30.0–36.0)
MCV: 95.5 fL (ref 80.0–100.0)
Monocytes Absolute: 0.7 10*3/uL (ref 0.1–1.0)
Monocytes Relative: 3 %
Neutro Abs: 20 10*3/uL — ABNORMAL HIGH (ref 1.7–7.7)
Neutrophils Relative %: 87 %
Platelets: 282 10*3/uL (ref 150–400)
RBC: 3.33 MIL/uL — ABNORMAL LOW (ref 4.22–5.81)
RDW: 19.5 % — ABNORMAL HIGH (ref 11.5–15.5)
WBC: 23 10*3/uL — ABNORMAL HIGH (ref 4.0–10.5)
nRBC: 0 % (ref 0.0–0.2)

## 2020-05-28 LAB — BASIC METABOLIC PANEL
Anion gap: 11 (ref 5–15)
BUN: 9 mg/dL (ref 6–20)
CO2: 27 mmol/L (ref 22–32)
Calcium: 9.2 mg/dL (ref 8.9–10.3)
Chloride: 99 mmol/L (ref 98–111)
Creatinine, Ser: 0.78 mg/dL (ref 0.61–1.24)
GFR calc Af Amer: >60 mL/min (ref >60)
GFR calc non Af Amer: >60 mL/min (ref >60)
Glucose, Bld: 164 mg/dL — ABNORMAL HIGH (ref 70–99)
Potassium: 4 mmol/L (ref 3.5–5.1)
Sodium: 137 mmol/L (ref 135–145)

## 2020-05-28 NOTE — Progress Notes (Signed)
Occupational Therapy Session Note  Patient Details  Name: Jimmy Gonzales MRN: [id number] Date of Birth: May 20, 1971  Today's Date: 05/28/2020 OT Individual Time: 7207-2182 OT Individual Time Calculation (min): 62 min    Short Term Goals: Week 1:  OT Short Term Goal 1 (Week 1): STGs=LTGs due to ELOS  Skilled Therapeutic Interventions/Progress Updates:  Patient met seated in recliner in agreement with OT treatment session with focus on self-care re-education, higher level balance activities, and therapeutic exercise as detailed below. Patient able to gather all ADL items in prep for bathing/dressing at shower level. UB bathing/dressing in standing with independence. Patient able to don UB/LB clothing in standing independently. Functional organization slightly below baseline. Functional mobility to dayroom without AD. Patient engaged in therapeutic exercise with use of NuStep for 10 minutes without rest break. Session concluded with patient seated in recliner with belt alarm activated and all needs met.   Therapy Documentation Precautions:  Precautions Precautions: Fall Restrictions Weight Bearing Restrictions: No General:    Therapy/Group: Individual Therapy  Lanyiah Brix R [place]-Davis 05/28/2020, 10:05 AM

## 2020-05-28 NOTE — Progress Notes (Signed)
Jimmy Gonzales PHYSICAL MEDICINE & REHABILITATION PROGRESS NOTE   Subjective/Complaints:  In OT, standing activities, able to pick up object off floor.  Indep with ADL this am , some loose BMs on lactulose   Review of systems negative for chest pain shortness of breath nausea vomiting diarrhea constipation  Objective:   No results found. Recent Labs    05/27/20 0615 05/28/20 0732  WBC 25.5* 23.0*  HGB 10.4* 9.9*  HCT 31.9* 31.8*  PLT 349 282   Recent Labs    05/27/20 0104 05/28/20 0732  NA 138 137  K 3.8 4.0  CL 101 99  CO2 26 27  GLUCOSE 125* 164*  BUN 7 9  CREATININE 0.62 0.78  CALCIUM 8.8* 9.2    Intake/Output Summary (Last 24 hours) at 05/28/2020 0858 Last data filed at 05/28/2020 0810 Gross per 24 hour  Intake 1314 ml  Output 825 ml  Net 489 ml     Physical Exam: Vital Signs Blood pressure (!) 146/82, pulse 73, temperature 98.1 F (36.7 C), resp. rate 17, height '6\' 1"'  (1.854 m), weight 96.7 kg, SpO2 98 %.  General: No acute distress Mood and affect are appropriate Heart: Regular rate and rhythm no rubs murmurs or extra sounds Lungs: Clear to auscultation, breathing unlabored, no rales or wheezes Abdomen: Positive bowel sounds, soft nontender to palpation, nondistended Extremities: No clubbing, cyanosis, or edema Skin: No evidence of breakdown, no evidence of rash Neurologic: Cranial nerves II through XII intact, motor strength is 5/5 in bilateral deltoid, bicep, tricep, grip, hip flexor, knee extensors, ankle dorsiflexor and plantar flexor Sensory exam normal sensation to light touch and proprioception in bilateral upper and lower extremities Cerebellar exam normal finger to nose to finger as well as heel to shin in bilateral upper and lower extremities Musculoskeletal: Full range of motion in all 4 extremities. No joint swelling     Assessment/Plan: 1. Functional deficits secondary to debility resulting from alcohol abuse with liver failure which require  3+ hours per day of interdisciplinary therapy in a comprehensive inpatient rehab setting.  Physiatrist is providing close team supervision and 24 hour management of active medical problems listed below.  Physiatrist and rehab team continue to assess barriers to discharge/monitor patient progress toward functional and medical goals  Care Tool:  Bathing    Body parts bathed by patient: Right arm, Right upper leg, Left arm, Left upper leg, Chest, Right lower leg, Abdomen, Front perineal area, Left lower leg, Buttocks, Face         Bathing assist Assist Level: Contact Guard/Touching assist     Upper Body Dressing/Undressing Upper body dressing   What is the patient wearing?: Hospital gown only    Upper body assist Assist Level: Supervision/Verbal cueing    Lower Body Dressing/Undressing Lower body dressing      What is the patient wearing?: Underwear/pull up, Pants     Lower body assist Assist for lower body dressing: Independent     Toileting Toileting    Toileting assist Assist for toileting: Independent     Transfers Chair/bed transfer  Transfers assist     Chair/bed transfer assist level: Independent     Locomotion Ambulation   Ambulation assist      Assist level: Contact Guard/Touching assist Assistive device: No Device Max distance: 349f   Walk 10 feet activity   Assist     Assist level: Contact Guard/Touching assist Assistive device: No Device   Walk 50 feet activity   Assist  Assist level: Contact Guard/Touching assist Assistive device: No Device    Walk 150 feet activity   Assist    Assist level: Contact Guard/Touching assist Assistive device: No Device    Walk 10 feet on uneven surface  activity   Assist     Assist level: Contact Guard/Touching assist Assistive device: Other (comment) (none)   Wheelchair     Assist Will patient use wheelchair at discharge?: No             Wheelchair 50 feet with 2  turns activity    Assist            Wheelchair 150 feet activity     Assist          Blood pressure (!) 146/82, pulse 73, temperature 98.1 F (36.7 C), resp. rate 17, height '6\' 1"'  (1.854 m), weight 96.7 kg, SpO2 98 %.  Medical Problem List and Plan: 1.Functional and mobility deficitssecondary to hemorrhagic shock d/t esophageal bleed/cirrhosis of liver s/p embolization, TIPS procedure with associated hepatic encephalopathy -patient may shower Team conference today please see physician documentation under team conference tab, met with team  to discuss problems,progress, and goals. Formulized individual treatment plan based on medical history, underlying problem and comorbidities.   2. Antithrombotics: -DVT/anticoagulation:Mechanical:Sequential compression devices, below kneeBilateral lower extremities -antiplatelet therapy: N/A 3. Pain Management:Oxycodone prn?--may need to d/c if confusion continues. 4. Mood:LCSW to follow for evaluation and support. -antipsychotic agents: N/A 5. Neuropsych: This patientIs not fullycapable of making decisions onhisown behalf but he's approaching baseline. -cognition has improved immensely .  6.Rash//Skin/Wound Care:Monitor I/O. Routine pressure relief measures.Will add Sarna to help manage symptoms. 7. Fluids/Electrolytes/Nutrition:Monitor I/O.Check lytes in am. 8. ABLA/GIB s/p TIPS: Continue to monitor H/H with serial checks. Transfuse prn Hgb<7.0.  9. Decompensated Cirrhosis/Hepatic encephalopathy: Ammonia level 132-->recheck in am. -Continue PPI bid -lactulose 20 gram bid -Rifaximin.Has completed 10/10 days Rocephin on 08/29. -To continue prednisone 40 mg daily X 28 days then d/c per GI.no wean per Dr Therisa Doyne 10. Hypokalemia:Multifactorial. Resolved with runs of K+---will add Kdur 20 meq dailyfor supplementas  starting to trend down again--potassium normal at 3.8 11. HTN: BP trending up-->Norvasc added 08/25.Clonidineresumed Vitals:   05/27/20 1931 05/28/20 0356  BP: 140/82 (!) 146/82  Pulse: 72 73  Resp: 18 17  Temp: 98.5 F (36.9 C) 98.1 F (36.7 C)  SpO2: 43% 60%  min systolic elevation , cont current meds  12. Leucocytosis: Likely due to steroids -->monitor for signs of infection. Low-grade temp resolved received MODERNA vaccine 8/30     LOS: 2 days A FACE TO [place] E Savva Beamer 05/28/2020, 8:58 AM

## 2020-05-28 NOTE — Progress Notes (Signed)
Patient ID: Jimmy Gonzales, male   DOB: [birth year], 49 y.o.   MRN: [id number]   Patient referral faxed to [facility] for follow up PT and OT.   [place], [place]

## 2020-05-28 NOTE — Progress Notes (Signed)
Physical Therapy Discharge Summary  Patient Details  Name: Jimmy Gonzales MRN: [id number] Date of Birth: 1971-01-27  Today's Date: 05/28/2020 PT Individual Time: 1100-1200 PT Individual Time Calculation (min): 60 min    Patient has met 7 of 7 long term goals due to improved activity tolerance, improved balance, improved postural control, increased strength, ability to compensate for deficits, improved attention and improved awareness.  Patient to discharge at an ambulatory level Modified Independent.   Patient's care partner is independent to provide the necessary physical and cognitive assistance at discharge.  Reasons goals not met: all PT goals met   Recommendation:  Patient will benefit from ongoing skilled PT services in outpatient setting to continue to advance safe functional mobility, address ongoing impairments in balance, gait, coordination, awareness, safet, and minimize fall risk.  Equipment: No equipment provided  Reasons for discharge: treatment goals met and discharge from hospital  Patient/family agrees with progress made and goals achieved: Yes    PT treatment:  Pt received sitting in WC and agreeable to PT. Gait training through hall without cues or assist from PT. Dynamic balance training to perform agility ladder: reciprocal step, SLS 3 sec hold, side stepping, in/out and forward/back with side step. Supervision assist  Overall with intermittent min assist while in SLS. Dynamic gait training   Through various environments in hospital including hall of rehab unit, atrium, and over cement sidewalk at entrance to [facility]. No cues or assist at all throughout gait training 1063f +. Stair management x 24steps with self selected step through pattern and 1 UE support on rail, no cues or assist from PT. PT instructed pt in Grad day assessment to measure progress toward goals. See below for details.[facility] and all needs  met.     PT Discharge Precautions/Restrictions   Vital Signs  Pain Pain Assessment Pain Scale: 0-10 Pain Score: 0-No pain Vision/Perception  Perception Perception: Within Functional Limits Praxis Praxis: Intact  Cognition Overall Cognitive Status: Impaired/Different from baseline Arousal/Alertness: Awake/alert Attention: Focused;Sustained Focused Attention: Appears intact Sustained Attention: Appears intact Divided Attention: Impaired Divided Attention Impairment: Functional complex Memory: Impaired Memory Impairment: Decreased recall of new information Decreased Short Term Memory: Functional complex Safety/Judgment: Appears intact Sensation Sensation Light Touch: Appears Intact Proprioception: Appears Intact Coordination Gross Motor Movements are Fluid and Coordinated: Yes Fine Motor Movements are Fluid and Coordinated: Yes Finger Nose Finger Test: WFL R and L Heel Shin Test: WFL R and L Motor  Motor Motor: Within Functional Limits  Mobility Bed Mobility Bed Mobility: Supine to Sit;Sit to Supine;Rolling Left;Rolling Right Supine to Sit: Independent Sit to Supine: Independent Transfers Transfers: Sit to Stand;Stand to Sit;Stand Pivot Transfers Sit to Stand: Independent Stand to Sit: Independent Stand Pivot Transfers: Independent Transfer (Assistive device): None Locomotion  Gait Ambulation: Yes Gait Assistance: Independent with assistive device Gait Distance (Feet): 1000 Feet Assistive device: None Gait Gait: Yes Gait Pattern: Within Functional Limits High Level Ambulation High Level Ambulation: Side stepping;Sudden stops;Head turns Stairs / Additional Locomotion Stairs: Yes Stairs Assistance: Independent with assistive device Stair Management Technique: One rail Right Number of Stairs: 25 Height of Stairs: 6 Wheelchair Mobility Wheelchair Mobility: No  Trunk/Postural Assessment  Cervical Assessment Cervical Assessment: Within Functional  Limits Thoracic Assessment Thoracic Assessment: Within Functional Limits Lumbar Assessment Lumbar Assessment: Within Functional Limits Postural Control Postural Control: Within Functional Limits  Balance Balance Balance Assessed: Yes Static Sitting Balance Static Sitting - Level of Assistance:  7: Independent Dynamic Sitting Balance Dynamic Sitting - Level of Assistance: 7: Independent Static Standing Balance Static Standing - Balance Support: No upper extremity supported;During functional activity Static Standing - Level of Assistance: 7: Independent Dynamic Standing Balance Dynamic Standing - Balance Support: No upper extremity supported;During functional activity Dynamic Standing - Level of Assistance: 6: Modified independent (Device/Increase time) Extremity Assessment      RLE Assessment RLE Assessment: Within Functional Limits Active Range of Motion (AROM) Comments: WFL RLE Strength Right Hip Flexion: 4+/5 Right Knee Flexion: 5/5 Right Knee Extension: 5/5 Right Ankle Dorsiflexion: 4+/5 Right Ankle Plantar Flexion: 4+/5 LLE Assessment LLE Assessment: Within Functional Limits Active Range of Motion (AROM) Comments: WFL LLE Strength Left Hip Flexion: 4+/5 Left Knee Flexion: 5/5 Left Knee Extension: 5/5 Left Ankle Dorsiflexion: 4+/5 Left Ankle Plantar Flexion: 4+/5    Jimmy Gonzales 05/28/2020, 11:54 AM

## 2020-05-28 NOTE — Patient Care Conference (Signed)
Inpatient RehabilitationTeam Conference and Plan of Care Update Date: 05/28/2020   Time: 10:02 AM    Patient Name: Jimmy Gonzales      Medical Record Number: [id number]  Date of Birth: 06-26-1971 Sex: Male         Room/Bed: 4W12C/4W12C-01 Payor Info: Payor: [place] / Plan: [employer] COMM PPO / Product Type: *No Product type* /    Admit Date/Time:  05/26/2020  5:55 PM  Primary Diagnosis:  <principal problem not specified>  Hospital Problems: Active Problems:   Hepatic encephalopathy [facility])    Expected Discharge Date: Expected Discharge Date: 05/29/20  Team Members Present: Physician leading conference: Dr. Alysia Penna Care Coodinator Present: Dorien Chihuahua, RN, BSN, CRRN;Christina Sampson Goon, BSW Nurse Present: Serena Croissant, LPN PT Present: Barrie Folk, PT OT Present: Turner Daniels, OT SLP Present: Weston Anna, SLP PPS Coordinator present : Ileana Ladd, Burna Mortimer, SLP     Current Status/Progress Goal Weekly Team Focus  Bowel/Bladder   Pt is continent of bowel and bladder. LBM-05/27/2020  To remain continent of bowel/bladder.  Assess tolieting needs as needed.   Swallow/Nutrition/ Hydration             ADL's   Supervision to independent with BADLs and functional mobility  Independent  IE yesterday, cognitive training, tub transfers, dc planning   Mobility   independent bed mobility, supervision transfers, CGA progressing to supervision gait up to 369ft without AD, CGA 12 steps using B HRs as needed - Berg 45/56 and FGA 21/30  independent  evaluation, dynamic gait training, dynamic standing balance, pt education, discharge planning, activity tolerance, LE strengthening   Communication             Safety/Cognition/ Behavioral Observations  Supervision-Mod I  Mod I  recall, complex problem solving, education and d/c planning   Pain   No complaints of pain.  To remain pain free.  Assess pain q shift or prn.   Skin   Skin is intact.   Prevent breakdown from occurring.  Assess skin q shift or prn.     Discharge Planning:  Patient and spouse goal to discharge home   Team Discussion: Patient is impulsive and has trouble with organizational issues and requires redirection at times. Overall;  Doing well physically. Patient on target to meet rehab goals: yes and yes, independent goals  *See Care Plan and progress notes for long and short-term goals.   Revisions to Treatment Plan:   Teaching Needs: Safety awareness due to impulsiveness, re-directional needs for organization  Current Barriers to Discharge: Behavior; patient declines need for follow up care  Possible Resolutions to Barriers: Family education with wife; reinforcement of recommendations for follow up services     Medical Summary Current Status: owels loose due to lactulose  Barriers to Discharge: Other (comments)  Barriers to Discharge Comments: intermittent cognitive def Possible Resolutions to Barriers/Weekly Focus: plan d/c in am   Continued Need for Acute Rehabilitation Level of Care: The patient requires daily medical management by a physician with specialized training in physical medicine and rehabilitation for the following reasons: Direction of a multidisciplinary physical rehabilitation program to maximize functional independence : Yes Medical management of patient stability for increased activity during participation in an intensive rehabilitation regime.: Yes Analysis of laboratory values and/or radiology reports with any subsequent need for medication adjustment and/or medical intervention. : Yes   I attest that I was present, lead the team conference, and concur with the assessment and plan of the team.  Dorien Chihuahua B 05/28/2020, 1:44 PM

## 2020-05-28 NOTE — Progress Notes (Signed)
Speech Language Pathology Discharge Summary  Patient Details  Name: Jimmy Gonzales MRN: [id number] Date of Birth: 07-Nov-1970  Today's Date: 05/28/2020 SLP Individual Time: 7253-6644 SLP Individual Time Calculation (min): 55 min   Skilled Therapeutic Interventions:   Skilled treatment session focused on cognitive goals and completion of family education. SLP facilitated session by providing education to the patient and his wife regarding memory compensatory strategies to utilize at home to maximize recall and carryover of functional information. Both verbalized understanding and a handout was given to reinforce information. SLP also facilitated session by initially providing one verbal cue which faded to Mod I for problem solving during a complex medication management task. Patient left upright in the recliner with wife present.   Patient has met 1 of 3 long term goals.  Patient to discharge at overall Supervision;Modified Independent level.   Reasons goals not met: Patient requires intermittent supervision cues for complex cognitive tasks and did not reach Mod I goals due to short length of stay   Clinical Impression/Discharge Summary: Patient has met 1 of 3 LTGs this admission due to short length of stay. Currently, patient requires overall Mod I-supervision level verbal cues to complete functional and complex tasks safely in regards to problem solving and short-term recall with some intermittent confusion noted, especially at night. Patient and family education is complete and patient will discharge home with assistance from family and feel that f/u SLP intervention is not warranted at this time.   Care Partner:  Caregiver Able to Provide Assistance: Yes  Type of Caregiver Assistance: Physical;Cognitive  Recommendation:  None      Equipment: N/A   Reasons for discharge: Discharged from hospital   Patient/Family Agrees with Progress Made and Goals Achieved: Yes    Giltner,  Stanley 05/28/2020, 3:36 PM

## 2020-05-28 NOTE — Progress Notes (Signed)
Patient ID: Jimmy Gonzales, male   DOB: 04-16-1971, 49 y.o.   MRN: [id number]   Rolling Walker ordered through [employer]

## 2020-05-28 NOTE — Progress Notes (Signed)
Physical Therapy Session Note  Patient Details  Name: Jimmy Gonzales MRN: [id number] Date of Birth: July 22, 1971  Today's Date: 05/28/2020 PT Individual Time: 0930-1005 PT Individual Time Calculation (min): 35 min   Short Term Goals: Week 1:  PT Short Term Goal 1 (Week 1): = to LTGs based on ELOS  Skilled Therapeutic Interventions/Progress Updates:    pt received in recliner and agreeable to therapy. Pt directed in STS from recliner mod I, gait training 200' supervision with VC for inc step length on RLE once. Pt directed in [employer] standing balance training with 5x2 mins LOS program used for visual targets with ankle strategy emphasized to improve balance and dec fall risk, initially min A quickly improved to CGA. Pt directed in x4 mins of catch program at [facility] for similar ankle strategy encouraged to increase stability. Pt directed in picking up  x10 objects from ground level for 30' length of area at supervision, no LOB noted. Pt directed in squat to pick up weighted objects of 6# and 4# (one in each hand) from ground level and ambulate forward and backward 30'x2, supervision; then increased weight to 10# and 6# with forward ambulation 30', 30' lateral squatted steps x2 in R and L direction at supervision. Pt directed in gait training without any weighted resistance to return to room, supervision 200'. Pt left in recliner, alarm belt set, All needs in reach and in good condition. Call light in hand.    Therapy Documentation Precautions:  Precautions Precautions: Fall Restrictions Weight Bearing Restrictions: No   Pain: Pain Assessment Pain Scale: 0-10 Pain Score: 0-No pain    Therapy/Group: Individual Therapy  Junie Panning 05/28/2020, 10:10 AM

## 2020-05-28 NOTE — Progress Notes (Signed)
Patients's bed alarm started going off. By the time this nurse got to the call station to see where the alarm was going off, the room alarm shut off before the bed alarm, so it was suspected that staff was in the room. When the bed alarm still did not shut off, staff went toward his room & this nurse noted the patient was walking down the hall towards the nurses station. When asked where he was going, he stated the bathroom & he couldn't cut off the alarm. Patient had to be redirected, first verbally, then by physical guidance. He was noted to be very confused. The alarm to the room had been shut off but his bed alarm was still sounding.This was the 2nd time this nurse noted the patient had gotten up out of bed. A telemonitor was ordered & the doors to the unit were closed as a precaution due to him being ambulatory & confused. His nurse was informed & patient was assisted in his room to toilet & lay back in bed.

## 2020-05-28 NOTE — Progress Notes (Signed)
Occupational Therapy Discharge Summary  Patient Details  Name: Jimmy Gonzales MRN: [id number] Date of Birth: 06-18-1971   Patient has met 5 of 5 long term goals due to improved attention, improved awareness and improved functional cognition.  Patient to discharge at overall Independent level.  Patient to d/c home with wife who is able to provide assist with IADLs and transportation as needed.   Reasons goals not met: N/A  Recommendation:  Patient does not currently require follow-up OT services at this time.   Equipment: None  Reasons for discharge: treatment goals met and discharge from hospital  Patient/family agrees with progress made and goals achieved: Yes  OT Discharge Precautions/Restrictions  Precautions Precautions: None Restrictions Weight Bearing Restrictions: No General   Vital Signs Therapy Vitals Temp: 98.4 F (36.9 C) Pulse Rate: 87 Resp: 19 BP: 131/79 Patient Position (if appropriate): Lying Oxygen Therapy SpO2: 97 % O2 Device: Room Air Pain   ADL ADL Eating: Independent Where Assessed-Eating: Chair Grooming: Independent Where Assessed-Grooming: Standing at sink Upper Body Bathing: Independent Where Assessed-Upper Body Bathing: Shower Lower Body Bathing: Independent Where Assessed-Lower Body Bathing: Shower Upper Body Dressing: Independent Where Assessed-Upper Body Dressing: Standing at sink Lower Body Dressing: Independent Where Assessed-Lower Body Dressing: Standing at sink Toileting: Independent Where Assessed-Toileting: Glass blower/designer: Programmer, applications Method: Counselling psychologist:  (None) Tub/Shower Transfer: Theatre stage manager Method: Magazine features editor: IT consultant Method: Ambulating Vision Baseline Vision/History: No visual deficits Patient Visual Report: No change from baseline Vision Assessment?: No apparent visual deficits Perception   Perception: Within Functional Limits Praxis Praxis: Intact Cognition Overall Cognitive Status: Within Functional Limits for tasks assessed Arousal/Alertness: Awake/alert Orientation Level: Oriented X4 Attention: Focused;Sustained Focused Attention: Appears intact Sustained Attention: Appears intact Divided Attention: Impaired Divided Attention Impairment: Functional complex Memory: Impaired Memory Impairment: Decreased recall of new information Decreased Short Term Memory: Functional complex Awareness: Appears intact Problem Solving: Appears intact Executive Function: Organizing Sequencing: Appears intact Organizing: Impaired Organizing Impairment: Functional complex Decision Making: Appears intact Initiating: Appears intact Behaviors: Impulsive Safety/Judgment: Appears intact Sensation Sensation Light Touch: Appears Intact Hot/Cold: Not tested Proprioception: Appears Intact Stereognosis: Not tested Coordination Gross Motor Movements are Fluid and Coordinated: Yes Fine Motor Movements are Fluid and Coordinated: Yes Finger Nose Finger Test: WFL R and L Heel Shin Test: WFL R and L Motor  Motor Motor: Within Functional Limits Mobility  Bed Mobility Bed Mobility: Supine to Sit;Sit to Supine;Rolling Left;Rolling Right Supine to Sit: Independent Sit to Supine: Independent Transfers Sit to Stand: Independent Stand to Sit: Independent  Trunk/Postural Assessment  Cervical Assessment Cervical Assessment: Within Functional Limits Thoracic Assessment Thoracic Assessment: Within Functional Limits Lumbar Assessment Lumbar Assessment: Within Functional Limits Postural Control Postural Control: Within Functional Limits  Balance Balance Balance Assessed: Yes Static Sitting Balance Static Sitting - Level of Assistance: 7: Independent Dynamic Sitting Balance Dynamic Sitting - Level of Assistance: 7: Independent Static Standing Balance Static Standing - Balance Support:  No upper extremity supported;During functional activity Static Standing - Level of Assistance: 7: Independent Dynamic Standing Balance Dynamic Standing - Balance Support: No upper extremity supported;During functional activity Dynamic Standing - Level of Assistance: 6: Modified independent (Device/Increase time) Extremity/Trunk Assessment RUE Assessment RUE Assessment: Within Functional Limits LUE Assessment LUE Assessment: Within Functional Limits   Jimmy Gonzales 05/28/2020, 3:32 PM

## 2020-05-28 NOTE — Progress Notes (Signed)
Patient ID: Jimmy Gonzales, male   DOB: 01-29-1971, 48 y.o.   MRN: [id number]  Team Conference Report to Patient/Family  Team Conference discussion was reviewed with the patient and caregiver, including goals, any changes in plan of care and target discharge date.  Patient and caregiver express understanding and are in agreement.  The patient has a target discharge date of 05/29/20.  Dyanne Iha 05/28/2020, 1:43 PM

## 2020-05-28 NOTE — Progress Notes (Signed)
Patient ID: Jimmy Gonzales Age, male   DOB: 07-12-1971, 49 y.o.   MRN: [id number]   Patient refused rolling walker upon delivery with [employer].

## 2020-05-28 NOTE — Plan of Care (Signed)
°  Problem: RH SKIN INTEGRITY Goal: RH STG SKIN FREE OF INFECTION/BREAKDOWN Outcome: Progressing Goal: RH STG ABLE TO PERFORM INCISION/WOUND CARE W/ASSISTANCE Description: STG Able To Perform Incision/Wound Care With Assistance. Outcome: Progressing   Problem: RH SAFETY Goal: RH STG ADHERE TO SAFETY PRECAUTIONS W/ASSISTANCE/DEVICE Description: STG Adhere to Safety Precautions With contact guard Assistance rolling walker Outcome: Progressing Goal: RH STG DECREASED RISK OF FALL WITH ASSISTANCE Description: STG Decreased Risk of Fall With Assistance. Outcome: Progressing   Problem: RH PAIN MANAGEMENT Goal: RH STG PAIN MANAGED AT OR BELOW PT'S PAIN GOAL Description: Free of pain Outcome: Progressing   Problem: RH KNOWLEDGE DEFICIT GENERAL Goal: RH STG INCREASE KNOWLEDGE OF SELF CARE AFTER HOSPITALIZATION Outcome: Progressing   Problem: Consults Goal: RH GENERAL PATIENT EDUCATION Description: See Patient Education module for education specifics. Outcome: Progressing

## 2020-05-29 MED ORDER — CAMPHOR-MENTHOL 0.5-0.5 % EX LOTN: TOPICAL_LOTION | Freq: Three times a day (TID) | CUTANEOUS | 0 refills | Status: DC

## 2020-05-29 MED ORDER — LACTULOSE 10 GM/15ML PO SOLN: 20.0000 g | Freq: Two times a day (BID) | ORAL | 0 refills | Status: AC

## 2020-05-29 MED ORDER — TRAZODONE HCL 50 MG PO TABS: 25.0000 mg | ORAL_TABLET | Freq: Every evening | ORAL | 0 refills | Status: DC | PRN

## 2020-05-29 MED ORDER — PREDNISOLONE 5 MG PO TABS: 40.0000 mg | ORAL_TABLET | Freq: Every day | ORAL | 0 refills | Status: DC

## 2020-05-29 MED ORDER — ACETAMINOPHEN 325 MG PO TABS: 325.0000 mg | ORAL_TABLET | ORAL | Status: DC | PRN

## 2020-05-29 MED ORDER — RIFAXIMIN 550 MG PO TABS: 550.0000 mg | ORAL_TABLET | Freq: Two times a day (BID) | ORAL | 0 refills | Status: AC

## 2020-05-29 MED ORDER — PANTOPRAZOLE SODIUM 40 MG PO TBEC: 40.0000 mg | DELAYED_RELEASE_TABLET | Freq: Two times a day (BID) | ORAL | 0 refills | Status: DC

## 2020-05-29 MED ORDER — LEVOTHYROXINE SODIUM 25 MCG PO TABS: 25.0000 ug | ORAL_TABLET | Freq: Every day | ORAL | 0 refills | Status: AC

## 2020-05-29 MED ORDER — THIAMINE HCL 100 MG PO TABS: 100.0000 mg | ORAL_TABLET | Freq: Every day | ORAL | 0 refills | Status: AC

## 2020-05-29 MED ORDER — POTASSIUM CHLORIDE CRYS ER 20 MEQ PO TBCR: 20.0000 meq | EXTENDED_RELEASE_TABLET | Freq: Every day | ORAL | 0 refills | Status: DC

## 2020-05-29 MED ORDER — AMLODIPINE BESYLATE 5 MG PO TABS: 5.0000 mg | ORAL_TABLET | Freq: Every day | ORAL | 0 refills | Status: AC

## 2020-05-29 NOTE — Discharge Instructions (Signed)
Inpatient Rehab Discharge Instructions  Jimmy Gonzales Discharge date and time: 05/29/20    Activities/Precautions/ Functional Status: Activity: no lifting, driving, or strenuous exercise for till cleared by MD Diet: regular diet Wound Care: none needed    Functional status:  ___ No restrictions     ___ Walk up steps independently ___ 24/7 supervision/assistance   ___ Walk up steps with assistance _X__ Intermittent supervision/assistance  ___ Bathe/dress independently ___ Walk with walker     ___ Bathe/dress with assistance ___ Walk Independently    _X__ Shower independently ___ Walk with assistance    ___ Shower with assistance _X__ No alcohol     ___ Return to work/school ________ COMMUNITY REFERRALS UPON DISCHARGE:    Outpatient: PT     OT                Agency: [facility] Phone: [phone]              Appointment Date/Time: Facility to Schedule Upon Discharge  Medical Equipment/Items Ordered: Rolling Walker (Patient Declined)                                                 Agency/Supplier: [employer]  Special Instructions:    My questions have been answered and I understand these instructions. I will adhere to these goals and the provided educational materials after my discharge from the hospital.  Patient/Caregiver Signature _______________________________ Date __________  Clinician Signature _______________________________________ Date __________  Please bring this form and your medication list with you to all your follow-up doctor's appointments.

## 2020-05-29 NOTE — Progress Notes (Signed)
[facility] PHYSICAL MEDICINE & REHABILITATION PROGRESS NOTE   Subjective/Complaints:  Was mod I in room. Discussed graduated return to driving   Review of systems negative for chest pain shortness of breath nausea vomiting diarrhea constipation  Objective:   No results found. Recent Labs    05/27/20 0615 05/28/20 0732  WBC 25.5* 23.0*  HGB 10.4* 9.9*  HCT 31.9* 31.8*  PLT 349 282   Recent Labs    05/27/20 0104 05/28/20 0732  NA 138 137  K 3.8 4.0  CL 101 99  CO2 26 27  GLUCOSE 125* 164*  BUN 7 9  CREATININE 0.62 0.78  CALCIUM 8.8* 9.2    Intake/Output Summary (Last 24 hours) at 05/29/2020 0824 Last data filed at 05/29/2020 0818 Gross per 24 hour  Intake 994 ml  Output --  Net 994 ml     Physical Exam: Vital Signs Blood pressure 132/80, pulse 96, temperature 98.7 F (37.1 C), resp. rate 16, height 6\' 1"  (1.854 m), weight 94.7 kg, SpO2 97 %.  General: No acute distress Mood and affect are appropriate Heart: Regular rate and rhythm no rubs murmurs or extra sounds Lungs: Clear to auscultation, breathing unlabored, no rales or wheezes Abdomen: Positive bowel sounds, soft nontender to palpation, nondistended Extremities: No clubbing, cyanosis, or edema Skin: No evidence of breakdown, no evidence of rash Neurologic: Cranial nerves II through XII intact, motor strength is 5/5 in bilateral deltoid, bicep, tricep, grip, hip flexor, knee extensors, ankle dorsiflexor and plantar flexor Sensory exam normal sensation to light touch and proprioception in bilateral upper and lower extremities Cerebellar exam normal finger to nose to finger as well as heel to shin in bilateral upper and lower extremities Musculoskeletal: Full range of motion in all 4 extremities. No joint swelling     Assessment/Plan: 1. Functional deficits secondary to debility  Stable for D/C today F/u PCP in 3-4 weeks F/u GI  See D/C summary See D/C instructions Care Tool:  Bathing    Body  parts bathed by patient: Right arm, Right upper leg, Left arm, Left upper leg, Chest, Right lower leg, Abdomen, Front perineal area, Left lower leg, Buttocks, Face         Bathing assist Assist Level: Independent     Upper Body Dressing/Undressing Upper body dressing   What is the patient wearing?: Pull over shirt    Upper body assist Assist Level: Independent    Lower Body Dressing/Undressing Lower body dressing      What is the patient wearing?: Underwear/pull up     Lower body assist Assist for lower body dressing: Independent     Toileting Toileting    Toileting assist Assist for toileting: Independent     Transfers Chair/bed transfer  Transfers assist     Chair/bed transfer assist level: Independent     Locomotion Ambulation   Ambulation assist      Assist level: Contact Guard/Touching assist Assistive device: No Device Max distance: 373ft   Walk 10 feet activity   Assist     Assist level: Contact Guard/Touching assist Assistive device: No Device   Walk 50 feet activity   Assist    Assist level: Contact Guard/Touching assist Assistive device: No Device    Walk 150 feet activity   Assist    Assist level: Contact Guard/Touching assist Assistive device: No Device    Walk 10 feet on uneven surface  activity   Assist     Assist level: Contact Guard/Touching assist Assistive device: Other (comment) (  none)   Wheelchair     Assist Will patient use wheelchair at discharge?: No             Wheelchair 50 feet with 2 turns activity    Assist            Wheelchair 150 feet activity     Assist          Blood pressure 132/80, pulse 96, temperature 98.7 F (37.1 C), resp. rate 16, height 6\' 1"  (1.854 m), weight 94.7 kg, SpO2 97 %.  Medical Problem List and Plan: 1.Functional and mobility deficitssecondary to hemorrhagic shock d/t esophageal bleed/cirrhosis of liver s/p embolization, TIPS procedure  with associated hepatic encephalopathy -patient may shower D/c home with wife Counseled abstinence from ETOH, pt states no ETOH in house anymore   Graduated return to driving instructions were provided. It is recommended that the patient first drives with another licensed driver in an empty parking lot. If the patient does well with this, and they can drive on a quiet street with the licensed driver. If the patient does well with this they can drive on a busy street with a licensed driver. If the patient does well with this, the next time out they can go by himself. For the first month after resuming driving, I recommend no nighttime or Interstate driving.    2. Antithrombotics: -DVT/anticoagulation:Mechanical:Sequential compression devices, below kneeBilateral lower extremities -antiplatelet therapy: N/A 3. Pain Management:Oxycodone prn?--may need to d/c if confusion continues. 4. Mood:LCSW to follow for evaluation and support. -antipsychotic agents: N/A 5. Neuropsych: This patientIs not fullycapable of making decisions onhisown behalf but he's approaching baseline. -cognition has improved immensely .  6.Rash//Skin/Wound Care:Monitor I/O. Routine pressure relief measures.Will add Sarna to help manage symptoms. 7. Fluids/Electrolytes/Nutrition:Monitor I/O.Check lytes in am. 8. ABLA/GIB s/p TIPS: Continue to monitor H/H with serial checks. Transfuse prn Hgb<7.0.  9. Decompensated Cirrhosis/Hepatic encephalopathy: Ammonia level 132-->recheck in am. -Continue PPI bid -lactulose 20 gram bid -Rifaximin.Has completed 10/10 days Rocephin on 08/29. -To continue prednisone 40 mg daily X 28 days then d/c per GI.no wean per Dr Therisa Doyne 10. Hypokalemia:Multifactorial. Resolved with runs of K+---will add Kdur 20 meq dailyfor supplementas starting to trend down again--potassium normal  at 3.8 11. HTN: BP trending up-->Norvasc added 08/25.Clonidineresumed Vitals:   05/28/20 1919 05/29/20 0450  BP: 134/73 132/80  Pulse: 70 96  Resp: 18 16  Temp: 98.5 F (36.9 C) 98.7 F (37.1 C)  SpO2: 78% 24%  min systolic elevation , cont current meds  12. Leucocytosis: Likely due to steroids -->monitor for signs of infection. Low-grade temp resolved received MODERNA vaccine 8/30     LOS: 3 days A FACE TO FACE EVALUATION WAS PERFORMED  Charlett Blake 05/29/2020, 8:24 AM

## 2020-05-29 NOTE — Progress Notes (Signed)
Patient discharged home with wife, all belongings sent home. Escorted out by Nurse and NT

## 2020-05-29 NOTE — Progress Notes (Signed)
Inpatient Rehabilitation Care Coordinator  Discharge Note  The overall goal for the admission was met for:   Discharge location: Yes, home  Length of Stay: Yes, 3 Days  Discharge activity level: Yes, independent   Home/community participation: Yes  Services provided included: MD, RD, PT, OT, SLP, RN, CM, TR, Pharmacy and SW  Financial Services: Private Insurance: [employer] PPO  Follow-up services arranged: Outpatient: [facility]  Comments (or additional information): PT OT Patient declined DME  Patient/Family verbalized understanding of follow-up arrangements: Yes  Individual responsible for coordination of the follow-up plan: Anne Ng [phone]  Confirmed correct DME delivered: Dyanne Iha 05/29/2020    Dyanne Iha

## 2020-05-29 NOTE — Plan of Care (Signed)
  Problem: RH SKIN INTEGRITY Goal: RH STG SKIN FREE OF INFECTION/BREAKDOWN Outcome: Completed/Met Goal: RH STG ABLE TO PERFORM INCISION/WOUND CARE W/ASSISTANCE Description: STG Able To Perform Incision/Wound Care With Assistance. Outcome: Completed/Met   Problem: RH SAFETY Goal: RH STG ADHERE TO SAFETY PRECAUTIONS W/ASSISTANCE/DEVICE Description: STG Adhere to Safety Precautions With contact guard Assistance rolling walker Outcome: Completed/Met Goal: RH STG DECREASED RISK OF FALL WITH ASSISTANCE Description: STG Decreased Risk of Fall With Assistance. Outcome: Completed/Met   Problem: RH PAIN MANAGEMENT Goal: RH STG PAIN MANAGED AT OR BELOW PT'S PAIN GOAL Description: Free of pain Outcome: Completed/Met   Problem: RH KNOWLEDGE DEFICIT GENERAL Goal: RH STG INCREASE KNOWLEDGE OF SELF CARE AFTER HOSPITALIZATION Outcome: Completed/Met   Problem: Consults Goal: RH GENERAL PATIENT EDUCATION Description: See Patient Education module for education specifics. Outcome: Completed/Met

## 2020-05-30 NOTE — Progress Notes (Signed)
Overall Plan of Care [facility]) Patient Details Name: Jimmy Gonzales MRN: [id number] DOB: 1971/06/22  Admitting Diagnosis: <principal problem not specified>  Hospital Problems: Active Problems:   Hepatic encephalopathy ([place])     Functional Problem List: Nursing Edema, Endurance, Medication Management, Skin Integrity  PT Balance, Safety, Endurance, Motor, Nutrition, Pain  OT Balance, Cognition  SLP Cognition  TR         Basic ADL's: OT Bathing     Advanced  ADL's: OT Simple Meal Preparation     Transfers: PT Bed Mobility, Bed to Chair, Car, Furniture, Floor  OT Tub/Shower     Locomotion: PT Ambulation, Stairs     Additional Impairments: OT    SLP Social Cognition   Attention, Memory, Problem Solving  TR      Anticipated Outcomes Item Anticipated Outcome  Self Feeding    Swallowing      Basic self-care  independent  Toileting  independent   Bathroom Transfers independent  Bowel/Bladder  continent of B/B  Transfers  mod-I  Locomotion  mod-I  Communication     Cognition  Mod I  Pain  Free of pain  Safety/Judgment  Will remain free of injury while in CIR   Therapy Plan: PT Intensity: Minimum of 1-2 x/day ,45 to 90 minutes PT Frequency: 5 out of 7 days PT Duration Estimated Length of Stay: 1-3 days OT Intensity: Minimum of 1-2 x/day, 45 to 90 minutes OT Frequency: 5 out of 7 days OT Duration/Estimated Length of Stay: 1-3 days SLP Intensity: Minumum of 1-2 x/day, 30 to 90 minutes SLP Frequency: 3 to 5 out of 7 days SLP Duration/Estimated Length of Stay: 3-5 days   Due to the current state of emergency, patients may not be receiving their 3-hours of Medicare-mandated therapy.   Team Interventions: Nursing Interventions Patient/Family Education, Medication Management, Skin Care/Wound Management, Disease Management/Prevention, Psychosocial Support, Discharge Planning  PT interventions Ambulation/gait training, Community reintegration, DME/adaptive  equipment instruction, Neuromuscular re-education, Psychosocial support, Stair training, UE/LE Strength taining/ROM, Training and development officer, Discharge planning, Functional electrical stimulation, Pain management, Skin care/wound management, Therapeutic Activities, UE/LE Coordination activities, Cognitive remediation/compensation, Disease management/prevention, Functional mobility training, Patient/family education, Splinting/orthotics, Therapeutic Exercise, Visual/perceptual remediation/compensation  OT Interventions Training and development officer, Patient/family education, Self Care/advanced ADL retraining, Therapeutic Exercise, Cognitive remediation/compensation, Discharge planning, DME/adaptive equipment instruction, Functional mobility training, Therapeutic Activities  SLP Interventions Cognitive remediation/compensation, Cueing hierarchy, Environmental controls, Therapeutic Activities, Functional tasks, Patient/family education, Internal/external aids  TR Interventions    SW/CM Interventions Discharge Planning, Psychosocial Support, Patient/Family Education   Barriers to Discharge MD  Medical stability  Nursing      PT      OT      SLP      SW Other (comments) Follow up Compliance   Team Discharge Planning: Destination: PT-Home ,OT- Home , SLP-Home Projected Follow-up: PT-Outpatient PT, OT-  None, SLP-None Projected Equipment Needs: PT-None recommended by PT, OT- To be determined, SLP-None recommended by SLP Equipment Details: PT- , OT-  Patient/family involved in discharge planning: PT- Patient, Family member/caregiver,  OT-Patient, SLP-Patient, Family member/caregiver  MD ELOS: 5d Medical Rehab Prognosis:  Excellent Assessment:  49 year old male with history of cirrhosis of liver, alcohol abuse who was admitted on via [facility] 05/15/20 with massive hematemesis with hemorrhagic shock and inability to treat bleeding. He required emergent TIPS with embolization of variceal trunks to  esophageal varices by Dr. Linard Millers. He was required 2 units PRBC and OGT. He had frank blood via OGT as well as  rectal tube as hypotension with bouts of agitation due to hepatic encephalopathy--ammonia level 154. neprology consulted for input on AKI and recommended supportive care. He tolerated extubation by 08/23 and on regular diet as no signs of dysphagia.   Dr. Cristina Gong following for input and recommended monitoring of H/H as well as PPI bid, Lactulose, corticosteroids and Xifaxan. To complete 10 day course of Rocephin for SB On SBO noted but tolerating po's with liquid stools. He developed abdominal pain due to progressive distension and abdominal ultrasound without significant ascites.He continuedto have confusion with leucocytosis--WBC up to 21.9today, hypokalemia, cognitive deficits as well as unsteady gait with high fall risk. Required haldol and Seroquel-->Seroquel has been discontinued due to incontinence/wife's concerns. Therapy on going and patient with balance deficits as well as difficulty with higher level cognitive tasks.    See Team Conference Notes for weekly updates to the plan of care

## 2020-06-01 DIAGNOSIS — D72829 Elevated white blood cell count, unspecified: Secondary | ICD-10-CM

## 2020-06-01 DIAGNOSIS — D62 Acute posthemorrhagic anemia: Secondary | ICD-10-CM

## 2020-06-01 DIAGNOSIS — E876 Hypokalemia: Secondary | ICD-10-CM

## 2020-06-19 ENCOUNTER — Other Ambulatory Visit: Payer: Self-pay | Admitting: Interventional Radiology

## 2020-06-19 DIAGNOSIS — K922 Gastrointestinal hemorrhage, unspecified: Secondary | ICD-10-CM

## 2020-07-03 ENCOUNTER — Other Ambulatory Visit: Payer: Self-pay | Admitting: Gastroenterology

## 2020-07-08 ENCOUNTER — Ambulatory Visit
Admission: RE | Admit: 2020-07-08 | Discharge: 2020-07-08 | Disposition: A | Discharge status: Home / Self Care | Payer: BC Managed Care – PPO | Source: Ambulatory Visit | Attending: Interventional Radiology | Admitting: Interventional Radiology

## 2020-07-08 ENCOUNTER — Ambulatory Visit
Admission: RE | Admit: 2020-07-08 | Discharge: 2020-07-08 | Disposition: A | Discharge status: Home / Self Care | Payer: BC Managed Care – PPO | Source: Ambulatory Visit | Attending: Student | Admitting: Student

## 2020-07-08 ENCOUNTER — Encounter: Payer: Self-pay | Admitting: *Deleted

## 2020-07-08 DIAGNOSIS — K7460 Unspecified cirrhosis of liver: Secondary | ICD-10-CM | POA: Diagnosis not present

## 2020-07-08 DIAGNOSIS — Z9689 Presence of other specified functional implants: Secondary | ICD-10-CM | POA: Diagnosis not present

## 2020-07-08 DIAGNOSIS — K922 Gastrointestinal hemorrhage, unspecified: Secondary | ICD-10-CM

## 2020-07-08 DIAGNOSIS — I8501 Esophageal varices with bleeding: Secondary | ICD-10-CM | POA: Diagnosis not present

## 2020-07-08 DIAGNOSIS — K7030 Alcoholic cirrhosis of liver without ascites: Secondary | ICD-10-CM | POA: Diagnosis not present

## 2020-07-08 HISTORY — PX: IR RADIOLOGIST EVAL & MGMT: IMG5224

## 2020-07-08 NOTE — Progress Notes (Addendum)
Chief Complaint: Patient was seen in consultation today for follow up after TIPS.  History of Present Illness: Jimmy Gonzales is a 49 y.o. male with a history of alcoholic liver disease and acute bleeding esophageal varices. He had persistent bleeding despite endoscopic variceal banding and critical illness with hypotension and shock requiring blood transfusion, pressor therapy and intubation. He was emergently transferred from [facility] to [facility] for a TIPS procedure that also included extensive variceal embolization on 05/15/2020.  After a hospital stay that included inpatient rehab, he was discharged on 05/29/2020 to home.  He has done quite well after discharge and has followed up with Dr. Therisa Doyne who has scheduled follow-up EGD and colonoscopy at the end of October.  He is on lactulose and Xifaxan and denies any confusion or trouble with activities of daily living.  He denies any vomiting of blood, blood in his stool or melena.  He and his wife state that Dr. Therisa Doyne is also going to be referring him to a hepatologist.  Past Medical History:  Diagnosis Date  . HTN (hypertension)   . Hypothyroid     Past Surgical History:  Procedure Laterality Date  . IR ANGIOGRAM SELECTIVE EACH ADDITIONAL VESSEL  05/16/2020  . IR ANGIOGRAM SELECTIVE EACH ADDITIONAL VESSEL  05/16/2020  . IR ANGIOGRAM SELECTIVE EACH ADDITIONAL VESSEL  05/16/2020  . IR EMBO ART  VEN HEMORR LYMPH EXTRAV  INC GUIDE ROADMAPPING  05/16/2020  . IR RADIOLOGIST EVAL & MGMT  07/08/2020  . IR TIPS  05/16/2020  . RADIOLOGY WITH ANESTHESIA N/A 05/15/2020   Procedure: TIPS PROCEDURE;  Surgeon: Radiologist, Medication, MD;  Location: [place];  Service: Radiology;  Laterality: N/A;    Allergies: Patient has no known allergies.  Medications: Prior to Admission medications   Medication Sig Start Date End Date Taking? Authorizing Provider  acetaminophen (TYLENOL) 325 MG tablet Take 1-2 tablets (325-650 mg total) by mouth  every 4 (four) hours as needed for mild pain. 05/29/20   Love, Ivan Anchors, PA-C  amLODipine (NORVASC) 5 MG tablet Take 1 tablet (5 mg total) by mouth daily. 05/29/20   Love, Ivan Anchors, PA-C  camphor-menthol [facility]) lotion Apply topically 4 (four) times daily -  with meals and at bedtime. 05/29/20   Love, Ivan Anchors, PA-C  lactulose (CHRONULAC) 10 GM/15ML solution Take 30 mLs (20 g total) by mouth 2 (two) times daily. 05/29/20   Love, Ivan Anchors, PA-C  levothyroxine (SYNTHROID) 25 MCG tablet Take 1 tablet (25 mcg total) by mouth daily at 6 (six) AM. 05/29/20   Love, Ivan Anchors, PA-C  Naphazoline-Pheniramine (OPCON-A) 0.027-0.315 % SOLN Place 1 drop into both eyes daily as needed (dry eyes/irritation).    [provider]  pantoprazole (PROTONIX) 40 MG tablet Take 1 tablet (40 mg total) by mouth 2 (two) times daily. 05/29/20   Love, Ivan Anchors, PA-C  potassium chloride SA (KLOR-CON) 20 MEQ tablet Take 1 tablet (20 mEq total) by mouth daily. 05/29/20   Love, Ivan Anchors, PA-C  prednisoLONE 5 MG TABS tablet Take 8 tablets (40 mg total) by mouth daily. 05/29/20   Love, Ivan Anchors, PA-C  rifaximin (XIFAXAN) 550 MG TABS tablet Take 1 tablet (550 mg total) by mouth 2 (two) times daily. 05/29/20   Love, Ivan Anchors, PA-C  thiamine 100 MG tablet Take 1 tablet (100 mg total) by mouth daily. 05/29/20   Love, Ivan Anchors, PA-C  traZODone (DESYREL) 50 MG tablet Take 0.5-1 tablets (25-50 mg total) by mouth  at bedtime as needed for sleep. 05/29/20   Bary Leriche, PA-C     Family History  Problem Relation Age of Onset  . Lung cancer Mother   . Congestive Heart Failure Father     Social History   Socioeconomic History  . Marital status: Married    Spouse name: Bay Wayson   . Number of children: Not on file  . Years of education: Not on file  . Highest education level: Not on file  Occupational History  . Not on file  Tobacco Use  . Smoking status: Unknown If Ever Smoked  . Smokeless tobacco: Current User    Types: Chew    Substance and Sexual Activity  . Alcohol use: Yes    Comment: "drinks 1 gallon of booze a day"  . Drug use: Not on file  . Sexual activity: Not on file  Other Topics Concern  . Not on file  Social History Narrative  . Not on file   Social Determinants of Health   Financial Resource Strain:   . Difficulty of Paying Living Expenses: Not on file  Food Insecurity:   . Worried About Charity fundraiser in the Last Year: Not on file  . Ran Out of Food in the Last Year: Not on file  Transportation Needs:   . Lack of Transportation (Medical): Not on file  . Lack of Transportation (Non-Medical): Not on file  Physical Activity:   . Days of Exercise per Week: Not on file  . Minutes of Exercise per Session: Not on file  Stress:   . Feeling of Stress : Not on file  Social Connections:   . Frequency of Communication with Friends and Family: Not on file  . Frequency of Social Gatherings with Friends and Family: Not on file  . Attends Religious Services: Not on file  . Active Member of Clubs or Organizations: Not on file  . Attends Archivist Meetings: Not on file  . Marital Status: Not on file    Review of Systems: A 12 point ROS discussed and pertinent positives are indicated in the HPI above.  All other systems are negative.  Review of Systems  Constitutional: Negative.   Respiratory: Negative.   Cardiovascular: Negative.   Gastrointestinal: Negative.   Genitourinary: Negative.   Musculoskeletal: Negative.   Neurological: Negative.     Vital Signs: There were no vitals taken for this visit.  Physical Exam Constitutional:      General: He is not in acute distress.    Appearance: He is not ill-appearing, toxic-appearing or diaphoretic.  Abdominal:     General: There is no distension.     Palpations: Abdomen is soft.  Musculoskeletal:        General: No swelling.  Skin:    General: Skin is warm and dry.     Coloration: Skin is not jaundiced.  Neurological:      General: No focal deficit present.     Mental Status: He is alert and oriented to person, place, and time.     Imaging: US ABDOMINAL PELVIC ART/VENT FLOW DOPPLER  Result Date: 07/08/2020 CLINICAL DATA:  History of cirrhosis, esophageal variceal bleed and status post TIPS procedure with embolization of varices on 05/16/2020. EXAM: DUPLEX ULTRASOUND OF LIVER AND TIPS SHUNT TECHNIQUE: Color and duplex Doppler ultrasound was performed to evaluate the hepatic in-flow and out-flow vessels. COMPARISON:  Imaging from the TIPS procedure on 05/16/2020. FINDINGS: Portal Vein Velocities Main:  52 cm/sec Right:  116 cm/sec Left:  35 cm/sec TIPS Stent Velocities Proximal:  116 cm/sec Mid:  109 Distal:  139 cm/sec IVC: Present and patent with normal respiratory phasicity. Hepatic Vein Velocities Right:  139 cm/sec Mid:  25 cm/sec Left:  44 cm/sec Splenic Vein: 20 Superior Mesenteric Vein: Not visualized. Hepatic Artery: 142 Ascites: Absent Varices: None visualized. Other findings: Rounded echogenic area within the inferior right lobe of the liver measuring approximately 1.7 cm in greatest diameter is felt to most likely represent a hemangioma. IMPRESSION: Widely patent TIPS shunt with normal velocities and waveforms. Probable hemangioma in the inferior right lobe of the liver measuring approximately 1.7 cm in greatest diameter. Electronically Signed   By: Aletta Edouard M.D.   On: 07/08/2020 11:26   IR Radiologist Eval & Mgmt  Result Date: 07/08/2020 Please refer to notes tab for details about interventional procedure. (Op Note)   Labs:  CBC: Recent Labs    05/26/20 0209 05/27/20 0104 05/27/20 0615 05/28/20 0732  WBC 21.9* 21.4* 25.5* 23.0*  HGB 9.4* 9.1* 10.4* 9.9*  HCT 29.3* 28.7* 31.9* 31.8*  PLT 257 237 349 282    COAGS: Recent Labs    05/16/20 0019 05/16/20 1430 05/17/20 0453 05/17/20 1819 05/18/20 0500 05/18/20 1820  INR 2.5*  2.5*   < > 1.8* 1.7* 1.6* 1.5*  APTT 39*  --   --   --    --   --    < > = values in this interval not displayed.    BMP: Recent Labs    05/26/20 0209 05/26/20 2019 05/27/20 0104 05/28/20 0732  NA 140 138 138 137  K 3.5 3.8 3.8 4.0  CL 102 99 101 99  CO2 '25 29 26 27  ' GLUCOSE 99 138* 125* 164*  BUN '8 6 7 9  ' CALCIUM 8.7* 9.0 8.8* 9.2  CREATININE 0.70 0.70 0.62 0.78  GFRNONAA >60 >60 >60 >60  GFRAA >60 >60 >60 >60    LIVER FUNCTION TESTS: Recent Labs    05/22/20 0444 05/24/20 0102 05/26/20 0209 05/27/20 0104  BILITOT 6.3* 4.5* 4.0* 4.5*  AST 138* 126* 125* 126*  ALT 69* 73* 79* 89*  ALKPHOS 173* 187* 192* 208*  PROT 6.7 6.6 6.4* 6.6  ALBUMIN 2.7* 2.7* 2.5* 2.6*    Assessment and Plan:  I met with Mr. Siler and his wife.  We were able to review imaging from his TIPS procedure today as he was critically ill at the time of the procedure and during much of his hospitalization.    TIPS duplex ultrasound today demonstrates a widely patent TIPS shunt within the liver with normal velocities and waveforms.  There is no evidence of ascites.  The portal vein and hepatic veins are normally patent.  I recommended a follow-up TIPS duplex ultrasound in 6 months.  He will follow-up with Dr. Therisa Doyne.  I will plan to see him after his next TIPS ultrasound.   Electronically Signed: Azzie Roup 07/08/2020, 11:12 AM     I spent a total of 15 Minutes in face to face in clinical consultation, greater than 50% of which was counseling/coordinating care post TIPS.

## 2020-07-22 ENCOUNTER — Other Ambulatory Visit (HOSPITAL_COMMUNITY)
Admission: RE | Admit: 2020-07-22 | Discharge: 2020-07-22 | Disposition: A | Discharge status: Home / Self Care | Payer: BC Managed Care – PPO | Source: Ambulatory Visit | Attending: Gastroenterology | Admitting: Gastroenterology

## 2020-07-22 DIAGNOSIS — Z01812 Encounter for preprocedural laboratory examination: Secondary | ICD-10-CM | POA: Insufficient documentation

## 2020-07-22 DIAGNOSIS — Z20822 Contact with and (suspected) exposure to covid-19: Secondary | ICD-10-CM | POA: Insufficient documentation

## 2020-07-22 LAB — SARS CORONAVIRUS 2 (TAT 6-24 HRS): SARS Coronavirus 2: NEGATIVE

## 2020-07-24 NOTE — H&P (Signed)
History of Present Illness  General:          49 year old male was admitted recently with encephalopathy.        He was initially transferred from [facility] on 05/15/20 with massive hematemesis and hemorrhagic shock, requiring blood transfusions, IV processors and intubation,required emergent TIPS by IR at [facility](05/16/20), was in the ICU and intubated for a few days, ultimately discharged on lactulose, Xifaxan, prednisolone for 28 days and was admitted to rehab for inpatient therapy and subsequently discharged. He was discharged on lactulose 30 ML/20 grams BID, Protonix 40 milligrams BID, Xifaxan 550 milligrams BID, timing        Ultrasound from 05/24/20 4 history of alcohol related cirrhosis, recent tips for variceal bleeding showed no significant ascites.        Labs from 05/28/20 showed normal electrolytes, creatinine 0.78, total belief 4.5, AST 126, ALT 18 9, ALP 208, WBC 23, hemoglobin 9.9, platelet 282. PT/INR was 17.9/1.5 on 05/18/20.        He c/o itching, it started in the hospital and feels that is related to [place].He c/o itch spots and he has bumps on his sides and chest and back.        He has a BM 1-2/day, there has been no confusion or episodes of disorientation.        Denies blood in stool or black stools.        No prior colonoscopy.        He has labs with his PCP at 06/12/20([facility]).     Current Medications  Taking  .Lactulose 20 GM/30ML Solution 51ml Orally Twice a day    .Protonix(Pantoprazole Sodium) 40 MG Tablet Delayed Release 1 tablet Orally Twice a day    .Xifaxan 550 MG tablet 1 tablet Orally Twice a day          Past Medical History        06/2020 Cirrhosis .        Surgical History         TIPS 04/2020       Family History   Father: deceased   Mother: deceased   Negative Family history on colon cancer, poylps and liver Disease.       Social History  General:   Tobacco use       cigarettes: Never smoked    Tobacco history last updated 07/03/2020 no Alcohol.  no Recreational drug use.      Allergies   Xifaxan: rash - Allergy       Hospitalization/Major Diagnostic Procedure   Pt was in hospital for surgery 05/15/2020       Review of Systems  GI PROCEDURE:         no Pacemaker/ AICD, no. no Artificial heart valves. no MI/heart attack. no Abnormal heart rhythm. no Angina. no CVA. Hypertension  YES. no Hypotension. no Asthma, COPD. no Sleep apnea. no Seizure disorders. no Artificial joints. no Severe DJD. no Diabetes. no Significant headaches. no Vertigo. no Depression/anxiety. no Abnormal bleeding. no Kidney Disease. Liver disease  yes, cirrhosis . Blood transfusion  yes, ^6 units of blood while in hosiptial on 04/2020.          Vital Signs  Wt 228.2, Ht 73, BMI 30.10, Temp 98.6, Pulse sitting 102, BP sitting 164/84.     Examination  Gastroenterology::       GENERAL APPEARANCE: Well developed, well nourished, no active distress, pleasant.  SCLERA: anicteric.        CARDIOVASCULAR mild tachycardia.        RESPIRATORY Breath sounds normal. Respiration even and unlabored.        ABDOMEN No masses palpated. Liver and spleen not palpated, normal. Bowel sounds normal, Abdomen not distended.        EXTREMITIES: No edema.        NEURO: alert, oriented to time, place and person, normal gait, no asterixis.        PSYCH: mood/affect normal.       Assessments     1. Alcoholic cirrhosis of liver without ascites - K70.30 (Primary)   2. Screen for colon cancer - Z12.11   3. History of alcohol abuse - F10.11   4. Itching - L29.9     Treatment   1. Alcoholic cirrhosis of liver without ascites        IMAGING: Esophagoscopy Notes: Will set him up for an EGD with possible banding of varices if needed at [facility]. The risks and the benefits of the procedure were discussed with the patient in details. He understands and verbalizes consent. He wil be getting  labs with his PCP on 07/12/20, I have requested for CBC/CMP/PT/INR/AFP to be drawn at the same time. Advised to continue lactulose 20 gm/30 ml BID and xifaxan 550 mg BID. Currently has no signs of ascites/encephalopathy.      2. Screen for colon cancer        IMAGING: Colonoscopy Notes: Will schedule for colonoscopy at the same time as his EGD. The patient will be given written instrutions, prescription for preparation and will be scheduled for the same.      3. History of alcohol abuse   Notes: Patient and his wife report that use of alcohol was 05/12/20. Prior to that he was driking 1/5th of liquor daily for 3 years, prior to that he was drinking beer daily from the age of 13. I have discussed with them that he needs to remain abstinent from alcohol use for at least 6 months, before being considered for evaluation for liver transplant if needed. Perhaps, on his next visit with me in 3 months, I can refer him to [facility] or [facility].      4. Itching   Start hydrOXYzine HCl Tablet, 25 MG, 1 tablet as needed, Orally, every 8 hrs, 30 day(s), 90, Refills 0 Notes: I have advised patient to take hydroxyzine as needed TID for pruritus/itching.

## 2020-07-25 ENCOUNTER — Encounter (HOSPITAL_COMMUNITY)
Admission: RE | Disposition: A | Discharge status: Home / Self Care | Payer: Self-pay | Source: Home / Self Care | Attending: Gastroenterology

## 2020-07-25 ENCOUNTER — Ambulatory Visit (HOSPITAL_COMMUNITY): Payer: BC Managed Care – PPO | Admitting: Anesthesiology

## 2020-07-25 ENCOUNTER — Ambulatory Visit (HOSPITAL_COMMUNITY)
Admission: RE | Admit: 2020-07-25 | Discharge: 2020-07-25 | Disposition: A | Discharge status: Home / Self Care | Payer: BC Managed Care – PPO | Attending: Gastroenterology | Admitting: Gastroenterology

## 2020-07-25 ENCOUNTER — Encounter (HOSPITAL_COMMUNITY): Payer: Self-pay | Admitting: Gastroenterology

## 2020-07-25 ENCOUNTER — Other Ambulatory Visit: Payer: Self-pay

## 2020-07-25 DIAGNOSIS — K3189 Other diseases of stomach and duodenum: Secondary | ICD-10-CM | POA: Insufficient documentation

## 2020-07-25 DIAGNOSIS — K7030 Alcoholic cirrhosis of liver without ascites: Secondary | ICD-10-CM | POA: Diagnosis not present

## 2020-07-25 DIAGNOSIS — I8510 Secondary esophageal varices without bleeding: Secondary | ICD-10-CM | POA: Diagnosis not present

## 2020-07-25 DIAGNOSIS — Z888 Allergy status to other drugs, medicaments and biological substances status: Secondary | ICD-10-CM | POA: Insufficient documentation

## 2020-07-25 DIAGNOSIS — K766 Portal hypertension: Secondary | ICD-10-CM | POA: Insufficient documentation

## 2020-07-25 DIAGNOSIS — K7460 Unspecified cirrhosis of liver: Secondary | ICD-10-CM | POA: Diagnosis not present

## 2020-07-25 DIAGNOSIS — I10 Essential (primary) hypertension: Secondary | ICD-10-CM | POA: Diagnosis not present

## 2020-07-25 DIAGNOSIS — D62 Acute posthemorrhagic anemia: Secondary | ICD-10-CM | POA: Diagnosis not present

## 2020-07-25 DIAGNOSIS — I8500 Esophageal varices without bleeding: Secondary | ICD-10-CM | POA: Diagnosis not present

## 2020-07-25 HISTORY — PX: ESOPHAGEAL BANDING: SHX5518

## 2020-07-25 HISTORY — DX: Alcoholic cirrhosis of liver without ascites: K70.30

## 2020-07-25 HISTORY — PX: ESOPHAGOGASTRODUODENOSCOPY (EGD) WITH PROPOFOL: SHX5813

## 2020-07-25 SURGERY — ESOPHAGOGASTRODUODENOSCOPY (EGD) WITH PROPOFOL
Anesthesia: Monitor Anesthesia Care

## 2020-07-25 MED ORDER — LIDOCAINE HCL (CARDIAC) PF 100 MG/5ML IV SOSY
PREFILLED_SYRINGE | INTRAVENOUS | Status: DC | PRN
  Administered 2020-07-25: 60 mg via INTRAVENOUS

## 2020-07-25 MED ORDER — PROPOFOL 500 MG/50ML IV EMUL
INTRAVENOUS | Status: DC | PRN
  Administered 2020-07-25: 150 ug/kg/min via INTRAVENOUS

## 2020-07-25 MED ORDER — SODIUM CHLORIDE 0.9 % IV SOLN: INTRAVENOUS | Status: DC

## 2020-07-25 MED ORDER — PROPOFOL 10 MG/ML IV BOLUS
INTRAVENOUS | Status: DC | PRN
  Administered 2020-07-25: 30 mg via INTRAVENOUS
  Administered 2020-07-25: 40 mg via INTRAVENOUS
  Administered 2020-07-25: 3 mg via INTRAVENOUS

## 2020-07-25 MED ORDER — DEXMEDETOMIDINE (PRECEDEX) IN NS 20 MCG/5ML (4 MCG/ML) IV SYRINGE
PREFILLED_SYRINGE | INTRAVENOUS | Status: DC | PRN
  Administered 2020-07-25 (×3): 8 ug via INTRAVENOUS
  Administered 2020-07-25: 4 ug via INTRAVENOUS

## 2020-07-25 SURGICAL SUPPLY — 25 items

## 2020-07-25 NOTE — Transfer of Care (Signed)
Immediate Anesthesia Transfer of Care Note  Patient: Jimmy Gonzales  Procedure(s) Performed: ESOPHAGOGASTRODUODENOSCOPY (EGD) WITH PROPOFOL WITH BANDING (N/A )  Patient Location: Endoscopy Unit  Anesthesia Type:MAC  Level of Consciousness: awake  Airway & Oxygen Therapy: Patient Spontanous Breathing and Patient connected to face mask oxygen  Post-op Assessment: Report given to RN and Post -op Vital signs reviewed and stable  Post vital signs: Reviewed and stable  Last Vitals:  Vitals Value Taken Time  BP 144/86 07/25/20 1030  Temp 36.6 C 07/25/20 1010  Pulse 87 07/25/20 1030  Resp 14 07/25/20 1030  SpO2 95 % 07/25/20 1030    Last Pain:  Vitals:   07/25/20 1030  TempSrc:   PainSc: 0-No pain         Complications: No complications documented.

## 2020-07-25 NOTE — Op Note (Signed)
[facility] Patient Name: Jimmy Gonzales Procedure Date: 07/25/2020 MRN: [id number] Attending MD: Ronnette Juniper , MD Date of Birth: 07-10-1971 CSN: [id number] Age: 49 Admit Type: Outpatient Procedure:                Upper GI endoscopy Indications:              For therapy of esophageal varices, history of life                            threatening esophageal variceal bleed requring                            emergent TIPS, history of alcohol related cirrhosis Providers:                Ronnette Juniper, MD, Erenest Rasher, RN, Cleda Daub,                            RN, Cletis Athens, Technician Referring MD:             Josie Dixon Medicines:                Monitored Anesthesia Care Complications:            No immediate complications. Estimated blood loss:                            Minimal. Estimated Blood Loss:     Estimated blood loss was minimal. Procedure:                Pre-Anesthesia Assessment:                           - Prior to the procedure, a History and Physical                            was performed, and patient medications and                            allergies were reviewed. The patient's tolerance of                            previous anesthesia was also reviewed. The risks                            and benefits of the procedure and the sedation                            options and risks were discussed with the patient.                            All questions were answered, and informed consent                            was obtained. Prior Anticoagulants: The patient has  taken no previous anticoagulant or antiplatelet                            agents. ASA Grade Assessment: III - A patient with                            severe systemic disease. After reviewing the risks                            and benefits, the patient was deemed in                            satisfactory condition to undergo the procedure.                            After obtaining informed consent, the endoscope was                            passed under direct vision. Throughout the                            procedure, the patient's blood pressure, pulse, and                            oxygen saturations were monitored continuously. The                            GIF-H190 ([id number]) Olympus gastroscope was                            introduced through the mouth, and advanced to the                            second part of duodenum. The upper GI endoscopy was                            accomplished without difficulty. The patient                            tolerated the procedure well. Scope In: Scope Out: Findings:      Grade II varices were found in the lower third of the esophagus. They       were small in size. Three bands were successfully placed with complete       eradication, resulting in deflation of varices. There was no bleeding at       the end of the procedure. At the GE junction, an area of erythema and       minimal amount of red blood was noted prior to variceal banding and       unrelated to scope advancement?red wale sign.      Mild portal hypertensive gastropathy was found in the cardia, in the       gastric fundus and in the gastric body.      The examined duodenum was normal.      The cardia and gastric fundus were  otherwise normal on retroflexion. I       did not identify any gastric varices. Impression:               - Grade II esophageal varices. Completely                            eradicated. Banded.                           - Portal hypertensive gastropathy.                           - Normal examined duodenum.                           - No specimens collected. Moderate Sedation:      Patient did not receive moderate sedation for this procedure, but       instead received monitored anesthesia care. Recommendation:           - Patient has a contact number available for                             emergencies. The signs and symptoms of potential                            delayed complications were discussed with the                            patient. Return to normal activities tomorrow.                            Written discharge instructions were provided to the                            patient.                           - Resume regular diet.                           - Continue present medications.                           - Repeat upper endoscopy in 6 months for endoscopic                            band ligation. Procedure Code(s):        --- Professional ---                           [phone], Esophagogastroduodenoscopy, flexible,                            transoral; with band ligation of esophageal/gastric                            varices Diagnosis Code(s):        ---  Professional ---                           I85.00, Esophageal varices without bleeding                           K76.6, Portal hypertension                           K31.89, Other diseases of stomach and duodenum CPT copyright 2019 [employer]. All rights reserved. The codes documented in this report are preliminary and upon coder review may  be revised to meet current compliance requirements. Ronnette Juniper, MD 07/25/2020 10:09:44 AM This report has been signed electronically. Number of Addenda: 0

## 2020-07-25 NOTE — Brief Op Note (Signed)
07/25/2020  10:10 AM  PATIENT:  Mali R Burnstein  49 y.o. male  PRE-OPERATIVE DIAGNOSIS:  alcoholic cirrhosis of liver without ascites, screening colonoscopy  POST-OPERATIVE DIAGNOSIS:  esophageal varices banding grade 2  PROCEDURE:  Procedure(s): ESOPHAGOGASTRODUODENOSCOPY (EGD) WITH PROPOFOL WITH BANDING (N/A)  SURGEON:  Surgeon(s) and Role:    Ronnette Juniper, MD - Primary  PHYSICIAN ASSISTANT:   ASSISTANTS: Lilli Few, Lew Dawes, Tech  ANESTHESIA:   MAC  EBL:  Minimal  BLOOD ADMINISTERED:none  DRAINS: none   LOCAL MEDICATIONS USED:  NONE  SPECIMEN:  none  DISPOSITION OF SPECIMEN:  N/A  COUNTS:  YES  TOURNIQUET:  * No tourniquets in log *  DICTATION: .Dragon Dictation  PLAN OF CARE: Discharge to home after PACU  PATIENT DISPOSITION:  PACU - hemodynamically stable.   Delay start of Pharmacological VTE agent (>24hrs) due to surgical blood loss or risk of bleeding: not applicable

## 2020-07-25 NOTE — Anesthesia Procedure Notes (Signed)
Procedure Name: MAC Date/Time: 07/25/2020 10:00 AM Performed by: Lieutenant Diego, CRNA Pre-anesthesia Checklist: Patient identified, Emergency Drugs available, Suction available, Patient being monitored and Timeout performed Patient Re-evaluated:Patient Re-evaluated prior to induction Oxygen Delivery Method: Simple face mask Preoxygenation: Pre-oxygenation with 100% oxygen Induction Type: IV induction

## 2020-07-25 NOTE — Discharge Instructions (Signed)

## 2020-07-25 NOTE — Interval H&P Note (Signed)
History and Physical Interval Note: 49/male with life threatening esophageal variceal bleed, s/p TIPS, history of cirrhosis for an EGD for follow up and possible band ligation of esophageal varices. He was initially scheduled for a screening colonoscopy but did not drink the prep, colonoscopy for today has been cancelled.  07/25/2020 9:36 AM  Jimmy Gonzales  has presented today for EGD with possible variceal banding with the diagnosis of alcoholic cirrhosis of liver without ascites.  The various methods of treatment have been discussed with the patient and family. After consideration of risks, benefits and other options for treatment, the patient has consented to  Procedure(s): ESOPHAGOGASTRODUODENOSCOPY (EGD) WITH PROPOFOL WITH BANDING (N/A) COLONOSCOPY WITH PROPOFOL (N/A) as a surgical intervention.  The patient's history has been reviewed, patient examined, no change in status, stable for surgery.  I have reviewed the patient's chart and labs.  Questions were answered to the patient's satisfaction.     Ronnette Juniper

## 2020-07-25 NOTE — Anesthesia Postprocedure Evaluation (Signed)
Anesthesia Post Note  Patient: Jimmy Gonzales  Procedure(s) Performed: ESOPHAGOGASTRODUODENOSCOPY (EGD) WITH PROPOFOL WITH BANDING (N/A )     Patient location during evaluation: Endoscopy Anesthesia Type: MAC Level of consciousness: awake and alert Pain management: pain level controlled Vital Signs Assessment: post-procedure vital signs reviewed and stable Respiratory status: spontaneous breathing, nonlabored ventilation, respiratory function stable and patient connected to nasal cannula oxygen Cardiovascular status: stable and blood pressure returned to baseline Postop Assessment: no apparent nausea or vomiting Anesthetic complications: no   No complications documented.  Last Vitals:  Vitals:   07/25/20 1020 07/25/20 1030  BP: 137/87 (!) 144/86  Pulse: 97 87  Resp: 14 14  Temp:    SpO2: 94% 95%    Last Pain:  Vitals:   07/25/20 1030  TempSrc:   PainSc: 0-No pain                 Belenda Cruise P Urie Loughner

## 2020-07-25 NOTE — Anesthesia Preprocedure Evaluation (Signed)
Anesthesia Evaluation  Patient identified by MRN, date of birth, ID band Patient awake    Reviewed: Patient's Chart, lab work & pertinent test results  Airway Mallampati: II  TM Distance: >3 FB Neck ROM: Full    Dental  (+) Teeth Intact   Pulmonary neg pulmonary ROS,    Pulmonary exam normal        Cardiovascular hypertension, Pt. on medications  Rhythm:Regular Rate:Normal     Neuro/Psych Recent encephalopathy 2/2 ETOH on lactulose negative psych ROS   GI/Hepatic GERD  Medicated,(+) Cirrhosis   Esophageal Varices    ,   Endo/Other    Renal/GU      Musculoskeletal negative musculoskeletal ROS (+)   Abdominal (+)  Abdomen: soft. Bowel sounds: normal.  Peds  Hematology  (+) anemia ,   Anesthesia Other Findings   Reproductive/Obstetrics                             Anesthesia Physical Anesthesia Plan  ASA: III  Anesthesia Plan: MAC   Post-op Pain Management:    Induction:   PONV Risk Score and Plan: 1 and Propofol infusion  Airway Management Planned: Simple Face Mask and Nasal Cannula  Additional Equipment: None  Intra-op Plan:   Post-operative Plan:   Informed Consent: I have reviewed the patients History and Physical, chart, labs and discussed the procedure including the risks, benefits and alternatives for the proposed anesthesia with the patient or authorized representative who has indicated his/her understanding and acceptance.     Dental advisory given  Plan Discussed with: CRNA  Anesthesia Plan Comments:         Anesthesia Quick Evaluation

## 2020-07-27 ENCOUNTER — Encounter (HOSPITAL_COMMUNITY): Payer: Self-pay | Admitting: Gastroenterology

## 2020-07-31 DIAGNOSIS — E876 Hypokalemia: Secondary | ICD-10-CM | POA: Diagnosis not present

## 2020-11-03 ENCOUNTER — Other Ambulatory Visit: Payer: Self-pay | Admitting: Gastroenterology

## 2020-11-03 DIAGNOSIS — K7290 Hepatic failure, unspecified without coma: Secondary | ICD-10-CM | POA: Diagnosis not present

## 2020-11-03 DIAGNOSIS — D509 Iron deficiency anemia, unspecified: Secondary | ICD-10-CM | POA: Diagnosis not present

## 2020-11-03 DIAGNOSIS — K7030 Alcoholic cirrhosis of liver without ascites: Secondary | ICD-10-CM | POA: Diagnosis not present

## 2020-11-03 DIAGNOSIS — I8500 Esophageal varices without bleeding: Secondary | ICD-10-CM | POA: Diagnosis not present

## 2020-11-03 DIAGNOSIS — F1011 Alcohol abuse, in remission: Secondary | ICD-10-CM | POA: Diagnosis not present

## 2020-11-04 ENCOUNTER — Other Ambulatory Visit: Payer: Self-pay | Admitting: Gastroenterology

## 2020-12-24 ENCOUNTER — Other Ambulatory Visit: Payer: Self-pay | Admitting: Interventional Radiology

## 2020-12-24 DIAGNOSIS — Z95828 Presence of other vascular implants and grafts: Secondary | ICD-10-CM

## 2021-01-06 ENCOUNTER — Ambulatory Visit
Admission: RE | Admit: 2021-01-06 | Discharge: 2021-01-06 | Disposition: A | Discharge status: Home / Self Care | Payer: BC Managed Care – PPO | Source: Ambulatory Visit | Attending: Interventional Radiology | Admitting: Interventional Radiology

## 2021-01-06 ENCOUNTER — Encounter: Payer: Self-pay | Admitting: *Deleted

## 2021-01-06 DIAGNOSIS — K7030 Alcoholic cirrhosis of liver without ascites: Secondary | ICD-10-CM | POA: Diagnosis not present

## 2021-01-06 DIAGNOSIS — Z95828 Presence of other vascular implants and grafts: Secondary | ICD-10-CM

## 2021-01-06 DIAGNOSIS — Z9689 Presence of other specified functional implants: Secondary | ICD-10-CM | POA: Diagnosis not present

## 2021-01-06 DIAGNOSIS — Z0389 Encounter for observation for other suspected diseases and conditions ruled out: Secondary | ICD-10-CM | POA: Diagnosis not present

## 2021-01-06 HISTORY — PX: IR RADIOLOGIST EVAL & MGMT: IMG5224

## 2021-01-06 NOTE — Progress Notes (Signed)
Chief Complaint: Patient was seen in consultation today for follow up post TIPS.  History of Present Illness: Jimmy Gonzales is a 50 y.o. male with a history of alcoholic cirrhosis who had an emergent TIPS procedure on 05/15/2020 due to persistent bleeding from esophageal varices despite variceal banding.  Duplex ultrasound on 07/08/2020 demonstrated a widely patent TIPS shunt.  He underwent EGD on 07/25/2020 that demonstrated grade 2 varices of the lower third of the esophagus that were eradicated with placement of 3 bands.  He is scheduled for repeat EGD as well as colonoscopy at that time next month with Dr. Therisa Doyne.  He is on lactulose and Xifaxan and denies any confusion or trouble with activities of daily living.  He denies any vomiting of blood, blood in his stool, melena or abdominal pain.  He has been abstinent from alcohol use since before his hospital admission last August.  Past Medical History:  Diagnosis Date  . Cirrhosis, alcoholic ([place])   . HTN (hypertension)   . Hypothyroid     Past Surgical History:  Procedure Laterality Date  . ESOPHAGEAL BANDING  07/25/2020   Procedure: ESOPHAGEAL BANDING;  Surgeon: Ronnette Juniper, MD;  Location: [facility];  Service: Gastroenterology;;  . ESOPHAGOGASTRODUODENOSCOPY (EGD) WITH PROPOFOL N/A 07/25/2020   Procedure: ESOPHAGOGASTRODUODENOSCOPY (EGD) WITH PROPOFOL;  Surgeon: Ronnette Juniper, MD;  Location: [facility];  Service: Gastroenterology;  Laterality: N/A;  . IR ANGIOGRAM SELECTIVE EACH ADDITIONAL VESSEL  05/16/2020  . IR ANGIOGRAM SELECTIVE EACH ADDITIONAL VESSEL  05/16/2020  . IR ANGIOGRAM SELECTIVE EACH ADDITIONAL VESSEL  05/16/2020  . IR EMBO ART  VEN HEMORR LYMPH EXTRAV  INC GUIDE ROADMAPPING  05/16/2020  . IR RADIOLOGIST EVAL & MGMT  07/08/2020  . IR TIPS  05/16/2020  . RADIOLOGY WITH ANESTHESIA N/A 05/15/2020   Procedure: TIPS PROCEDURE;  Surgeon: Radiologist, Medication, MD;  Location: [place];  Service: Radiology;  Laterality:  N/A;    Allergies: Patient has no known allergies.  Medications: Prior to Admission medications   Medication Sig Start Date End Date Taking? Authorizing Provider  acetaminophen (TYLENOL) 325 MG tablet Take 1-2 tablets (325-650 mg total) by mouth every 4 (four) hours as needed for mild pain. Patient not taking: Reported on 07/17/2020 05/29/20   Love, Ivan Anchors, PA-C  amLODipine (NORVASC) 5 MG tablet Take 1 tablet (5 mg total) by mouth daily. 05/29/20   Love, Ivan Anchors, PA-C  camphor-[facility]) lotion Apply topically 4 (four) times daily -  with meals and at bedtime. Patient taking differently: Apply 1 application topically daily as needed for itching.  05/29/20   Love, Ivan Anchors, PA-C  FEROSUL 325 (65 Fe) MG tablet Take 325 mg by mouth 2 (two) times daily. 07/11/20   [provider]  hydrOXYzine (ATARAX/VISTARIL) 25 MG tablet Take 25 mg by mouth every 8 (eight) hours as needed for itching. 07/03/20   [provider]  lactulose (CHRONULAC) 10 GM/15ML solution Take 30 mLs (20 g total) by mouth 2 (two) times daily. 05/29/20   Love, Ivan Anchors, PA-C  levothyroxine (SYNTHROID) 25 MCG tablet Take 1 tablet (25 mcg total) by mouth daily at 6 (six) AM. Patient taking differently: Take 25 mcg by mouth daily before breakfast.  05/29/20   Love, Ivan Anchors, PA-C  Multiple Vitamins-Minerals (MULTIVITAMIN WITH MINERALS) tablet Take 1 tablet by mouth daily.    [provider]  Naphazoline-Pheniramine (OPCON-A) 0.027-0.315 % SOLN Place 1 drop into both eyes daily as needed (dry eyes/irritation).  [provider]  pantoprazole (PROTONIX) 40 MG tablet Take 1 tablet (40 mg total) by mouth 2 (two) times daily. Patient not taking: Reported on 07/17/2020 05/29/20   Love, Ivan Anchors, PA-C  potassium chloride SA (KLOR-CON) 20 MEQ tablet Take 1 tablet (20 mEq total) by mouth daily. 05/29/20   Love, Ivan Anchors, PA-C  rifaximin (XIFAXAN) 550 MG TABS tablet Take 1 tablet (550 mg total) by mouth 2 (two)  times daily. 05/29/20   Love, Ivan Anchors, PA-C  thiamine 100 MG tablet Take 1 tablet (100 mg total) by mouth daily. 05/29/20   Love, Ivan Anchors, PA-C  traZODone (DESYREL) 50 MG tablet Take 0.5-1 tablets (25-50 mg total) by mouth at bedtime as needed for sleep. Patient not taking: Reported on 07/17/2020 05/29/20   Bary Leriche, PA-C     Family History  Problem Relation Age of Onset  . Lung cancer Mother   . Congestive Heart Failure Father     Social History   Socioeconomic History  . Marital status: Married    Spouse name: Jimmy Gonzales   . Number of children: Not on file  . Years of education: Not on file  . Highest education level: Not on file  Occupational History  . Not on file  Tobacco Use  . Smoking status: Unknown If Ever Smoked  . Smokeless tobacco: Current User    Types: Chew  Substance and Sexual Activity  . Alcohol use: Yes    Comment: "drinks 1 gallon of booze a day"  . Drug use: Not on file  . Sexual activity: Not on file  Other Topics Concern  . Not on file  Social History Narrative  . Not on file   Social Determinants of Health   Financial Resource Strain: Not on file  Food Insecurity: Not on file  Transportation Needs: Not on file  Physical Activity: Not on file  Stress: Not on file  Social Connections: Not on file    Review of Systems: A 12 point ROS discussed and pertinent positives are indicated in the HPI above.  All other systems are negative.  Review of Systems  Constitutional: Negative.   Respiratory: Negative.   Cardiovascular: Positive for leg swelling. Negative for chest pain and palpitations.       Occasional ankle/lower leg edema  Gastrointestinal: Negative.   Genitourinary: Negative.   Musculoskeletal: Negative.   Neurological: Negative.     Vital Signs: BP 139/80 (BP Location: Right Arm)   Pulse 79   SpO2 96%   Physical Exam Vitals reviewed.  Constitutional:      General: He is not in acute distress.    Appearance: Normal  appearance. He is not ill-appearing, toxic-appearing or diaphoretic.  Abdominal:     General: There is no distension.     Palpations: Abdomen is soft.     Tenderness: There is no abdominal tenderness. There is no guarding or rebound.  Musculoskeletal:        General: No swelling.  Skin:    General: Skin is warm and dry.     Coloration: Skin is not jaundiced.  Neurological:     General: No focal deficit present.     Mental Status: He is alert and oriented to person, place, and time.      Imaging: US ABDOMINAL PELVIC ART/VENT FLOW DOPPLER  Result Date: 01/06/2021 CLINICAL DATA:  Status post TIPS placement on 05/16/2020 to treat acute refractory esophageal variceal bleeding secondary to alcoholic cirrhosis. EXAM: DUPLEX ULTRASOUND OF LIVER  AND TIPS SHUNT TECHNIQUE: Color and duplex Doppler ultrasound was performed to evaluate the hepatic in-flow and out-flow vessels. COMPARISON:  Prior tips duplex ultrasound on 07/08/2020 FINDINGS: Portal Vein Velocities Main:  31 cm/sec Right:  50 cm/sec Left:  21 cm/sec TIPS Stent Velocities Proximal:  149 cm/sec Mid:  135 Distal:  165 cm/sec IVC: Present and patent with normal respiratory phasicity. Hepatic Vein Velocities Right:  41 cm/sec Mid:  60 cm/sec Left:  41 cm/sec Splenic Vein: 42 Superior Mesenteric Vein: 57 Hepatic Artery: 84 cm per second Ascites: None visualized. Varices: None visualized. Other findings: No evidence of portal vein thrombus with direction of portal vein flow towards the liver. The TIPS shunt demonstrates normal patency with consistent velocities and normal waveforms. No evidence to suggest intrastent stenosis. The liver demonstrates stable cirrhosis. Previously visualized probable hemangioma in the right lobe was not imaged on today's study. IMPRESSION: Normally patent TIPS shunt with normal velocities and waveforms. Electronically Signed   By: Aletta Edouard M.D.   On: 01/06/2021 09:34    Labs:  CBC: Recent Labs     05/26/20 0209 05/27/20 0104 05/27/20 0615 05/28/20 0732  WBC 21.9* 21.4* 25.5* 23.0*  HGB 9.4* 9.1* 10.4* 9.9*  HCT 29.3* 28.7* 31.9* 31.8*  PLT 257 237 349 282    COAGS: Recent Labs    05/16/20 0019 05/16/20 1430 05/17/20 0453 05/17/20 1819 05/18/20 0500 05/18/20 1820  INR 2.5*  2.5*   < > 1.8* 1.7* 1.6* 1.5*  APTT 39*  --   --   --   --   --    < > = values in this interval not displayed.    BMP: Recent Labs    05/26/20 0209 05/26/20 2019 05/27/20 0104 05/28/20 0732  NA 140 138 138 137  K 3.5 3.8 3.8 4.0  CL 102 99 101 99  CO2 '25 29 26 27  ' GLUCOSE 99 138* 125* 164*  BUN '8 6 7 9  ' CALCIUM 8.7* 9.0 8.8* 9.2  CREATININE 0.70 0.70 0.62 0.78  GFRNONAA >60 >60 >60 >60  GFRAA >60 >60 >60 >60    LIVER FUNCTION TESTS: Recent Labs    05/22/20 0444 05/24/20 0102 05/26/20 0209 05/27/20 0104  BILITOT 6.3* 4.5* 4.0* 4.5*  AST 138* 126* 125* 126*  ALT 69* 73* 79* 89*  ALKPHOS 173* 187* 192* 208*  PROT 6.7 6.6 6.4* 6.6  ALBUMIN 2.7* 2.7* 2.5* 2.6*     Assessment and Plan:  I met with Mr. Maffeo and his wife.  We reviewed duplex ultrasound findings from today which demonstrate excellent velocities throughout the TIPS shunt with no evidence of occlusion or stenosis by duplex ultrasound.  There is no evidence of ascites by ultrasound or portal vein thrombus.  I recommended a follow-up duplex ultrasound in 1 year.  He will follow-up with Dr. Therisa Doyne next month for his scheduled EGD and colonoscopy.   Electronically Signed: Azzie Roup 01/06/2021, 9:37 AM     I spent a total of 10 Minutes in face to face in clinical consultation, greater than 50% of which was counseling/coordinating care post TIPS placement.

## 2021-01-27 DIAGNOSIS — E785 Hyperlipidemia, unspecified: Secondary | ICD-10-CM | POA: Diagnosis not present

## 2021-01-27 DIAGNOSIS — Z79899 Other long term (current) drug therapy: Secondary | ICD-10-CM | POA: Diagnosis not present

## 2021-01-27 DIAGNOSIS — E039 Hypothyroidism, unspecified: Secondary | ICD-10-CM | POA: Diagnosis not present

## 2021-01-27 DIAGNOSIS — I10 Essential (primary) hypertension: Secondary | ICD-10-CM | POA: Diagnosis not present

## 2021-01-27 DIAGNOSIS — K219 Gastro-esophageal reflux disease without esophagitis: Secondary | ICD-10-CM | POA: Diagnosis not present

## 2021-02-05 ENCOUNTER — Other Ambulatory Visit: Payer: Self-pay

## 2021-02-05 ENCOUNTER — Encounter (HOSPITAL_COMMUNITY): Payer: Self-pay | Admitting: Gastroenterology

## 2021-02-06 ENCOUNTER — Other Ambulatory Visit (HOSPITAL_COMMUNITY)
Admission: RE | Admit: 2021-02-06 | Discharge: 2021-02-06 | Disposition: A | Discharge status: Home / Self Care | Payer: BC Managed Care – PPO | Source: Ambulatory Visit | Attending: Gastroenterology | Admitting: Gastroenterology

## 2021-02-06 DIAGNOSIS — Z01812 Encounter for preprocedural laboratory examination: Secondary | ICD-10-CM | POA: Insufficient documentation

## 2021-02-06 DIAGNOSIS — Z20822 Contact with and (suspected) exposure to covid-19: Secondary | ICD-10-CM | POA: Insufficient documentation

## 2021-02-06 LAB — SARS CORONAVIRUS 2 (TAT 6-24 HRS): SARS Coronavirus 2: NEGATIVE

## 2021-02-09 NOTE — H&P (Signed)
History of Present Illness  General:          50 year old male, had EGD 07/25/20 by Dr. Therisa Doyne with banding of esophageal varices at [facility]. There were small grade 2 varices found in the lower third of esophagus. 3 bands were successfully placed with no bleeding. Mild portal gastropathy. Repeat EGD with variceal banding was recommended in 6 months, which will be due 12/2020. He has never had a colonoscopy and he will be due for his first screening colonoscopy 12/2020 at the same time as EGD.        He was transferred from [facility] 04/2020 with massive hematemesis and hemorrhagic shock requiring blood transfusions, IV pressors and intubation, emergency TIPS by IR at [facility]. He was in the ICU and intubated for a few days. Ultimately discharged on lactulose, Xifaxan, prednisolone for 28 days. He was admitted to rehabilitation for inpatient there treatment of alcoholism. Discharged on lactulose, Protonix, and Xifaxan.        Last drink of alcohol was 05/12/20. He is here today with his wife. His condition is stable and improved. He has noticed mild bilateral lower leg and ankle swelling. His weight is up 5 pounds in the past 4 months. He denies any abdominal swelling, nausea, vomiting, hematemesis, or melena. Generalized itching has improved. He is not taking Xifaxan.        Labs from 05/28/20 showed normal electrolytes, creatinine 0.78, total belief 4.5, AST 126, ALT 18 9, ALP 208, WBC 23, hemoglobin 9.9, platelet 282. PT/INR was 17.9/1.5 on 05/18/20.        Last abdominal Ultrasound done 06/2020 showed stable bening 1.6cm liver hemangioma and no ascites. Six-month repeat liver ultrasound will be due 12/2020 to screen for hepatoma.   Current Medications  Taking  .Multivitamin . Tablet 1 tablet by mouth Once daily    .B-1 100 MG Tablet 1 tablet Orally Once a day    .FeroSul(Iron) 325 (65 Fe) MG Tablet 1 tablet Orally twice a day    .Synthroid(L-Thyroxine Sodium) 25 MCG Tablet 1 tablet in  the morning on an empty stomach Orally Once a day    .amLODIPine Besylate 5 MG Tablet 1 tablet Orally Once a day    .Potassium Chloride 20 MEQ/15ML (10%) Liquid 15 ml with food Orally Once a day    .Lactulose 20 GM/30ML Solution 85ml Orally Twice a day    .Xifaxan 550 MG tablet 1 tablet Orally Twice a day   Not-Taking  .hydrOXYzine HCl 25 MG Tablet 1 tablet as needed Orally every 8 hrs    .Protonix(Pantoprazole Sodium) 40 MG Tablet Delayed Release 1 tablet Orally Twice a day    Medication List reviewed and reconciled with the patient         Past Medical History        40/9811 Alcoholic Cirrhosis .         HTN.         Hepatic Encephalopathy.         06/2020 Esophageal Variceal Bleed.        Surgical History         TIPS 04/2020       Family History   Father: deceased   Mother: deceased   Son(s): alive 41 yrs   1 son(s) - healthy.    Negative Family history on colon cancer, poylps and liver Disease.       Social History  General:   Tobacco use      cigarettes: Never  smoked    Tobacco history last updated 11/03/2020    Vaping No no Alcohol.  Caffeine: yes, coffee, 1 serving daily.  no Recreational drug use.  DIET: regular, low salt.      Allergies   Xifaxan: rash - Allergy       Hospitalization/Major Diagnostic Procedure   Pt was in hospital for surgery 05/15/2020   not within the past yr 10/2020       Review of Systems  GI PROCEDURE:         Pacemaker/ AICD no. Artificial heart valves no. MI/heart attack no. Abnormal heart rhythm no. Angina no. CVA no. Hypertension YES. Hypotension no. Asthma, COPD no. Sleep apnea no. Seizure disorders no. Artificial joints YES both hips . Severe DJD no. Diabetes no. Significant headaches no. Vertigo YES. Depression/anxiety no. Abnormal bleeding no. Kidney Disease no. Liver disease YES. Chance of pregnancy no. Blood transfusion YES.           Vital Signs  Wt 233.4, Wt change 5.2 lb, Ht 73,  BMI 30.79, Temp 98.5, Pulse sitting 90, BP sitting 146/73.     Examination  Gastroenterology Exam:       GENERAL APPEARANCE: Well developed, well nourished, no active distress, pleasant, no acute distress.        SCLERA: anicteric.        RESPIRATORY Breath sounds clear to auscultation. No wheezes, rales or rhonchi. Respiration even and unlabored.        CARDIOVASCULAR Normal RRR w/o murmers or gallops. No peripheral edema.        ABDOMEN No masses palpated. Liver and spleen not palpated, normal. Bowel sounds normal, Abdomen not distended; Reducible Umbilical Hernia present which is not tender; No Ascites; No abdominal tenderness.Marland Kitchen        PSYCHIATRIC Alert and oriented x3, mood and affect appear normal..       Assessments     1. Alcoholic cirrhosis of liver without ascites - K70.30 (Primary), Stable and Improved.   2. History of alcohol abuse - F10.11, Abstinent since 05/12/20.   3. Hepatic encephalopathy - K72.90, Stable and Improved.   4. Esophageal varices - I85.00, S/P Banding Procedure by DR. Therisa Doyne 06/2020; Repeat in 6 months (Due 12/2020).   5. Colon cancer screening - Z12.11, Due for first Screening Colonoscopy.   6. Iron deficiency anemia - D50.9   7. Extremity edema - R60.0, Mild; bilateral lower extremities.   8. Hypokalemia - E87.6     Treatment   1. Alcoholic cirrhosis of liver without ascites        LAB: PT (Prothrombin Time) ([id number])      LAB: CBC with Diff      LAB: Comp Metabolic Panel Notes: 6 month repeat abdominal ultrasound will be due 12/2020 to screen for hepatoma.      2. History of alcohol abuse   Notes: He has been completely abstinent from all alcohol for 6 months since 05/12/2020.      3. Hepatic encephalopathy   Continue Lactulose Solution, 20 GM/30ML, 3ml, Orally, Twice a day, 30 days, 1800 ml, Refills 5 Start Xifaxan Tablet, 550 MG, 1 tablet, Orally, Twice a day, 30 day(s), 60, Refills 5      LAB: Ammonia, Plasma ([id number]) Notes: Controlled  on lactulose and Xifaxan. I am refilling medications.      4. Esophageal varices        IMAGING: EGD with Band Ligation of Varices  Thompson,Sharona 11/04/2020 02:53:39 PM > Pt is sched a colon/egd(banding) on 4/13 with Dr. Therisa Doyne. pt was given trilyte rx, prep instructions, [id number]   Notes: Schedule six-month repeat EGD with esophageal variceal banding with Dr. Therisa Doyne at [facility] 12/2020.           5. Colon cancer screening        IMAGING: Colonoscopy              Thompson,Sharona 11/04/2020 02:47:46 PM > Pt is sched a colon/egd(banding) on 4/13 with Dr. Therisa Doyne. pt was given trilyte rx, prep instructions, [id number]   Notes: Schedule for screening colonoscopy with Dr. Therisa Doyne at [facility] 12/2020 at same time as EGD.      6. Iron deficiency anemia   Continue FeroSul Tablet, 325 (65 Fe) MG, 1 tablet, Orally, twice a day      LAB: Iron Panel   7. Extremity edema   Start Furosemide Tablet, 20 MG, 1 tablet, Orally, Once a day, 30 day(s), 30, Refills 5   8. Hypokalemia   Continue Potassium Chloride Liquid, 20 MEQ/15ML (10%), 15 ml with food, Orally, Once a day

## 2021-02-09 NOTE — Anesthesia Preprocedure Evaluation (Addendum)
Anesthesia Evaluation  Patient identified by MRN, date of birth, ID band Patient awake    Reviewed: Allergy & Precautions, NPO status , Patient's Chart, lab work & pertinent test results  Airway Mallampati: II  TM Distance: >3 FB Neck ROM: Full    Dental no notable dental hx. (+) Teeth Intact, Dental Advisory Given   Pulmonary neg pulmonary ROS,    Pulmonary exam normal breath sounds clear to auscultation       Cardiovascular hypertension, Pt. on medications Normal cardiovascular exam Rhythm:Regular Rate:Normal     Neuro/Psych negative neurological ROS  negative psych ROS   GI/Hepatic GERD  ,(+) Cirrhosis   Esophageal Varices    ,   Endo/Other  Hypothyroidism   Renal/GU Renal disease     Musculoskeletal negative musculoskeletal ROS (+)   Abdominal (+) + obese,   Peds  Hematology  (+) anemia , Lab Results      Component                Value               Date                      WBC                      23.0 (H)            05/28/2020                HGB                      9.9 (L)             05/28/2020                HCT                      31.8 (L)            05/28/2020                MCV                      95.5                05/28/2020                PLT                      282                 05/28/2020              Anesthesia Other Findings   Reproductive/Obstetrics                            Anesthesia Physical Anesthesia Plan  ASA: III  Anesthesia Plan: MAC   Post-op Pain Management:    Induction:   PONV Risk Score and Plan: Treatment may vary due to age or medical condition  Airway Management Planned: Natural Airway and Nasal Cannula  Additional Equipment: None  Intra-op Plan:   Post-operative Plan:   Informed Consent: I have reviewed the patients History and Physical, chart, labs and discussed the procedure including the risks, benefits and alternatives  for the proposed anesthesia with the patient or authorized representative who has indicated  his/her understanding and acceptance.     Dental advisory given  Plan Discussed with: CRNA  Anesthesia Plan Comments: (Esophageal varices banding and colon ca screenin for EGD colon)       Anesthesia Quick Evaluation

## 2021-02-10 ENCOUNTER — Other Ambulatory Visit: Payer: Self-pay

## 2021-02-10 ENCOUNTER — Encounter (HOSPITAL_COMMUNITY): Payer: Self-pay | Admitting: Gastroenterology

## 2021-02-10 ENCOUNTER — Ambulatory Visit (HOSPITAL_COMMUNITY): Payer: BC Managed Care – PPO | Admitting: Certified Registered"

## 2021-02-10 ENCOUNTER — Ambulatory Visit (HOSPITAL_COMMUNITY)
Admission: RE | Admit: 2021-02-10 | Discharge: 2021-02-10 | Disposition: A | Discharge status: Home / Self Care | Payer: BC Managed Care – PPO | Attending: Gastroenterology | Admitting: Gastroenterology

## 2021-02-10 ENCOUNTER — Encounter (HOSPITAL_COMMUNITY)
Admission: RE | Disposition: A | Discharge status: Home / Self Care | Payer: Self-pay | Source: Home / Self Care | Attending: Gastroenterology

## 2021-02-10 DIAGNOSIS — I8500 Esophageal varices without bleeding: Secondary | ICD-10-CM | POA: Diagnosis not present

## 2021-02-10 DIAGNOSIS — E039 Hypothyroidism, unspecified: Secondary | ICD-10-CM | POA: Diagnosis not present

## 2021-02-10 DIAGNOSIS — D124 Benign neoplasm of descending colon: Secondary | ICD-10-CM | POA: Insufficient documentation

## 2021-02-10 DIAGNOSIS — K269 Duodenal ulcer, unspecified as acute or chronic, without hemorrhage or perforation: Secondary | ICD-10-CM | POA: Diagnosis not present

## 2021-02-10 DIAGNOSIS — K766 Portal hypertension: Secondary | ICD-10-CM | POA: Diagnosis not present

## 2021-02-10 DIAGNOSIS — Z79899 Other long term (current) drug therapy: Secondary | ICD-10-CM | POA: Insufficient documentation

## 2021-02-10 DIAGNOSIS — K7030 Alcoholic cirrhosis of liver without ascites: Secondary | ICD-10-CM | POA: Insufficient documentation

## 2021-02-10 DIAGNOSIS — Z1211 Encounter for screening for malignant neoplasm of colon: Secondary | ICD-10-CM | POA: Insufficient documentation

## 2021-02-10 DIAGNOSIS — K7290 Hepatic failure, unspecified without coma: Secondary | ICD-10-CM | POA: Insufficient documentation

## 2021-02-10 DIAGNOSIS — Z79890 Hormone replacement therapy (postmenopausal): Secondary | ICD-10-CM | POA: Diagnosis not present

## 2021-02-10 DIAGNOSIS — Z888 Allergy status to other drugs, medicaments and biological substances status: Secondary | ICD-10-CM | POA: Insufficient documentation

## 2021-02-10 DIAGNOSIS — I8510 Secondary esophageal varices without bleeding: Secondary | ICD-10-CM | POA: Diagnosis not present

## 2021-02-10 DIAGNOSIS — K649 Unspecified hemorrhoids: Secondary | ICD-10-CM | POA: Diagnosis not present

## 2021-02-10 DIAGNOSIS — K264 Chronic or unspecified duodenal ulcer with hemorrhage: Secondary | ICD-10-CM | POA: Diagnosis not present

## 2021-02-10 DIAGNOSIS — K260 Acute duodenal ulcer with hemorrhage: Secondary | ICD-10-CM | POA: Diagnosis not present

## 2021-02-10 DIAGNOSIS — K3189 Other diseases of stomach and duodenum: Secondary | ICD-10-CM | POA: Insufficient documentation

## 2021-02-10 DIAGNOSIS — K219 Gastro-esophageal reflux disease without esophagitis: Secondary | ICD-10-CM | POA: Insufficient documentation

## 2021-02-10 DIAGNOSIS — E876 Hypokalemia: Secondary | ICD-10-CM | POA: Insufficient documentation

## 2021-02-10 DIAGNOSIS — I10 Essential (primary) hypertension: Secondary | ICD-10-CM | POA: Insufficient documentation

## 2021-02-10 DIAGNOSIS — I8511 Secondary esophageal varices with bleeding: Secondary | ICD-10-CM | POA: Diagnosis not present

## 2021-02-10 DIAGNOSIS — D509 Iron deficiency anemia, unspecified: Secondary | ICD-10-CM | POA: Diagnosis not present

## 2021-02-10 DIAGNOSIS — K7460 Unspecified cirrhosis of liver: Secondary | ICD-10-CM | POA: Diagnosis not present

## 2021-02-10 HISTORY — PX: SUBMUCOSAL TATTOO INJECTION: SHX6856

## 2021-02-10 HISTORY — PX: COLONOSCOPY WITH PROPOFOL: SHX5780

## 2021-02-10 HISTORY — PX: ESOPHAGOGASTRODUODENOSCOPY (EGD) WITH PROPOFOL: SHX5813

## 2021-02-10 HISTORY — PX: POLYPECTOMY: SHX5525

## 2021-02-10 HISTORY — PX: BIOPSY: SHX5522

## 2021-02-10 SURGERY — COLONOSCOPY WITH PROPOFOL
Anesthesia: Monitor Anesthesia Care

## 2021-02-10 MED ORDER — PROPOFOL 1000 MG/100ML IV EMUL
INTRAVENOUS | Status: AC
  Filled 2021-02-10: qty 100

## 2021-02-10 MED ORDER — LACTATED RINGERS IV SOLN
INTRAVENOUS | Status: DC
  Administered 2021-02-10: 1000 mL via INTRAVENOUS

## 2021-02-10 MED ORDER — PROPOFOL 10 MG/ML IV BOLUS
INTRAVENOUS | Status: AC
  Filled 2021-02-10: qty 20

## 2021-02-10 MED ORDER — LACTATED RINGERS IV SOLN: INTRAVENOUS | Status: DC | PRN

## 2021-02-10 MED ORDER — LIDOCAINE 2% (20 MG/ML) 5 ML SYRINGE
INTRAMUSCULAR | Status: DC | PRN
  Administered 2021-02-10: 60 mg via INTRAVENOUS

## 2021-02-10 MED ORDER — SODIUM CHLORIDE 0.9 % IV SOLN: INTRAVENOUS | Status: DC

## 2021-02-10 MED ORDER — PROPOFOL 500 MG/50ML IV EMUL
INTRAVENOUS | Status: DC | PRN
  Administered 2021-02-10: 200 ug/kg/min via INTRAVENOUS

## 2021-02-10 MED ORDER — PROPOFOL 500 MG/50ML IV EMUL
INTRAVENOUS | Status: AC
  Filled 2021-02-10: qty 50

## 2021-02-10 MED ORDER — SPOT INK MARKER SYRINGE KIT
PACK | SUBMUCOSAL | Status: DC | PRN
  Administered 2021-02-10: 6 mL via SUBMUCOSAL

## 2021-02-10 MED ORDER — DEXMEDETOMIDINE (PRECEDEX) IN NS 20 MCG/5ML (4 MCG/ML) IV SYRINGE
PREFILLED_SYRINGE | INTRAVENOUS | Status: DC | PRN
  Administered 2021-02-10: 12 ug via INTRAVENOUS

## 2021-02-10 MED ORDER — PROPOFOL 10 MG/ML IV BOLUS
INTRAVENOUS | Status: DC | PRN
  Administered 2021-02-10: 20 mg via INTRAVENOUS

## 2021-02-10 MED ORDER — PANTOPRAZOLE SODIUM 40 MG PO TBEC: 40.0000 mg | DELAYED_RELEASE_TABLET | Freq: Every day | ORAL | 1 refills | Status: AC

## 2021-02-10 MED ORDER — DEXMEDETOMIDINE (PRECEDEX) IN NS 20 MCG/5ML (4 MCG/ML) IV SYRINGE
PREFILLED_SYRINGE | INTRAVENOUS | Status: AC
  Filled 2021-02-10: qty 5

## 2021-02-10 SURGICAL SUPPLY — 25 items

## 2021-02-10 NOTE — Discharge Instructions (Signed)

## 2021-02-10 NOTE — Anesthesia Procedure Notes (Signed)
Procedure Name: MAC Date/Time: 02/10/2021 7:59 AM Performed by: Eben Burow, CRNA Pre-anesthesia Checklist: Patient identified, Emergency Drugs available, Suction available, Patient being monitored and Timeout performed Oxygen Delivery Method: Simple face mask Placement Confirmation: positive ETCO2

## 2021-02-10 NOTE — Op Note (Signed)
[facility] Patient Name: Jimmy Gonzales Procedure Date: 02/10/2021 MRN: [id number] Attending MD: Ronnette Juniper , MD Date of Birth: July 25, 1971 CSN: [id number] Age: 50 Admit Type: Outpatient Procedure:                Colonoscopy Indications:              Screening for colorectal malignant neoplasm, This                            is the patient's first colonoscopy Providers:                Ronnette Juniper, MD, Nelia Shi, RN, Benetta Spar, Technician Referring MD:             Josie Dixon Medicines:                Monitored Anesthesia Care Complications:            No immediate complications. Estimated blood loss:                            Minimal. Estimated Blood Loss:     Estimated blood loss was minimal. Procedure:                Pre-Anesthesia Assessment:                           - Prior to the procedure, a History and Physical                            was performed, and patient medications and                            allergies were reviewed. The patient's tolerance of                            previous anesthesia was also reviewed. The risks                            and benefits of the procedure and the sedation                            options and risks were discussed with the patient.                            All questions were answered, and informed consent                            was obtained. Prior Anticoagulants: The patient has                            taken no previous anticoagulant or antiplatelet                            agents. ASA Grade Assessment:  III - A patient with                            severe systemic disease. After reviewing the risks                            and benefits, the patient was deemed in                            satisfactory condition to undergo the procedure.                           - Prior to the procedure, a History and Physical                            was  performed, and patient medications and                            allergies were reviewed. The patient's tolerance of                            previous anesthesia was also reviewed. The risks                            and benefits of the procedure and the sedation                            options and risks were discussed with the patient.                            All questions were answered, and informed consent                            was obtained. Prior Anticoagulants: The patient has                            taken no previous anticoagulant or antiplatelet                            agents. ASA Grade Assessment: III - A patient with                            severe systemic disease. After reviewing the risks                            and benefits, the patient was deemed in                            satisfactory condition to undergo the procedure.                           After obtaining informed consent, the colonoscope  was passed under direct vision. Throughout the                            procedure, the patient's blood pressure, pulse, and                            oxygen saturations were monitored continuously. The                            PCF-H190DL ([id number] Olympus pediatric colonscope                            was introduced through the anus and advanced to the                            the cecum, identified by appendiceal orifice and                            ileocecal valve. The colonoscopy was performed                            without difficulty. The patient tolerated the                            procedure well. The quality of the bowel                            preparation was fair. Scope In: 8:09:33 AM Scope Out: 8:39:49 AM Scope Withdrawal Time: 0 hours 25 minutes 0 seconds  Total Procedure Duration: 0 hours 30 minutes 16 seconds  Findings:      A frond-like/villous, fungating, polypoid and sessile non-obstructing        large mass was found in the descending colon, from 50-55 cm from       insertion. The mass was partially circumferential (involving one-half of       the lumen circumference). The mass measured 5 cm in length(extending       across 3 colonic folds). No bleeding was present. This was biopsied with       a hot snare for histology. Area was tattooed with an injection of 6 mL       of Spot (carbon black) at the proximal and distal ends.      A large amount of liquid semi-liquid stool was found in the entire       colon, making visualization difficult. Lavage of the area was performed,       resulting in clearance with fair visualization.      The exam was otherwise without abnormality on direct and retroflexion       views.      Hemorrhoids were found on perianal exam. Impression:               - Preparation of the colon was fair.                           - Likely malignant tumor in the descending colon.  Biopsied. Tattooed.                           - Stool in the entire examined colon.                           - The examination was otherwise normal on direct                            and retroflexion views.                           - Hemorrhoids found on perianal exam. Moderate Sedation:      Patient did not receive moderate sedation for this procedure, but       instead received monitored anesthesia care. Recommendation:           - Patient has a contact number available for                            emergencies. The signs and symptoms of potential                            delayed complications were discussed with the                            patient. Return to normal activities tomorrow.                            Written discharge instructions were provided to the                            patient.                           - Resume regular diet.                           - Continue present medications.                           - Await pathology  results.                           - Refer to a surgeon at appointment to be scheduled. Procedure Code(s):        --- Professional ---                           [phone], Colonoscopy, flexible; with removal of                            tumor(s), polyp(s), or other lesion(s) by snare                            technique                           [id number],  Colonoscopy, flexible; with directed                            submucosal injection(s), any substance Diagnosis Code(s):        --- Professional ---                           Z12.11, Encounter for screening for malignant                            neoplasm of colon                           D49.0, Neoplasm of unspecified behavior of                            digestive system CPT copyright 2019 American [employer] Association. All rights reserved. The codes documented in this report are preliminary and upon coder review may  be revised to meet current compliance requirements. Ronnette Juniper, MD 02/10/2021 8:51:34 AM This report has been signed electronically. Number of Addenda: 0

## 2021-02-10 NOTE — Anesthesia Postprocedure Evaluation (Signed)
Anesthesia Post Note  Patient: Mali R Pedrosa  Procedure(s) Performed: COLONOSCOPY WITH PROPOFOL (N/A ) ESOPHAGOGASTRODUODENOSCOPY (EGD) WITH PROPOFOL (N/A ) BIOPSY POLYPECTOMY     Patient location during evaluation: Endoscopy Anesthesia Type: MAC Level of consciousness: awake and alert Pain management: pain level controlled Vital Signs Assessment: post-procedure vital signs reviewed and stable Respiratory status: spontaneous breathing, nonlabored ventilation, respiratory function stable and patient connected to nasal cannula oxygen Cardiovascular status: blood pressure returned to baseline and stable Postop Assessment: no apparent nausea or vomiting Anesthetic complications: no   No complications documented.  Last Vitals:  Vitals:   02/10/21 0900 02/10/21 0910  BP: (!) 107/49 (!) 131/97  Pulse: 72 82  Resp: 11 13  Temp:    SpO2: 93% 99%    Last Pain:  Vitals:   02/10/21 0910  TempSrc:   PainSc: 0-No pain                 Barnet Glasgow

## 2021-02-10 NOTE — Op Note (Signed)
[facility] Patient Name: Jimmy Gonzales Procedure Date: 02/10/2021 MRN: [id number] Attending MD: Ronnette Juniper , MD Date of Birth: 01-18-1971 CSN: [id number] Age: 50 Admit Type: Outpatient Procedure:                Upper GI endoscopy Indications:              Follow-up of esophageal varices Providers:                Ronnette Juniper, MD, Nelia Shi, RN, Benetta Spar, Technician Referring MD:             Josie Dixon Medicines:                Monitored Anesthesia Care Complications:            No immediate complications. Estimated blood loss:                            Minimal. Estimated Blood Loss:     Estimated blood loss was minimal. Procedure:                Pre-Anesthesia Assessment:                           - Prior to the procedure, a History and Physical                            was performed, and patient medications and                            allergies were reviewed. The patient's tolerance of                            previous anesthesia was also reviewed. The risks                            and benefits of the procedure and the sedation                            options and risks were discussed with the patient.                            All questions were answered, and informed consent                            was obtained. Prior Anticoagulants: The patient has                            taken no previous anticoagulant or antiplatelet                            agents. ASA Grade Assessment: III - A patient with  severe systemic disease. After reviewing the risks                            and benefits, the patient was deemed in                            satisfactory condition to undergo the procedure.                           After obtaining informed consent, the endoscope was                            passed under direct vision. Throughout the                            procedure,  the patient's blood pressure, pulse, and                            oxygen saturations were monitored continuously. The                            GIF-H190 ([id number]) was introduced through the                            mouth, and advanced to the second part of duodenum.                            The upper GI endoscopy was accomplished without                            difficulty. The patient tolerated the procedure                            well. Scope In: Scope Out: Findings:      The upper third of the esophagus and middle third of the esophagus were       normal.      Grade I varices were found in the lower third of the esophagus. They       were small in size. There was scarring noted in areas of prior banding.      Mild portal hypertensive gastropathy was found in the gastric body.      The cardia and gastric fundus were normal on retroflexion.      Diffuse mildly erythematous mucosa without bleeding was found in the       gastric antrum. Biopsies were taken with a cold forceps for Helicobacter       pylori testing.      Few non-bleeding superficial duodenal ulcers with a clean ulcer base       (Forrest Class III) were found in the duodenal bulb. The largest lesion       was 6 mm in largest dimension.      The first portion of the duodenum and second portion of the duodenum       were normal. Impression:               - Normal upper third of esophagus and  middle third                            of esophagus.                           - Grade I esophageal varices.                           - Portal hypertensive gastropathy.                           - Erythematous mucosa in the antrum. Biopsied.                           - Non-bleeding duodenal ulcers with a clean ulcer                            base (Forrest Class III).                           - Normal first portion of the duodenum and second                            portion of the duodenum. Moderate Sedation:       Patient did not receive moderate sedation for this procedure, but       instead received monitored anesthesia care. Recommendation:           - Patient has a contact number available for                            emergencies. The signs and symptoms of potential                            delayed complications were discussed with the                            patient. Return to normal activities tomorrow.                            Written discharge instructions were provided to the                            patient.                           - Resume regular diet.                           - Continue present medications.                           - Repeat upper endoscopy in 1 year for surveillance. Procedure Code(s):        --- Professional ---                           [id number],  Esophagogastroduodenoscopy, flexible,                            transoral; with biopsy, single or multiple Diagnosis Code(s):        --- Professional ---                           I85.00, Esophageal varices without bleeding                           K76.6, Portal hypertension                           K31.89, Other diseases of stomach and duodenum                           K26.9, Duodenal ulcer, unspecified as acute or                            chronic, without hemorrhage or perforation CPT copyright 2019 American Medical Association. All rights reserved. The codes documented in this report are preliminary and upon coder review may  be revised to meet current compliance requirements. Ronnette Juniper, MD 02/10/2021 8:44:44 AM This report has been signed electronically. Number of Addenda: 0

## 2021-02-10 NOTE — Transfer of Care (Signed)
Immediate Anesthesia Transfer of Care Note  Patient: Jimmy Gonzales  Procedure(s) Performed: COLONOSCOPY WITH PROPOFOL (N/A ) ESOPHAGOGASTRODUODENOSCOPY (EGD) WITH PROPOFOL (N/A ) BIOPSY POLYPECTOMY  Patient Location: PACU and Endoscopy Unit  Anesthesia Type:MAC  Level of Consciousness: drowsy and responds to stimulation  Airway & Oxygen Therapy: Patient Spontanous Breathing and Patient connected to face mask oxygen  Post-op Assessment: Report given to RN and Post -op Vital signs reviewed and stable  Post vital signs: Reviewed and stable  Last Vitals:  Vitals Value Taken Time  BP    Temp    Pulse 66 02/10/21 0850  Resp 11 02/10/21 0850  SpO2 98 % 02/10/21 0850  Vitals shown include unvalidated device data.  Last Pain:  Vitals:   02/10/21 0850  TempSrc: Oral  PainSc: Asleep         Complications: No complications documented.

## 2021-02-10 NOTE — Interval H&P Note (Signed)
History and Physical Interval Note: 49/male with esophageal varices, s/p banding in 10/21, screening colonoscopy with propofol.  02/10/2021 7:55 AM  Jimmy Gonzales  has presented today for EGD with banding and colonoscopy with propofol, with the diagnosis of esophageal varices, colon cancer screening.  The various methods of treatment have been discussed with the patient and family. After consideration of risks, benefits and other options for treatment, the patient has consented to  Procedure(s): COLONOSCOPY WITH PROPOFOL (N/A) ESOPHAGOGASTRODUODENOSCOPY (EGD) WITH PROPOFOL (N/A) ESOPHAGEAL BANDING (N/A) as a surgical intervention.  The patient's history has been reviewed, patient examined, no change in status, stable for surgery.  I have reviewed the patient's chart and labs.  Questions were answered to the patient's satisfaction.     Ronnette Juniper

## 2021-02-11 LAB — SURGICAL PATHOLOGY

## 2021-02-12 ENCOUNTER — Encounter (HOSPITAL_COMMUNITY): Payer: Self-pay | Admitting: Gastroenterology

## 2021-03-02 DIAGNOSIS — Z95828 Presence of other vascular implants and grafts: Secondary | ICD-10-CM | POA: Diagnosis not present

## 2021-03-02 DIAGNOSIS — K7030 Alcoholic cirrhosis of liver without ascites: Secondary | ICD-10-CM | POA: Diagnosis not present

## 2021-03-02 DIAGNOSIS — D126 Benign neoplasm of colon, unspecified: Secondary | ICD-10-CM | POA: Diagnosis not present

## 2021-04-06 DIAGNOSIS — D124 Benign neoplasm of descending colon: Secondary | ICD-10-CM | POA: Diagnosis not present

## 2021-04-06 DIAGNOSIS — K7460 Unspecified cirrhosis of liver: Secondary | ICD-10-CM | POA: Diagnosis not present

## 2021-05-08 DIAGNOSIS — K7030 Alcoholic cirrhosis of liver without ascites: Secondary | ICD-10-CM | POA: Diagnosis not present

## 2021-05-08 DIAGNOSIS — K635 Polyp of colon: Secondary | ICD-10-CM | POA: Diagnosis not present

## 2021-05-08 DIAGNOSIS — D369 Benign neoplasm, unspecified site: Secondary | ICD-10-CM | POA: Diagnosis not present

## 2021-06-03 DIAGNOSIS — E039 Hypothyroidism, unspecified: Secondary | ICD-10-CM | POA: Diagnosis not present

## 2021-06-03 DIAGNOSIS — I10 Essential (primary) hypertension: Secondary | ICD-10-CM | POA: Diagnosis not present

## 2021-06-03 DIAGNOSIS — E785 Hyperlipidemia, unspecified: Secondary | ICD-10-CM | POA: Diagnosis not present

## 2021-06-03 DIAGNOSIS — Z79899 Other long term (current) drug therapy: Secondary | ICD-10-CM | POA: Diagnosis not present

## 2021-06-03 DIAGNOSIS — K219 Gastro-esophageal reflux disease without esophagitis: Secondary | ICD-10-CM | POA: Diagnosis not present

## 2021-06-24 ENCOUNTER — Other Ambulatory Visit: Payer: Self-pay | Admitting: Gastroenterology

## 2021-06-24 DIAGNOSIS — K7030 Alcoholic cirrhosis of liver without ascites: Secondary | ICD-10-CM

## 2021-07-15 DIAGNOSIS — K5730 Diverticulosis of large intestine without perforation or abscess without bleeding: Secondary | ICD-10-CM | POA: Diagnosis not present

## 2021-07-15 DIAGNOSIS — K7030 Alcoholic cirrhosis of liver without ascites: Secondary | ICD-10-CM | POA: Diagnosis not present

## 2021-07-15 DIAGNOSIS — D369 Benign neoplasm, unspecified site: Secondary | ICD-10-CM | POA: Diagnosis not present

## 2021-07-15 DIAGNOSIS — I10 Essential (primary) hypertension: Secondary | ICD-10-CM | POA: Diagnosis not present

## 2021-07-15 DIAGNOSIS — D124 Benign neoplasm of descending colon: Secondary | ICD-10-CM | POA: Diagnosis not present

## 2021-07-15 DIAGNOSIS — D123 Benign neoplasm of transverse colon: Secondary | ICD-10-CM | POA: Diagnosis not present

## 2021-07-15 DIAGNOSIS — F1011 Alcohol abuse, in remission: Secondary | ICD-10-CM | POA: Diagnosis not present

## 2021-07-15 DIAGNOSIS — Z720 Tobacco use: Secondary | ICD-10-CM | POA: Diagnosis not present

## 2021-07-15 HISTORY — PX: SIGMOIDOSCOPY: SHX6686

## 2021-07-15 HISTORY — DX: Alcoholic cirrhosis of liver without ascites: K70.30

## 2021-08-04 DIAGNOSIS — K7460 Unspecified cirrhosis of liver: Secondary | ICD-10-CM | POA: Diagnosis not present

## 2021-08-04 DIAGNOSIS — D123 Benign neoplasm of transverse colon: Secondary | ICD-10-CM | POA: Diagnosis not present

## 2021-08-04 DIAGNOSIS — K766 Portal hypertension: Secondary | ICD-10-CM | POA: Diagnosis not present

## 2021-08-04 DIAGNOSIS — K635 Polyp of colon: Secondary | ICD-10-CM | POA: Diagnosis not present

## 2021-08-07 ENCOUNTER — Ambulatory Visit
Admission: RE | Admit: 2021-08-07 | Discharge: 2021-08-07 | Disposition: A | Discharge status: Home / Self Care | Payer: BC Managed Care – PPO | Source: Ambulatory Visit | Attending: Gastroenterology | Admitting: Gastroenterology

## 2021-08-07 DIAGNOSIS — K7030 Alcoholic cirrhosis of liver without ascites: Secondary | ICD-10-CM

## 2021-08-28 DIAGNOSIS — K635 Polyp of colon: Secondary | ICD-10-CM | POA: Diagnosis not present

## 2021-08-28 DIAGNOSIS — K7460 Unspecified cirrhosis of liver: Secondary | ICD-10-CM | POA: Diagnosis not present

## 2021-08-28 DIAGNOSIS — C189 Malignant neoplasm of colon, unspecified: Secondary | ICD-10-CM | POA: Diagnosis not present

## 2021-08-28 HISTORY — PX: MRI: SHX5353

## 2021-10-03 DIAGNOSIS — K7030 Alcoholic cirrhosis of liver without ascites: Secondary | ICD-10-CM | POA: Diagnosis not present

## 2021-10-03 DIAGNOSIS — Z8719 Personal history of other diseases of the digestive system: Secondary | ICD-10-CM | POA: Diagnosis not present

## 2021-10-03 DIAGNOSIS — I10 Essential (primary) hypertension: Secondary | ICD-10-CM | POA: Diagnosis not present

## 2021-10-03 DIAGNOSIS — D509 Iron deficiency anemia, unspecified: Secondary | ICD-10-CM | POA: Diagnosis not present

## 2021-10-03 DIAGNOSIS — E039 Hypothyroidism, unspecified: Secondary | ICD-10-CM | POA: Diagnosis not present

## 2021-10-03 DIAGNOSIS — Z20822 Contact with and (suspected) exposure to covid-19: Secondary | ICD-10-CM | POA: Diagnosis not present

## 2021-10-03 DIAGNOSIS — Z86010 Personal history of colonic polyps: Secondary | ICD-10-CM | POA: Diagnosis not present

## 2021-10-03 DIAGNOSIS — Z79899 Other long term (current) drug therapy: Secondary | ICD-10-CM | POA: Diagnosis not present

## 2021-10-03 DIAGNOSIS — D123 Benign neoplasm of transverse colon: Secondary | ICD-10-CM | POA: Diagnosis not present

## 2021-10-03 DIAGNOSIS — K429 Umbilical hernia without obstruction or gangrene: Secondary | ICD-10-CM | POA: Diagnosis not present

## 2021-10-03 DIAGNOSIS — G8918 Other acute postprocedural pain: Secondary | ICD-10-CM | POA: Diagnosis not present

## 2021-10-05 HISTORY — PX: OTHER SURGICAL HISTORY: SHX169

## 2021-11-03 DIAGNOSIS — D123 Benign neoplasm of transverse colon: Secondary | ICD-10-CM | POA: Diagnosis not present

## 2021-11-03 HISTORY — PX: COLONOSCOPY: SHX174

## 2021-11-04 DIAGNOSIS — K7030 Alcoholic cirrhosis of liver without ascites: Secondary | ICD-10-CM | POA: Diagnosis not present

## 2021-11-04 DIAGNOSIS — E039 Hypothyroidism, unspecified: Secondary | ICD-10-CM | POA: Diagnosis not present

## 2021-11-04 DIAGNOSIS — Z125 Encounter for screening for malignant neoplasm of prostate: Secondary | ICD-10-CM | POA: Diagnosis not present

## 2021-11-04 DIAGNOSIS — I10 Essential (primary) hypertension: Secondary | ICD-10-CM | POA: Diagnosis not present

## 2021-11-04 DIAGNOSIS — Z1331 Encounter for screening for depression: Secondary | ICD-10-CM | POA: Diagnosis not present

## 2021-11-04 DIAGNOSIS — E785 Hyperlipidemia, unspecified: Secondary | ICD-10-CM | POA: Diagnosis not present

## 2021-11-10 DIAGNOSIS — L578 Other skin changes due to chronic exposure to nonionizing radiation: Secondary | ICD-10-CM | POA: Diagnosis not present

## 2021-11-10 DIAGNOSIS — L700 Acne vulgaris: Secondary | ICD-10-CM | POA: Diagnosis not present

## 2021-11-10 DIAGNOSIS — L739 Follicular disorder, unspecified: Secondary | ICD-10-CM | POA: Diagnosis not present

## 2021-12-10 DIAGNOSIS — L700 Acne vulgaris: Secondary | ICD-10-CM | POA: Diagnosis not present

## 2021-12-14 DIAGNOSIS — K7030 Alcoholic cirrhosis of liver without ascites: Secondary | ICD-10-CM | POA: Diagnosis not present

## 2021-12-14 DIAGNOSIS — Z79899 Other long term (current) drug therapy: Secondary | ICD-10-CM | POA: Diagnosis not present

## 2021-12-14 DIAGNOSIS — E785 Hyperlipidemia, unspecified: Secondary | ICD-10-CM | POA: Diagnosis not present

## 2021-12-14 DIAGNOSIS — L700 Acne vulgaris: Secondary | ICD-10-CM | POA: Diagnosis not present

## 2021-12-16 DIAGNOSIS — Z79899 Other long term (current) drug therapy: Secondary | ICD-10-CM | POA: Diagnosis not present

## 2021-12-16 DIAGNOSIS — R531 Weakness: Secondary | ICD-10-CM | POA: Diagnosis not present

## 2021-12-29 ENCOUNTER — Other Ambulatory Visit: Payer: Self-pay | Admitting: Interventional Radiology

## 2021-12-29 DIAGNOSIS — Z95828 Presence of other vascular implants and grafts: Secondary | ICD-10-CM

## 2022-01-07 DIAGNOSIS — Z79899 Other long term (current) drug therapy: Secondary | ICD-10-CM | POA: Diagnosis not present

## 2022-01-07 DIAGNOSIS — R531 Weakness: Secondary | ICD-10-CM | POA: Diagnosis not present

## 2022-02-03 ENCOUNTER — Other Ambulatory Visit: Payer: Self-pay | Admitting: Gastroenterology

## 2022-02-03 DIAGNOSIS — K7030 Alcoholic cirrhosis of liver without ascites: Secondary | ICD-10-CM

## 2022-03-12 DIAGNOSIS — K7460 Unspecified cirrhosis of liver: Secondary | ICD-10-CM | POA: Diagnosis not present

## 2022-03-12 DIAGNOSIS — K7030 Alcoholic cirrhosis of liver without ascites: Secondary | ICD-10-CM | POA: Diagnosis not present

## 2022-03-16 ENCOUNTER — Encounter: Payer: Self-pay | Admitting: *Deleted

## 2022-03-16 ENCOUNTER — Other Ambulatory Visit: Payer: BC Managed Care – PPO

## 2022-03-16 ENCOUNTER — Ambulatory Visit
Admission: RE | Admit: 2022-03-16 | Discharge: 2022-03-16 | Disposition: A | Discharge status: Home / Self Care | Payer: BC Managed Care – PPO | Source: Ambulatory Visit | Attending: Interventional Radiology | Admitting: Interventional Radiology

## 2022-03-16 DIAGNOSIS — Z95828 Presence of other vascular implants and grafts: Secondary | ICD-10-CM

## 2022-03-16 DIAGNOSIS — I8501 Esophageal varices with bleeding: Secondary | ICD-10-CM | POA: Diagnosis not present

## 2022-03-16 DIAGNOSIS — K7030 Alcoholic cirrhosis of liver without ascites: Secondary | ICD-10-CM | POA: Diagnosis not present

## 2022-03-16 DIAGNOSIS — K7460 Unspecified cirrhosis of liver: Secondary | ICD-10-CM | POA: Diagnosis not present

## 2022-03-16 HISTORY — PX: IR RADIOLOGIST EVAL & MGMT: IMG5224

## 2022-03-16 NOTE — Progress Notes (Signed)
Chief Complaint: Patient was seen in consultation today for follow-up after TIPS procedure to treat bleeding esophageal varices.  History of Present Illness: Jimmy Gonzales is a 51 y.o. male with a history of alcoholic cirrhosis who had an emergent TIPS procedure on 05/15/2020 due to persistent bleeding from esophageal varices despite variceal banding.  Duplex ultrasound on 07/08/2020 and 01/06/2021 demonstrated a widely patent TIPS shunt with normal velocities.   Repeat EGD on 02/10/2021 demonstrated small, nonbleeding grade 1 varices in the distal esophagus that did not require banding and mild portal hypertensive gastropathy in the body of the stomach.  Colonoscopy at that time demonstrated a sessile, polypoid mass in the descending colon with biopsy demonstrating no evidence of malignancy.  Due to size of the lesion, he underwent resection of a segment of the descending colon at [facility] on 10/05/2021 with pathology demonstrating focal high-grade dysplasia in a tubulovillous adenoma measuring 3.4 cm with no evidence of invasive carcinoma.  Jimmy Gonzales has been doing well with no complaints.  He denies any hematemesis or melena.  He has no abdominal pain or distention.  He denies any encephalopathic symptoms.  He is getting ready to travel to [place] with his wife and is leaving next week for a 2-week trip.  Past Medical History:  Diagnosis Date   Cirrhosis, alcoholic ([place])    HTN (hypertension)    Hypothyroid     Past Surgical History:  Procedure Laterality Date   BIOPSY  02/10/2021   Procedure: BIOPSY;  Surgeon: Ronnette Juniper, MD;  Location: [facility];  Service: Gastroenterology;;   COLONOSCOPY WITH PROPOFOL N/A 02/10/2021   Procedure: COLONOSCOPY WITH PROPOFOL;  Surgeon: Ronnette Juniper, MD;  Location: [facility];  Service: Gastroenterology;  Laterality: N/A;   ESOPHAGEAL BANDING  07/25/2020   Procedure: ESOPHAGEAL BANDING;  Surgeon: Ronnette Juniper, MD;  Location: [facility];   Service: Gastroenterology;;   ESOPHAGOGASTRODUODENOSCOPY (EGD) WITH PROPOFOL N/A 07/25/2020   Procedure: ESOPHAGOGASTRODUODENOSCOPY (EGD) WITH PROPOFOL;  Surgeon: Ronnette Juniper, MD;  Location: [facility];  Service: Gastroenterology;  Laterality: N/A;   ESOPHAGOGASTRODUODENOSCOPY (EGD) WITH PROPOFOL N/A 02/10/2021   Procedure: ESOPHAGOGASTRODUODENOSCOPY (EGD) WITH PROPOFOL;  Surgeon: Ronnette Juniper, MD;  Location: [facility];  Service: Gastroenterology;  Laterality: N/A;   IR ANGIOGRAM SELECTIVE EACH ADDITIONAL VESSEL  05/16/2020   IR ANGIOGRAM SELECTIVE EACH ADDITIONAL VESSEL  05/16/2020   IR ANGIOGRAM SELECTIVE EACH ADDITIONAL VESSEL  05/16/2020   IR EMBO ART  VEN HEMORR LYMPH EXTRAV  INC GUIDE ROADMAPPING  05/16/2020   IR RADIOLOGIST EVAL & MGMT  07/08/2020   IR RADIOLOGIST EVAL & MGMT  01/06/2021   IR TIPS  05/16/2020   POLYPECTOMY  02/10/2021   Procedure: POLYPECTOMY;  Surgeon: Ronnette Juniper, MD;  Location: [facility];  Service: Gastroenterology;;   RADIOLOGY WITH ANESTHESIA N/A 05/15/2020   Procedure: TIPS PROCEDURE;  Surgeon: Radiologist, Medication, MD;  Location: [place];  Service: Radiology;  Laterality: N/A;   SUBMUCOSAL TATTOO INJECTION  02/10/2021   Procedure: SUBMUCOSAL TATTOO INJECTION;  Surgeon: Ronnette Juniper, MD;  Location: [facility];  Service: Gastroenterology;;    Allergies: Patient has no known allergies.  Medications: Prior to Admission medications   Medication Sig Start Date End Date Taking? Authorizing Provider  amLODipine (NORVASC) 5 MG tablet Take 1 tablet (5 mg total) by mouth daily. 05/29/20   Love, Ivan Anchors, PA-C  FEROSUL 325 (65 Fe) MG tablet Take 325 mg by mouth 2 (two) times daily. 07/11/20   [provider]  fexofenadine Delma Freeze)  180 MG tablet Take 180 mg by mouth daily.    [provider]  furosemide (LASIX) 20 MG tablet Take 20 mg by mouth daily.    [provider]  lactulose (CHRONULAC) 10 GM/15ML solution Take 30 mLs (20 g total) by mouth  2 (two) times daily. 05/29/20   Love, Ivan Anchors, PA-C  levothyroxine (SYNTHROID) 25 MCG tablet Take 1 tablet (25 mcg total) by mouth daily at 6 (six) AM. Patient taking differently: Take 25 mcg by mouth daily before breakfast. 05/29/20   Love, Ivan Anchors, PA-C  Multiple Vitamins-Minerals (MULTIVITAMIN WITH MINERALS) tablet Take 1 tablet by mouth daily.    [provider]  Naphazoline-Pheniramine (OPCON-A) 0.027-0.315 % SOLN Place 1 drop into both eyes daily as needed (dry eyes/irritation).    [provider]  pantoprazole (PROTONIX) 40 MG tablet Take 1 tablet (40 mg total) by mouth 2 (two) times daily. Patient not taking: No sig reported 05/29/20   Love, Ivan Anchors, PA-C  pantoprazole (PROTONIX) 40 MG tablet Take 1 tablet (40 mg total) by mouth daily. 02/10/21 02/10/22  Ronnette Juniper, MD  potassium chloride SA (KLOR-CON) 20 MEQ tablet Take 1 tablet (20 mEq total) by mouth daily. 05/29/20   Love, Ivan Anchors, PA-C  rifaximin (XIFAXAN) 550 MG TABS tablet Take 1 tablet (550 mg total) by mouth 2 (two) times daily. 05/29/20   Love, Ivan Anchors, PA-C  rosuvastatin (CRESTOR) 10 MG tablet Take 10 mg by mouth daily. 01/28/21   [provider]  thiamine 100 MG tablet Take 1 tablet (100 mg total) by mouth daily. 05/29/20   Love, Ivan Anchors, PA-C     Family History  Problem Relation Age of Onset   Lung cancer Mother    Congestive Heart Failure Father     Social History   Socioeconomic History   Marital status: Married    Spouse name: Farid Grigorian    Number of children: Not on file   Years of education: Not on file   Highest education level: Not on file  Occupational History   Not on file  Tobacco Use   Smoking status: Unknown   Smokeless tobacco: Current    Types: Chew  Substance and Sexual Activity   Alcohol use: Yes    Comment: "drinks 1 gallon of booze a day"   Drug use: Not on file   Sexual activity: Not on file  Other Topics Concern   Not on file  Social History Narrative   Not on  file   Social Determinants of Health   Financial Resource Strain: Not on file  Food Insecurity: Not on file  Transportation Needs: Not on file  Physical Activity: Not on file  Stress: Not on file  Social Connections: Not on file    Review of Systems: A 12 point ROS discussed and pertinent positives are indicated in the HPI above.  All other systems are negative.  Review of Systems  Constitutional: Negative.   Respiratory: Negative.    Cardiovascular: Negative.   Gastrointestinal: Negative.   Genitourinary: Negative.   Musculoskeletal: Negative.   Neurological: Negative.     Vital Signs: There were no vitals taken for this visit.  Physical Exam Constitutional:      General: He is not in acute distress.    Appearance: Normal appearance. He is not toxic-appearing or diaphoretic.  Neurological:     General: No focal deficit present.     Mental Status: He is alert and oriented to person, place, and time.  Imaging: US ABDOMINAL PELVIC ART/VENT FLOW DOPPLER  Result Date: 03/16/2022 CLINICAL DATA:  History of cirrhosis and status post TIPS placement on 05/16/2020 to treat esophageal variceal bleeding. EXAM: DUPLEX ULTRASOUND OF LIVER AND TIPS SHUNT TECHNIQUE: Color and duplex Doppler ultrasound was performed to evaluate the hepatic in-flow and out-flow vessels. COMPARISON:  01/06/2021 FINDINGS: Portal Vein Velocities Main:  44 cm/sec Right:  18 cm/sec Left:  18 cm/sec TIPS Stent Velocities Proximal:  182 cm/sec Mid:  174 Distal:  207 cm/sec IVC: Present and patent with normal respiratory phasicity. Hepatic Vein Velocities Right:  18 cm/sec Mid:  28 cm/sec Left:  Difficult to visualize. Splenic Vein: 72 cm/sec Hepatic Artery: 70 cm/sec Ascities: None visualized. Varices: None visualized. Other findings: The liver again demonstrates evidence underlying cirrhosis. Nodularity noted without discrete hepatic mass. Area of slightly decreased echogenicity in the left lobe is ill-defined  and may represent an area of relative fatty sparing. No evidence of portal vein thrombus. Portal vein flow is towards the liver. The TIPS shunt demonstrates normal patency and waveforms with no evidence to suggest stenosis. IMPRESSION: Widely patent TIPS shunt with normal velocities and waveforms. Electronically Signed   By: Aletta Edouard M.D.   On: 03/16/2022 09:52    Assessment and Plan:  I met with Jimmy Gonzales and his wife and we reviewed duplex ultrasound findings from today.  He is doing very well after prior TIPS nearly 2 years ago with no recurrent variceal bleeding since that time or need for additional esophageal variceal banding.  He has had no problems with encephalopathy post TIPS.  The TIPS shunt is widely patent by duplex ultrasound.  I recommended extending the interval for duplex surveillance to 2 years from now unless there is an acute change in clinical status to warrant surveillance.   Electronically Signed: Azzie Roup 03/16/2022, 9:53 AM    I spent a total of  10 Minutes in face to face in clinical consultation, greater than 50% of which was counseling/coordinating care post TIPS.

## 2022-05-05 DIAGNOSIS — K7290 Hepatic failure, unspecified without coma: Secondary | ICD-10-CM | POA: Diagnosis not present

## 2022-05-05 DIAGNOSIS — E785 Hyperlipidemia, unspecified: Secondary | ICD-10-CM | POA: Diagnosis not present

## 2022-05-05 DIAGNOSIS — E039 Hypothyroidism, unspecified: Secondary | ICD-10-CM | POA: Diagnosis not present

## 2022-05-05 DIAGNOSIS — I10 Essential (primary) hypertension: Secondary | ICD-10-CM | POA: Diagnosis not present

## 2022-05-05 DIAGNOSIS — K7030 Alcoholic cirrhosis of liver without ascites: Secondary | ICD-10-CM | POA: Diagnosis not present

## 2022-05-05 DIAGNOSIS — Z1331 Encounter for screening for depression: Secondary | ICD-10-CM | POA: Diagnosis not present

## 2022-06-16 DIAGNOSIS — D126 Benign neoplasm of colon, unspecified: Secondary | ICD-10-CM | POA: Diagnosis not present

## 2022-06-16 DIAGNOSIS — K7469 Other cirrhosis of liver: Secondary | ICD-10-CM | POA: Diagnosis not present

## 2022-06-16 DIAGNOSIS — Z95828 Presence of other vascular implants and grafts: Secondary | ICD-10-CM | POA: Diagnosis not present

## 2022-10-14 DIAGNOSIS — D122 Benign neoplasm of ascending colon: Secondary | ICD-10-CM | POA: Diagnosis not present

## 2022-10-14 DIAGNOSIS — Z980 Intestinal bypass and anastomosis status: Secondary | ICD-10-CM | POA: Diagnosis not present

## 2022-10-14 DIAGNOSIS — K5730 Diverticulosis of large intestine without perforation or abscess without bleeding: Secondary | ICD-10-CM | POA: Diagnosis not present

## 2022-10-14 DIAGNOSIS — Z86010 Personal history of colonic polyps: Secondary | ICD-10-CM | POA: Diagnosis not present

## 2022-10-14 DIAGNOSIS — D123 Benign neoplasm of transverse colon: Secondary | ICD-10-CM | POA: Diagnosis not present

## 2022-10-14 DIAGNOSIS — K648 Other hemorrhoids: Secondary | ICD-10-CM | POA: Diagnosis not present

## 2022-10-14 DIAGNOSIS — K31819 Angiodysplasia of stomach and duodenum without bleeding: Secondary | ICD-10-CM | POA: Diagnosis not present

## 2022-10-14 DIAGNOSIS — I8500 Esophageal varices without bleeding: Secondary | ICD-10-CM | POA: Diagnosis not present

## 2022-11-03 DIAGNOSIS — E785 Hyperlipidemia, unspecified: Secondary | ICD-10-CM | POA: Diagnosis not present

## 2022-11-03 DIAGNOSIS — K7290 Hepatic failure, unspecified without coma: Secondary | ICD-10-CM | POA: Diagnosis not present

## 2022-11-03 DIAGNOSIS — K7030 Alcoholic cirrhosis of liver without ascites: Secondary | ICD-10-CM | POA: Diagnosis not present

## 2022-11-03 DIAGNOSIS — E039 Hypothyroidism, unspecified: Secondary | ICD-10-CM | POA: Diagnosis not present

## 2022-11-03 DIAGNOSIS — I10 Essential (primary) hypertension: Secondary | ICD-10-CM | POA: Diagnosis not present

## 2022-11-03 DIAGNOSIS — D509 Iron deficiency anemia, unspecified: Secondary | ICD-10-CM | POA: Diagnosis not present

## 2023-03-03 ENCOUNTER — Other Ambulatory Visit: Payer: Self-pay | Admitting: Gastroenterology

## 2023-03-03 DIAGNOSIS — Z86010 Personal history of colonic polyps: Secondary | ICD-10-CM | POA: Diagnosis not present

## 2023-03-03 DIAGNOSIS — K7469 Other cirrhosis of liver: Secondary | ICD-10-CM

## 2023-03-17 ENCOUNTER — Ambulatory Visit
Admission: RE | Admit: 2023-03-17 | Discharge: 2023-03-17 | Disposition: A | Discharge status: Home / Self Care | Payer: Self-pay | Source: Ambulatory Visit | Attending: Gastroenterology | Admitting: Gastroenterology

## 2023-03-17 DIAGNOSIS — K7469 Other cirrhosis of liver: Secondary | ICD-10-CM

## 2023-05-04 DIAGNOSIS — K7290 Hepatic failure, unspecified without coma: Secondary | ICD-10-CM | POA: Diagnosis not present

## 2023-05-04 DIAGNOSIS — E039 Hypothyroidism, unspecified: Secondary | ICD-10-CM | POA: Diagnosis not present

## 2023-05-04 DIAGNOSIS — E785 Hyperlipidemia, unspecified: Secondary | ICD-10-CM | POA: Diagnosis not present

## 2023-05-04 DIAGNOSIS — I10 Essential (primary) hypertension: Secondary | ICD-10-CM | POA: Diagnosis not present

## 2023-05-04 DIAGNOSIS — K7030 Alcoholic cirrhosis of liver without ascites: Secondary | ICD-10-CM | POA: Diagnosis not present

## 2023-07-04 DIAGNOSIS — L700 Acne vulgaris: Secondary | ICD-10-CM | POA: Diagnosis not present

## 2023-07-04 DIAGNOSIS — L821 Other seborrheic keratosis: Secondary | ICD-10-CM | POA: Diagnosis not present

## 2023-07-04 DIAGNOSIS — D225 Melanocytic nevi of trunk: Secondary | ICD-10-CM | POA: Diagnosis not present

## 2023-07-04 DIAGNOSIS — L578 Other skin changes due to chronic exposure to nonionizing radiation: Secondary | ICD-10-CM | POA: Diagnosis not present

## 2023-07-04 DIAGNOSIS — D485 Neoplasm of uncertain behavior of skin: Secondary | ICD-10-CM | POA: Diagnosis not present

## 2023-07-21 DIAGNOSIS — D045 Carcinoma in situ of skin of trunk: Secondary | ICD-10-CM | POA: Diagnosis not present

## 2023-08-26 IMAGING — US US ABDOMEN LIMITED
1 series · 14 of 25 positions shown · non-contrast
Comparison: None.

CLINICAL DATA: Alcoholic cirrhosis of the liver.

EXAM:
ULTRASOUND ABDOMEN LIMITED RIGHT UPPER QUADRANT

[Series 1: us abdomen limited · 0.26mm/px · 14 of 50 slices shown]
[im 1/50]
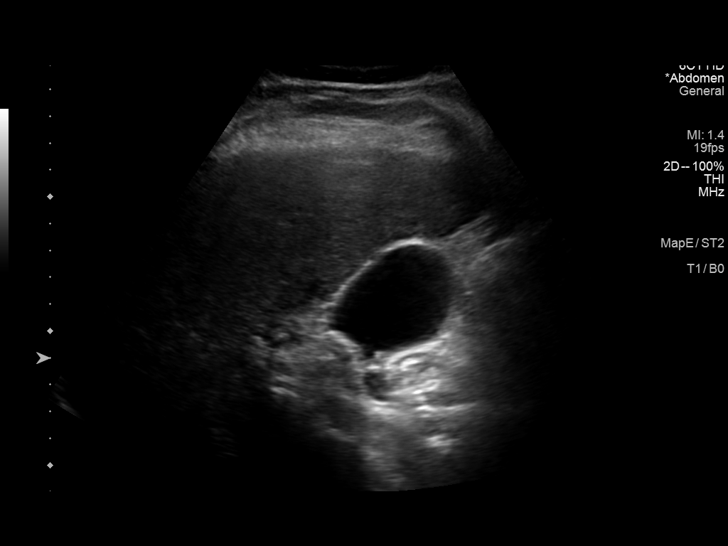
[im 5/50]
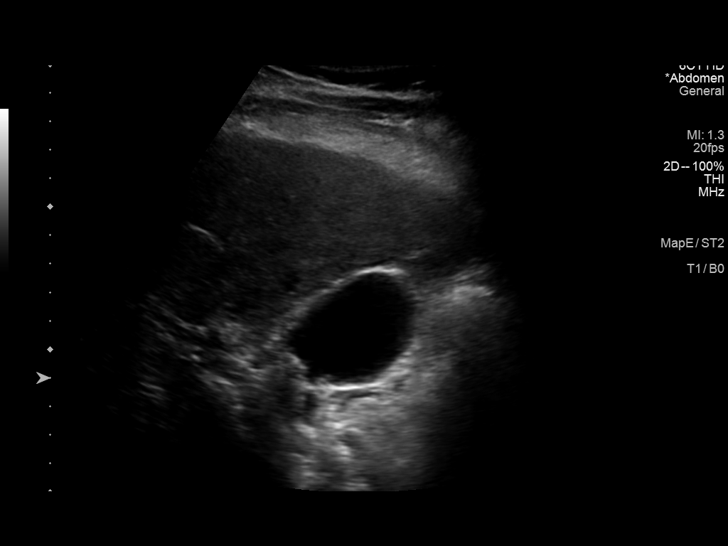
[im 9/50]
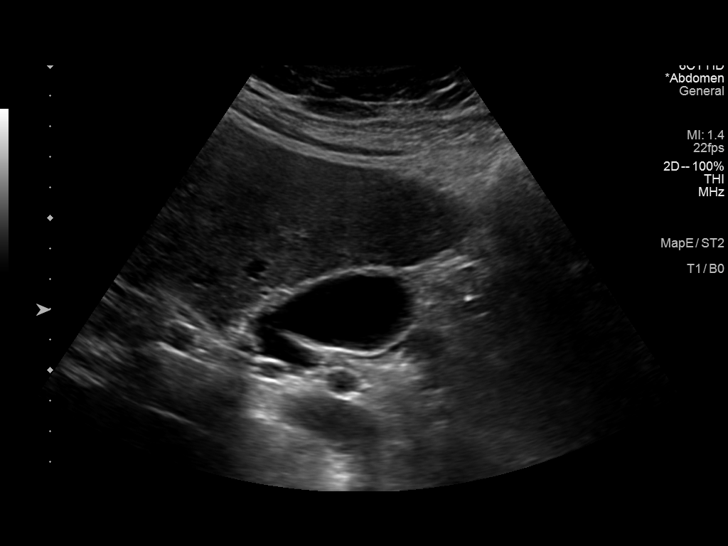
[im 13/50]
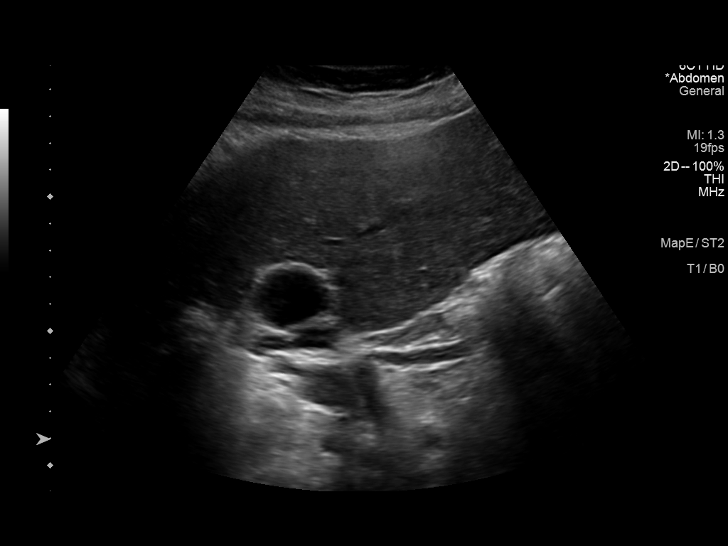
[im 17/50]
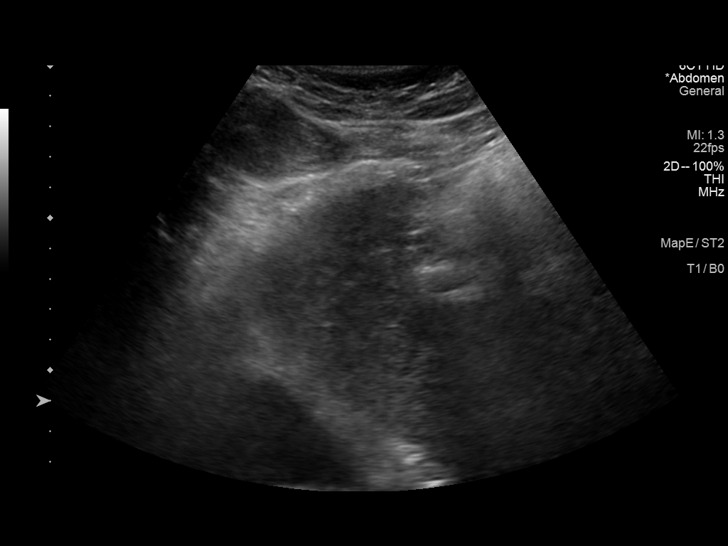
[im 19/50]
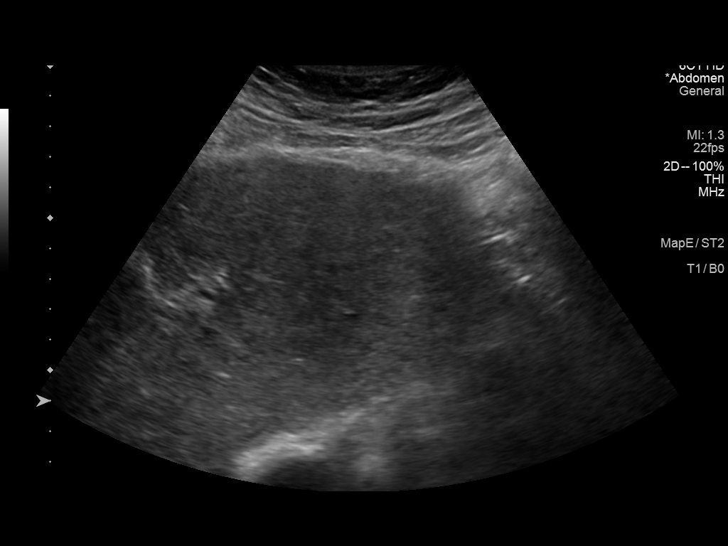
[im 23/50]
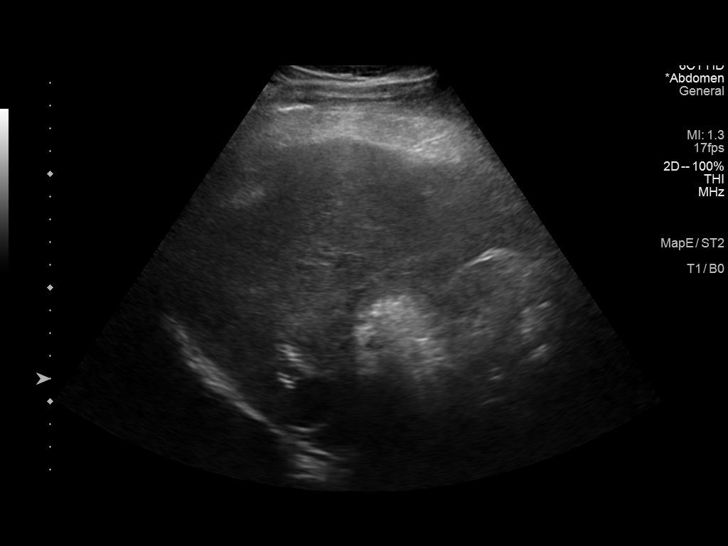
[im 27/50]
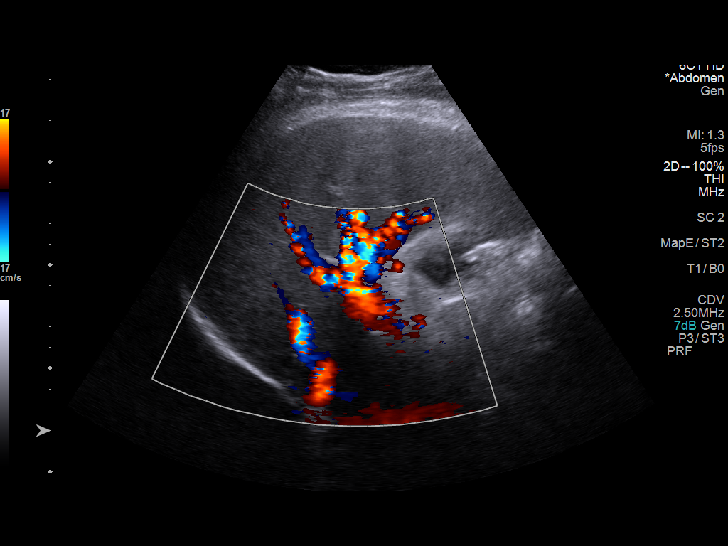
[im 31/50]
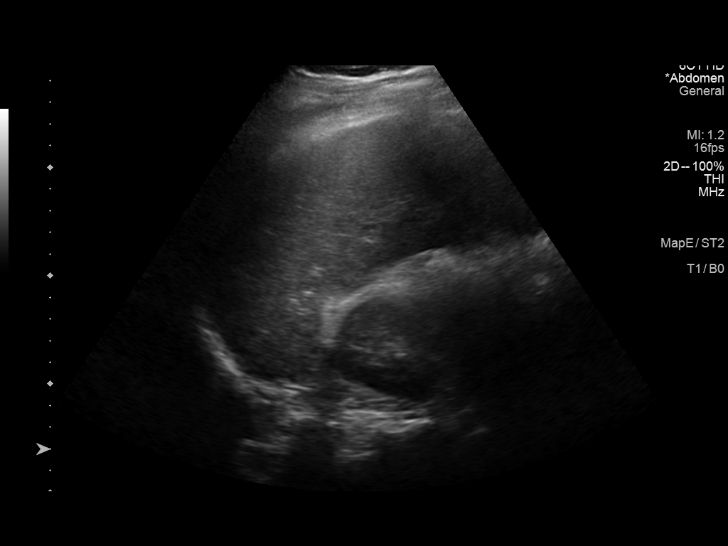
[im 33/50]
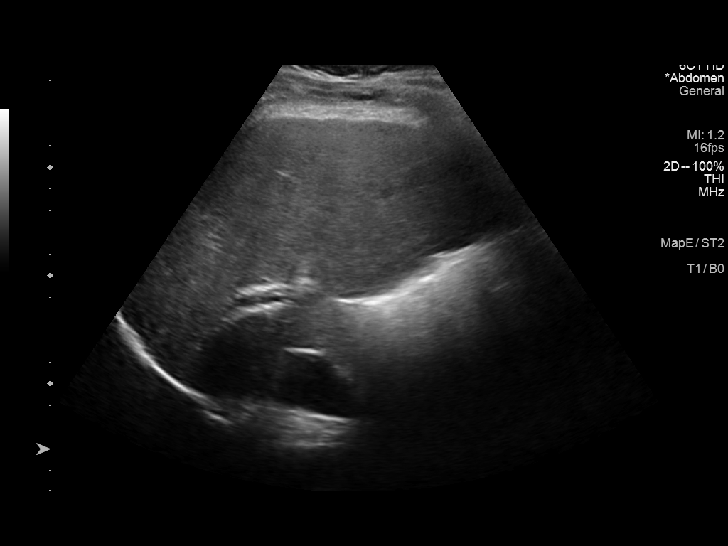
[im 37/50]
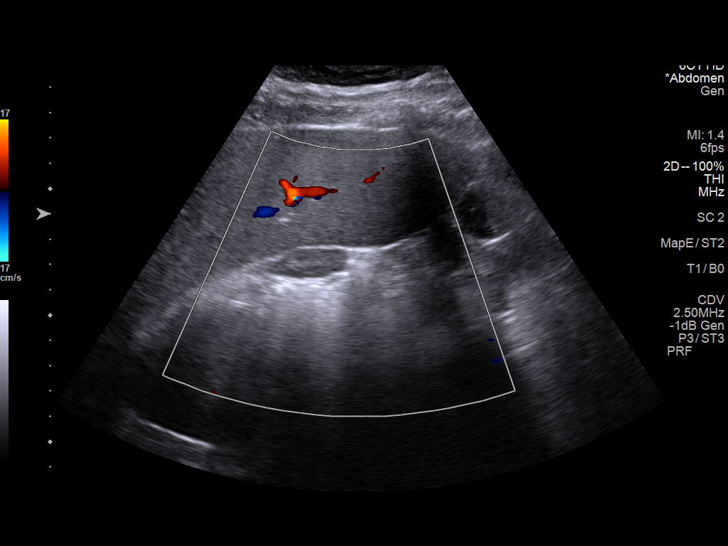
[im 41/50]
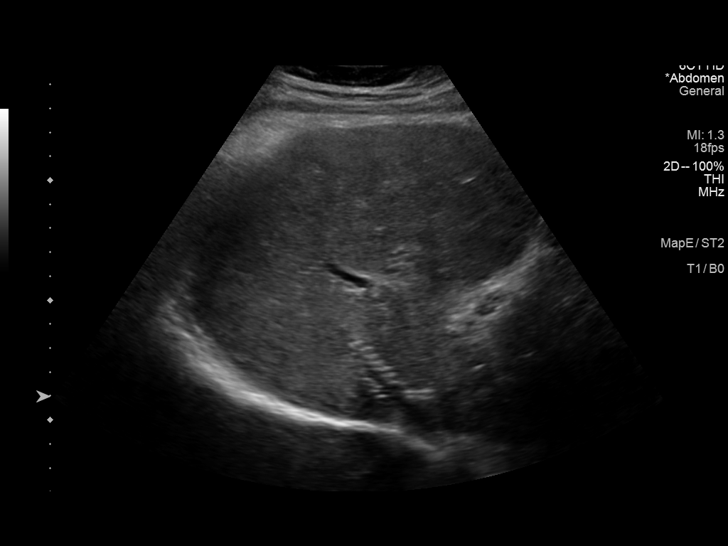
[im 45/50]
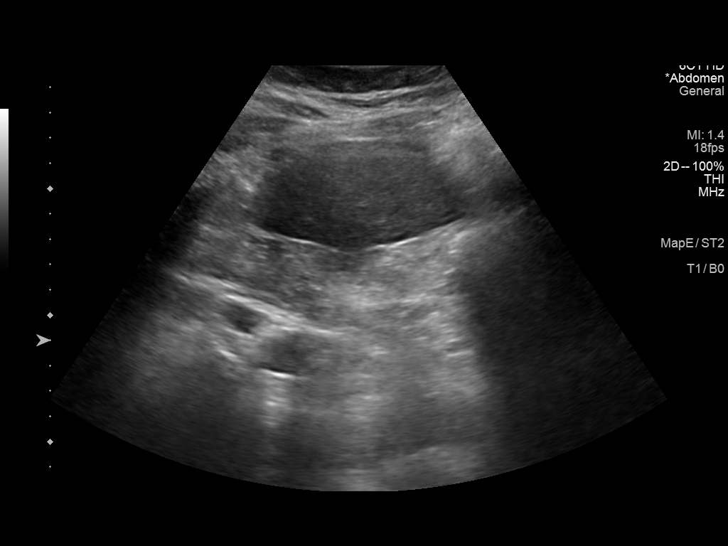
[im 50/50]
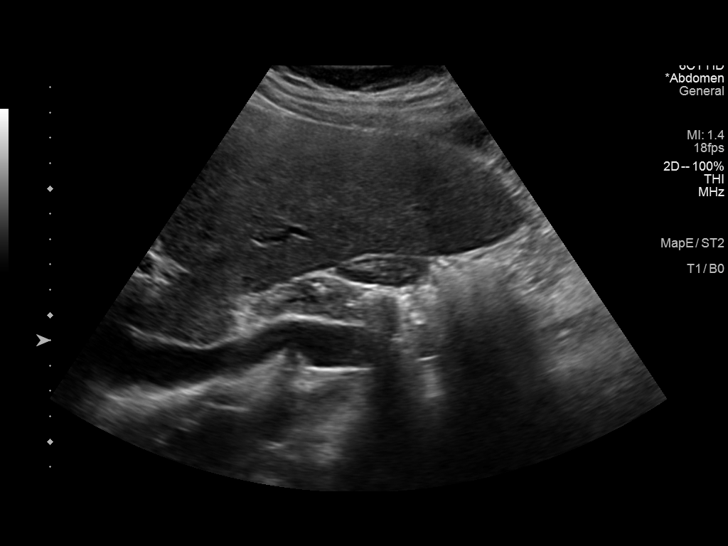

[14 of 25 positions shown; findings below may reference images not displayed]

FINDINGS: Gallbladder:

No gallstones or wall thickening visualized. No sonographic Murphy
sign noted by sonographer.

Common bile duct:

Diameter: 4.3 mm.

Liver:

No focal lesion identified. There is heterogeneous nodular contour
with increased parenchymal echogenicity. TIPS present. Portal vein
is patent on color Doppler imaging with normal direction of blood
flow towards the liver.

Other: None.
IMPRESSION: 1. Heterogeneous nodular contour of liver with increased parenchymal
echogenicity likely related to patient's known cirrhosis.
2. No focal liver lesion.
3. TIPS present.
4. No cholelithiasis.

## 2023-10-25 ENCOUNTER — Other Ambulatory Visit: Payer: Self-pay | Admitting: Gastroenterology

## 2023-10-25 ENCOUNTER — Encounter: Payer: Self-pay | Admitting: Gastroenterology

## 2023-10-25 DIAGNOSIS — K7460 Unspecified cirrhosis of liver: Secondary | ICD-10-CM | POA: Diagnosis not present

## 2023-10-25 DIAGNOSIS — K7030 Alcoholic cirrhosis of liver without ascites: Secondary | ICD-10-CM

## 2023-10-25 DIAGNOSIS — K219 Gastro-esophageal reflux disease without esophagitis: Secondary | ICD-10-CM | POA: Diagnosis not present

## 2023-11-01 DIAGNOSIS — K7030 Alcoholic cirrhosis of liver without ascites: Secondary | ICD-10-CM | POA: Diagnosis not present

## 2023-11-01 DIAGNOSIS — I10 Essential (primary) hypertension: Secondary | ICD-10-CM | POA: Diagnosis not present

## 2023-11-01 DIAGNOSIS — E039 Hypothyroidism, unspecified: Secondary | ICD-10-CM | POA: Diagnosis not present

## 2023-11-01 DIAGNOSIS — E785 Hyperlipidemia, unspecified: Secondary | ICD-10-CM | POA: Diagnosis not present

## 2023-11-15 ENCOUNTER — Ambulatory Visit
Admission: RE | Admit: 2023-11-15 | Discharge: 2023-11-15 | Disposition: A | Discharge status: Home / Self Care | Payer: BC Managed Care – PPO | Source: Ambulatory Visit | Attending: Gastroenterology | Admitting: Gastroenterology

## 2023-11-15 DIAGNOSIS — K7030 Alcoholic cirrhosis of liver without ascites: Secondary | ICD-10-CM

## 2023-11-15 DIAGNOSIS — K7460 Unspecified cirrhosis of liver: Secondary | ICD-10-CM | POA: Diagnosis not present

## 2024-02-27 ENCOUNTER — Other Ambulatory Visit: Payer: Self-pay | Admitting: Gastroenterology

## 2024-02-27 DIAGNOSIS — K7030 Alcoholic cirrhosis of liver without ascites: Secondary | ICD-10-CM

## 2024-03-10 ENCOUNTER — Encounter (HOSPITAL_BASED_OUTPATIENT_CLINIC_OR_DEPARTMENT_OTHER): Payer: Self-pay | Admitting: Emergency Medicine

## 2024-03-10 ENCOUNTER — Ambulatory Visit (HOSPITAL_BASED_OUTPATIENT_CLINIC_OR_DEPARTMENT_OTHER): Admitting: Radiology

## 2024-03-10 ENCOUNTER — Ambulatory Visit (HOSPITAL_BASED_OUTPATIENT_CLINIC_OR_DEPARTMENT_OTHER)
Admission: EM | Admit: 2024-03-10 | Discharge: 2024-03-10 | Disposition: A | Discharge status: Home / Self Care | Attending: Family Medicine | Admitting: Family Medicine

## 2024-03-10 DIAGNOSIS — M25562 Pain in left knee: Secondary | ICD-10-CM

## 2024-03-10 DIAGNOSIS — S8992XA Unspecified injury of left lower leg, initial encounter: Secondary | ICD-10-CM | POA: Diagnosis not present

## 2024-03-10 DIAGNOSIS — S82142A Displaced bicondylar fracture of left tibia, initial encounter for closed fracture: Secondary | ICD-10-CM | POA: Diagnosis not present

## 2024-03-10 DIAGNOSIS — M25462 Effusion, left knee: Secondary | ICD-10-CM | POA: Diagnosis not present
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
## 2024-03-10 NOTE — Discharge Instructions (Signed)
 You do have a fracture.  We are immobilized in the knee here.  Recommend nonweightbearing and using crutches for now.  Please follow-up with the orthopedic on Monday Rest, ice, elevate and you can take ibuprofen for pain as needed. Follow-up as needed

## 2024-03-12 ENCOUNTER — Other Ambulatory Visit: Payer: Self-pay | Admitting: Physician Assistant

## 2024-03-12 ENCOUNTER — Ambulatory Visit

## 2024-03-12 ENCOUNTER — Ambulatory Visit
Admission: RE | Admit: 2024-03-12 | Discharge: 2024-03-12 | Disposition: A | Discharge status: Home / Self Care | Source: Ambulatory Visit | Attending: Physician Assistant | Admitting: Physician Assistant

## 2024-03-12 DIAGNOSIS — M25562 Pain in left knee: Secondary | ICD-10-CM

## 2024-03-12 DIAGNOSIS — S82202A Unspecified fracture of shaft of left tibia, initial encounter for closed fracture: Secondary | ICD-10-CM | POA: Diagnosis not present
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
## 2024-03-14 DIAGNOSIS — S82112A Displaced fracture of left tibial spine, initial encounter for closed fracture: Secondary | ICD-10-CM | POA: Diagnosis not present

## 2024-03-14 DIAGNOSIS — S82142A Displaced bicondylar fracture of left tibia, initial encounter for closed fracture: Secondary | ICD-10-CM | POA: Diagnosis not present

## 2024-03-15 ENCOUNTER — Other Ambulatory Visit

## 2024-03-19 ENCOUNTER — Other Ambulatory Visit: Payer: Self-pay

## 2024-03-19 ENCOUNTER — Encounter (HOSPITAL_COMMUNITY): Payer: Self-pay | Admitting: Orthopedic Surgery

## 2024-03-19 NOTE — Progress Notes (Signed)
 Anesthesia Chart Review: Same day workup  53 year old male with pertinent history including HTN, hypothyroid, alcoholic cirrhosis s/p TIPS, GERD on PPI.  S/p emergent TIPS procedure on 05/15/2020 due to persistent bleeding from esophageal varices despite variceal banding. EGD 09/2022: Grade 1 esophageal varices, 4 small nonbleeding AVMs in gastric body and antrum. Ultrasound abdomen 03/23/2023: Patent TIPS, no liver lesion, hepatic steatosis.  Last seen by gastroenterologist Dr. Saintclair on 10/17/2023.  Per note, Advised to continue lactulose  twice a day and Xifaxan  twice a day. He has TIPS, remains at risk for hepatic encephalopathy. No signs of ascites, has been successfully weaned off diuretics.  Patient will need day of surgery labs and evaluation.    Lynwood Geofm RIGGERS [facility] Short Stay Center/Anesthesiology Phone [phone] 03/19/2024 9:22 AM

## 2024-03-19 NOTE — Progress Notes (Signed)
 PCP - Dr Garnette Shams Cardiologist - none GI - Dr Saintclair  Chest x-ray - n/a EKG - DOS Stress Test - n/a ECHO - n/a Cardiac Cath - n/a  ICD Pacemaker/Loop - n/a  Sleep Study -  n/a CPAP - none  Diabetes - n/a  Aspirin & Blood Thinner Instructions:  n/a  ERAS -  Anesthesia review: Yes  STOP now taking any Aspirin (unless otherwise instructed by your surgeon), Aleve, Naproxen, Ibuprofen, Motrin, Advil, Goody's, BC's, all herbal medications, fish oil, and all vitamins.   Coronavirus Screening Does the patient have any of the following symptoms:  Cough yes/no: No Fever (>100.45F)  yes/no: No Runny nose yes/no: No Sore throat yes/no: No Difficulty breathing/shortness of breath  yes/no: No  Has the patient traveled in the last 14 days and where? yes/no: No  Patient's wife Lenward verbalized understanding of instructions that were given via phone.

## 2024-03-19 NOTE — Anesthesia Preprocedure Evaluation (Addendum)
 Anesthesia Evaluation  Patient identified by MRN, date of birth, ID band Patient awake    Reviewed: Allergy & Precautions, NPO status , Patient's Chart, lab work & pertinent test results  Airway Mallampati: II  TM Distance: >3 FB Neck ROM: Full    Dental  (+) Teeth Intact, Dental Advisory Given   Pulmonary neg pulmonary ROS   Pulmonary exam normal breath sounds clear to auscultation       Cardiovascular hypertension, Pt. on medications Normal cardiovascular exam Rhythm:Regular Rate:Normal     Neuro/Psych negative neurological ROS     GI/Hepatic ,GERD  Medicated,,(+) Cirrhosis  (s/p TIPS)  Esophageal Varices  substance abuse  alcohol use  Endo/Other  Hypothyroidism    Renal/GU negative Renal ROS     Musculoskeletal FRACTURE LEFT TIBIAL PLATEAU AND EMINENCE   Abdominal   Peds  Hematology negative hematology ROS (+)   Anesthesia Other Findings   Reproductive/Obstetrics                             Anesthesia Physical Anesthesia Plan  ASA: 3  Anesthesia Plan: General   Post-op Pain Management: Toradol IV (intra-op)*   Induction: Intravenous  PONV Risk Score and Plan: 3 and Midazolam , Dexamethasone and Ondansetron   Airway Management Planned: Oral ETT  Additional Equipment:   Intra-op Plan:   Post-operative Plan: Extubation in OR  Informed Consent: I have reviewed the patients History and Physical, chart, labs and discussed the procedure including the risks, benefits and alternatives for the proposed anesthesia with the patient or authorized representative who has indicated his/her understanding and acceptance.     Dental advisory given  Plan Discussed with: CRNA  Anesthesia Plan Comments: (PAT note by Lynwood Hope, PA-C: 53 year old male with pertinent history including HTN, hypothyroid, alcoholic cirrhosis s/p TIPS, GERD on PPI.  S/p emergent TIPSprocedureon  8/19/2021due to persistent bleeding from esophageal varices despite variceal banding. EGD 09/2022: Grade 1 esophageal varices, 4 small nonbleeding AVMs in gastric body and antrum. Ultrasound abdomen 03/23/2023: Patent TIPS, no liver lesion, hepatic steatosis.  Last seen by gastroenterologist Dr. Saintclair on 10/17/2023.  Per note, Advised to continue lactulose  twice a day and Xifaxan  twice a day.He has TIPS, remains at risk for hepatic encephalopathy.No signs of ascites, has been successfully weaned off diuretics.  Patient will need day of surgery labs and evaluation.   )        Anesthesia Quick Evaluation

## 2024-03-20 ENCOUNTER — Other Ambulatory Visit (HOSPITAL_COMMUNITY): Payer: Self-pay

## 2024-03-20 ENCOUNTER — Ambulatory Visit (HOSPITAL_COMMUNITY)

## 2024-03-20 ENCOUNTER — Ambulatory Visit (HOSPITAL_COMMUNITY): Payer: Self-pay | Admitting: Physician Assistant

## 2024-03-20 ENCOUNTER — Ambulatory Visit (HOSPITAL_COMMUNITY): Admission: RE | Admit: 2024-03-20 | Source: Home / Self Care | Admitting: Orthopedic Surgery

## 2024-03-20 ENCOUNTER — Other Ambulatory Visit: Payer: Self-pay

## 2024-03-20 ENCOUNTER — Encounter (HOSPITAL_COMMUNITY): Payer: Self-pay | Admitting: Orthopedic Surgery

## 2024-03-20 ENCOUNTER — Encounter (HOSPITAL_COMMUNITY)
Admission: RE | Disposition: A | Discharge status: Home / Self Care | Payer: Self-pay | Source: Home / Self Care | Attending: Orthopedic Surgery

## 2024-03-20 DIAGNOSIS — S82122A Displaced fracture of lateral condyle of left tibia, initial encounter for closed fracture: Secondary | ICD-10-CM | POA: Diagnosis not present

## 2024-03-20 DIAGNOSIS — X58XXXA Exposure to other specified factors, initial encounter: Secondary | ICD-10-CM | POA: Diagnosis not present

## 2024-03-20 DIAGNOSIS — E785 Hyperlipidemia, unspecified: Secondary | ICD-10-CM | POA: Insufficient documentation

## 2024-03-20 DIAGNOSIS — E039 Hypothyroidism, unspecified: Secondary | ICD-10-CM | POA: Insufficient documentation

## 2024-03-20 DIAGNOSIS — I8510 Secondary esophageal varices without bleeding: Secondary | ICD-10-CM | POA: Diagnosis not present

## 2024-03-20 DIAGNOSIS — K7030 Alcoholic cirrhosis of liver without ascites: Secondary | ICD-10-CM | POA: Insufficient documentation

## 2024-03-20 DIAGNOSIS — Z79899 Other long term (current) drug therapy: Secondary | ICD-10-CM | POA: Insufficient documentation

## 2024-03-20 DIAGNOSIS — R609 Edema, unspecified: Secondary | ICD-10-CM | POA: Diagnosis not present

## 2024-03-20 DIAGNOSIS — K219 Gastro-esophageal reflux disease without esophagitis: Secondary | ICD-10-CM | POA: Diagnosis not present

## 2024-03-20 DIAGNOSIS — Z9889 Other specified postprocedural states: Secondary | ICD-10-CM | POA: Diagnosis not present

## 2024-03-20 DIAGNOSIS — S82142A Displaced bicondylar fracture of left tibia, initial encounter for closed fracture: Secondary | ICD-10-CM | POA: Insufficient documentation

## 2024-03-20 DIAGNOSIS — S82112A Displaced fracture of left tibial spine, initial encounter for closed fracture: Secondary | ICD-10-CM | POA: Insufficient documentation

## 2024-03-20 DIAGNOSIS — Z01818 Encounter for other preprocedural examination: Secondary | ICD-10-CM

## 2024-03-20 DIAGNOSIS — Z79890 Hormone replacement therapy (postmenopausal): Secondary | ICD-10-CM | POA: Diagnosis not present

## 2024-03-20 DIAGNOSIS — S82102A Unspecified fracture of upper end of left tibia, initial encounter for closed fracture: Secondary | ICD-10-CM | POA: Diagnosis not present

## 2024-03-20 DIAGNOSIS — I10 Essential (primary) hypertension: Secondary | ICD-10-CM | POA: Diagnosis not present

## 2024-03-20 HISTORY — DX: Encounter for other specified aftercare: Z51.89

## 2024-03-20 HISTORY — DX: Hyperlipidemia, unspecified: E78.5

## 2024-03-20 HISTORY — DX: Anemia, unspecified: D64.9

## 2024-03-20 HISTORY — DX: Gastro-esophageal reflux disease without esophagitis: K21.9

## 2024-03-20 HISTORY — PX: ORIF TIBIA PLATEAU: SHX2132

## 2024-03-20 HISTORY — PX: ORIF TIBIA FRACTURE: SHX5416

## 2024-03-20 LAB — CBC WITH DIFFERENTIAL/PLATELET
Abs Immature Granulocytes: 0.03 10*3/uL (ref 0.00–0.07)
Basophils Absolute: 0.1 10*3/uL (ref 0.0–0.1)
Basophils Relative: 1 %
Eosinophils Absolute: 0.4 10*3/uL (ref 0.0–0.5)
Eosinophils Relative: 5 %
HCT: 38.3 % — ABNORMAL LOW (ref 39.0–52.0)
Hemoglobin: 13.6 g/dL (ref 13.0–17.0)
Immature Granulocytes: 0 %
Lymphocytes Relative: 33 %
Lymphs Abs: 2.2 10*3/uL (ref 0.7–4.0)
MCH: 30 pg (ref 26.0–34.0)
MCHC: 35.5 g/dL (ref 30.0–36.0)
MCV: 84.5 fL (ref 80.0–100.0)
Monocytes Absolute: 0.5 10*3/uL (ref 0.1–1.0)
Monocytes Relative: 7 %
Neutro Abs: 3.6 10*3/uL (ref 1.7–7.7)
Neutrophils Relative %: 54 %
Platelets: 240 10*3/uL (ref 150–400)
RBC: 4.53 MIL/uL (ref 4.22–5.81)
RDW: 14.1 % (ref 11.5–15.5)
WBC: 6.7 10*3/uL (ref 4.0–10.5)
nRBC: 0 % (ref 0.0–0.2)

## 2024-03-20 LAB — PROTIME-INR
INR: 1.1 (ref 0.8–1.2)
Prothrombin Time: 14.5 s (ref 11.4–15.2)

## 2024-03-20 LAB — COMPREHENSIVE METABOLIC PANEL WITH GFR
ALT: 26 U/L (ref 0–44)
AST: 36 U/L (ref 15–41)
Albumin: 3.6 g/dL (ref 3.5–5.0)
Alkaline Phosphatase: 92 U/L (ref 38–126)
Anion gap: 7 (ref 5–15)
BUN: 11 mg/dL (ref 6–20)
CO2: 28 mmol/L (ref 22–32)
Calcium: 9 mg/dL (ref 8.9–10.3)
Chloride: 106 mmol/L (ref 98–111)
Creatinine, Ser: 0.9 mg/dL (ref 0.61–1.24)
GFR, Estimated: >60 mL/min (ref >60)
Glucose, Bld: 102 mg/dL — ABNORMAL HIGH (ref 70–99)
Potassium: 2.9 mmol/L — ABNORMAL LOW (ref 3.5–5.1)
Sodium: 141 mmol/L (ref 135–145)
Total Bilirubin: 1.3 mg/dL — ABNORMAL HIGH (ref 0.0–1.2)
Total Protein: 7.3 g/dL (ref 6.5–8.1)

## 2024-03-20 LAB — APTT: aPTT: 33 s (ref 24–36)

## 2024-03-20 LAB — VITAMIN D 25 HYDROXY (VIT D DEFICIENCY, FRACTURES): Vit D, 25-Hydroxy: 46.61 ng/mL (ref 30–100)

## 2024-03-20 SURGERY — OPEN REDUCTION INTERNAL FIXATION (ORIF) TIBIAL PLATEAU
Anesthesia: General | Site: Leg Lower | Laterality: Left

## 2024-03-20 MED ORDER — HYDROMORPHONE HCL 1 MG/ML IJ SOLN
INTRAMUSCULAR | Status: DC | PRN
  Administered 2024-03-20 (×2): .5 mg via INTRAVENOUS

## 2024-03-20 MED ORDER — PROPOFOL 10 MG/ML IV BOLUS
INTRAVENOUS | Status: AC
  Filled 2024-03-20: qty 20

## 2024-03-20 MED ORDER — AMISULPRIDE (ANTIEMETIC) 5 MG/2ML IV SOLN: 10.0000 mg | Freq: Once | INTRAVENOUS | Status: DC | PRN

## 2024-03-20 MED ORDER — FENTANYL CITRATE (PF) 250 MCG/5ML IJ SOLN
INTRAMUSCULAR | Status: DC | PRN
Start: 2024-03-20 — End: 2024-03-20
  Administered 2024-03-20 (×2): 100 ug via INTRAVENOUS
  Administered 2024-03-20: 50 ug via INTRAVENOUS

## 2024-03-20 MED ORDER — METHOCARBAMOL 500 MG PO TABS
500.0000 mg | ORAL_TABLET | Freq: Four times a day (QID) | ORAL | 0 refills | Status: AC | PRN
  Filled 2024-03-20: qty 60, 8d supply, fill #0

## 2024-03-20 MED ORDER — MIDAZOLAM HCL 2 MG/2ML IJ SOLN
INTRAMUSCULAR | Status: AC
  Filled 2024-03-20: qty 2

## 2024-03-20 MED ORDER — LACTATED RINGERS IV SOLN: INTRAVENOUS | Status: DC

## 2024-03-20 MED ORDER — ROCURONIUM BROMIDE 10 MG/ML (PF) SYRINGE
PREFILLED_SYRINGE | INTRAVENOUS | Status: AC
  Filled 2024-03-20: qty 10

## 2024-03-20 MED ORDER — ONDANSETRON HCL 4 MG/2ML IJ SOLN: 4.0000 mg | Freq: Once | INTRAMUSCULAR | Status: DC | PRN

## 2024-03-20 MED ORDER — SUGAMMADEX SODIUM 200 MG/2ML IV SOLN
INTRAVENOUS | Status: DC | PRN
  Administered 2024-03-20: 200 mg via INTRAVENOUS

## 2024-03-20 MED ORDER — LIDOCAINE 2% (20 MG/ML) 5 ML SYRINGE
INTRAMUSCULAR | Status: AC
  Filled 2024-03-20: qty 5

## 2024-03-20 MED ORDER — TRANEXAMIC ACID-NACL 1000-0.7 MG/100ML-% IV SOLN
1000.0000 mg | INTRAVENOUS | Status: AC
  Administered 2024-03-20: 1000 mg via INTRAVENOUS
  Filled 2024-03-20: qty 100

## 2024-03-20 MED ORDER — MIDAZOLAM HCL 2 MG/2ML IJ SOLN
INTRAMUSCULAR | Status: DC | PRN
  Administered 2024-03-20: 2 mg via INTRAVENOUS

## 2024-03-20 MED ORDER — KETAMINE HCL 10 MG/ML IJ SOLN
INTRAMUSCULAR | Status: DC | PRN
  Administered 2024-03-20: 50 mg via INTRAVENOUS

## 2024-03-20 MED ORDER — ROCURONIUM BROMIDE 10 MG/ML (PF) SYRINGE
PREFILLED_SYRINGE | INTRAVENOUS | Status: DC | PRN
  Administered 2024-03-20: 30 mg via INTRAVENOUS
  Administered 2024-03-20: 60 mg via INTRAVENOUS

## 2024-03-20 MED ORDER — FENTANYL CITRATE (PF) 100 MCG/2ML IJ SOLN
25.0000 ug | INTRAMUSCULAR | Status: DC | PRN
  Administered 2024-03-20 (×3): 50 ug via INTRAVENOUS

## 2024-03-20 MED ORDER — KETAMINE HCL 50 MG/5ML IJ SOSY
PREFILLED_SYRINGE | INTRAMUSCULAR | Status: AC
  Filled 2024-03-20: qty 5

## 2024-03-20 MED ORDER — FENTANYL CITRATE (PF) 250 MCG/5ML IJ SOLN
INTRAMUSCULAR | Status: AC
  Filled 2024-03-20: qty 5

## 2024-03-20 MED ORDER — APIXABAN 2.5 MG PO TABS
2.5000 mg | ORAL_TABLET | Freq: Two times a day (BID) | ORAL | 0 refills | Status: AC
  Filled 2024-03-20: qty 60, 30d supply, fill #0

## 2024-03-20 MED ORDER — BUPIVACAINE-MELOXICAM ER 400-12 MG/14ML IJ SOLN
INTRAMUSCULAR | Status: AC
  Filled 2024-03-20: qty 1

## 2024-03-20 MED ORDER — LIDOCAINE 2% (20 MG/ML) 5 ML SYRINGE
INTRAMUSCULAR | Status: DC | PRN
  Administered 2024-03-20: 100 mg via INTRAVENOUS

## 2024-03-20 MED ORDER — FENTANYL CITRATE (PF) 100 MCG/2ML IJ SOLN
INTRAMUSCULAR | Status: AC
Start: 2024-03-20 — End: 2024-03-20
  Filled 2024-03-20: qty 2

## 2024-03-20 MED ORDER — OXYCODONE HCL 5 MG PO TABS
5.0000 mg | ORAL_TABLET | Freq: Four times a day (QID) | ORAL | 0 refills | Status: AC | PRN
  Filled 2024-03-20: qty 50, 7d supply, fill #0

## 2024-03-20 MED ORDER — PROPOFOL 10 MG/ML IV BOLUS
INTRAVENOUS | Status: DC | PRN
  Administered 2024-03-20: 180 mg via INTRAVENOUS

## 2024-03-20 MED ORDER — BUPIVACAINE-MELOXICAM ER 400-12 MG/14ML IJ SOLN
INTRAMUSCULAR | Status: DC | PRN
  Administered 2024-03-20: 14 mL

## 2024-03-20 MED ORDER — ORAL CARE MOUTH RINSE: 15.0000 mL | Freq: Once | OROMUCOSAL | Status: AC

## 2024-03-20 MED ORDER — ONDANSETRON HCL 4 MG/2ML IJ SOLN
INTRAMUSCULAR | Status: DC | PRN
  Administered 2024-03-20: 4 mg via INTRAVENOUS

## 2024-03-20 MED ORDER — CHLORHEXIDINE GLUCONATE 0.12 % MT SOLN
15.0000 mL | Freq: Once | OROMUCOSAL | Status: AC
  Administered 2024-03-20: 15 mL via OROMUCOSAL
  Filled 2024-03-20: qty 15

## 2024-03-20 MED ORDER — ONDANSETRON HCL 4 MG/2ML IJ SOLN
INTRAMUSCULAR | Status: AC
  Filled 2024-03-20: qty 2

## 2024-03-20 MED ORDER — DEXMEDETOMIDINE HCL IN NACL 80 MCG/20ML IV SOLN
INTRAVENOUS | Status: DC | PRN
  Administered 2024-03-20 (×5): 8 ug via INTRAVENOUS

## 2024-03-20 MED ORDER — KETOROLAC TROMETHAMINE 10 MG PO TABS
10.0000 mg | ORAL_TABLET | Freq: Four times a day (QID) | ORAL | 0 refills | Status: AC | PRN
  Filled 2024-03-20: qty 20, 5d supply, fill #0

## 2024-03-20 MED ORDER — 0.9 % SODIUM CHLORIDE (POUR BTL) OPTIME
TOPICAL | Status: DC | PRN
  Administered 2024-03-20: 1000 mL

## 2024-03-20 MED ORDER — CEFAZOLIN SODIUM-DEXTROSE 2-4 GM/100ML-% IV SOLN
2.0000 g | INTRAVENOUS | Status: AC
  Administered 2024-03-20: 2 g via INTRAVENOUS
  Filled 2024-03-20: qty 100

## 2024-03-20 MED ORDER — CHLORHEXIDINE GLUCONATE 4 % EX SOLN: 60.0000 mL | Freq: Once | CUTANEOUS | Status: DC

## 2024-03-20 MED ORDER — DEXAMETHASONE SODIUM PHOSPHATE 10 MG/ML IJ SOLN
INTRAMUSCULAR | Status: AC
  Filled 2024-03-20: qty 1

## 2024-03-20 MED ORDER — ONDANSETRON 4 MG PO TBDP
4.0000 mg | ORAL_TABLET | Freq: Three times a day (TID) | ORAL | 0 refills | Status: AC | PRN
  Filled 2024-03-20: qty 20, 7d supply, fill #0

## 2024-03-20 MED ORDER — DEXAMETHASONE SODIUM PHOSPHATE 10 MG/ML IJ SOLN
INTRAMUSCULAR | Status: DC | PRN
  Administered 2024-03-20: 10 mg via INTRAVENOUS

## 2024-03-20 MED ORDER — EPHEDRINE SULFATE-NACL 50-0.9 MG/10ML-% IV SOSY
PREFILLED_SYRINGE | INTRAVENOUS | Status: DC | PRN
  Administered 2024-03-20: 5 mg via INTRAVENOUS

## 2024-03-20 MED ORDER — POVIDONE-IODINE 10 % EX SWAB
2.0000 | Freq: Once | CUTANEOUS | Status: AC
  Administered 2024-03-20: 2 via TOPICAL

## 2024-03-20 SURGICAL SUPPLY — 73 items
BAG COUNTER SPONGE SURGICOUNT (BAG) ×2 IMPLANT
BANDAGE ESMARK 6X9 LF (GAUZE/BANDAGES/DRESSINGS) ×2 IMPLANT
BIT DRILL 2.5X2.75 QC CALB (BIT) IMPLANT
BIT DRILL CAL (BIT) IMPLANT
BLADE CLIPPER SURG (BLADE) IMPLANT
BLADE SURG 10 STRL SS (BLADE) ×2 IMPLANT
BLADE SURG 15 STRL LF DISP TIS (BLADE) ×2 IMPLANT
BNDG COHESIVE 4X5 TAN STRL LF (GAUZE/BANDAGES/DRESSINGS) ×2 IMPLANT
BNDG ELASTIC 4INX 5YD STR LF (GAUZE/BANDAGES/DRESSINGS) IMPLANT
BNDG ELASTIC 4X5.8 VLCR STR LF (GAUZE/BANDAGES/DRESSINGS) ×2 IMPLANT
BNDG ELASTIC 6INX 5YD STR LF (GAUZE/BANDAGES/DRESSINGS) ×2 IMPLANT
BNDG GAUZE DERMACEA FLUFF 4 (GAUZE/BANDAGES/DRESSINGS) ×2 IMPLANT
BRUSH SCRUB EZ PLAIN DRY (MISCELLANEOUS) ×4 IMPLANT
CANISTER SUCTION 3000ML PPV (SUCTIONS) ×2 IMPLANT
CHIPS CANC BONE 40CC CAN1/2 (Bone Implant) ×1 IMPLANT
COVER MAYO STAND STRL (DRAPES) ×2 IMPLANT
COVER SURGICAL LIGHT HANDLE (MISCELLANEOUS) ×2 IMPLANT
CUFF TRNQT CYL 34X4.125X (TOURNIQUET CUFF) ×2 IMPLANT
DRAPE C-ARM 42X72 X-RAY (DRAPES) ×2 IMPLANT
DRAPE C-ARMOR (DRAPES) ×2 IMPLANT
DRAPE HALF SHEET 40X57 (DRAPES) ×4 IMPLANT
DRAPE INCISE IOBAN 66X45 STRL (DRAPES) ×2 IMPLANT
DRAPE U-SHAPE 47X51 STRL (DRAPES) ×2 IMPLANT
DRSG ADAPTIC 3X8 NADH LF (GAUZE/BANDAGES/DRESSINGS) ×2 IMPLANT
ELECTRODE REM PT RTRN 9FT ADLT (ELECTROSURGICAL) ×2 IMPLANT
GAUZE PAD ABD 8X10 STRL (GAUZE/BANDAGES/DRESSINGS) ×8 IMPLANT
GAUZE SPONGE 4X4 12PLY STRL (GAUZE/BANDAGES/DRESSINGS) ×2 IMPLANT
GLOVE BIO SURGEON STRL SZ7.5 (GLOVE) ×2 IMPLANT
GLOVE BIO SURGEON STRL SZ8 (GLOVE) ×2 IMPLANT
GLOVE BIOGEL PI IND STRL 7.5 (GLOVE) ×2 IMPLANT
GLOVE BIOGEL PI IND STRL 8 (GLOVE) ×2 IMPLANT
GLOVE SURG ORTHO LTX SZ7.5 (GLOVE) ×4 IMPLANT
GLOVE XGUARD RR 2 7.5 (GLOVE) ×2 IMPLANT
GOWN STRL REUS W/ TWL LRG LVL3 (GOWN DISPOSABLE) ×4 IMPLANT
GOWN STRL REUS W/ TWL XL LVL3 (GOWN DISPOSABLE) ×2 IMPLANT
GRAFT BNE CHIP CANC 1-8 40 (Bone Implant) IMPLANT
IMMOBILIZER KNEE 22 UNIV (SOFTGOODS) ×2 IMPLANT
KIT BASIN OR (CUSTOM PROCEDURE TRAY) ×2 IMPLANT
KIT TURNOVER KIT B (KITS) ×2 IMPLANT
KWIRE ACE 1.6X6 (WIRE) IMPLANT
MANIFOLD NEPTUNE II (INSTRUMENTS) ×2 IMPLANT
NDL SUT 6 .5 CRC .975X.05 MAYO (NEEDLE) IMPLANT
NS IRRIG 1000ML POUR BTL (IV SOLUTION) ×2 IMPLANT
PACK ORTHO EXTREMITY (CUSTOM PROCEDURE TRAY) ×2 IMPLANT
PAD ARMBOARD POSITIONER FOAM (MISCELLANEOUS) ×4 IMPLANT
PAD CAST 4YDX4 CTTN HI CHSV (CAST SUPPLIES) ×2 IMPLANT
PADDING CAST COTTON 6X4 STRL (CAST SUPPLIES) ×2 IMPLANT
PLATE LOCK 5H STD LT PROX TIB (Plate) IMPLANT
SCREW CORTICAL 3.5MM 36MM (Screw) IMPLANT
SCREW CORTICAL 3.5MM 40MM (Screw) IMPLANT
SCREW CORTICAL 3.5MM 44MM (Screw) IMPLANT
SCREW LOCK 3.5X70 DIST TIB (Screw) IMPLANT
SCREW LOCK 3.5X80 DIST TIB (Screw) IMPLANT
SCREW LOCK 3.5X85 DIST TIB (Screw) IMPLANT
SCREW LOCK CORT STAR 3.5X65 (Screw) IMPLANT
SCREW LOCK CORT STAR 3.5X70 (Screw) IMPLANT
SCREW LOCK CORT STAR 3.5X80 (Screw) IMPLANT
SCREW LOW PROF CORTICAL 3.5X80 (Screw) IMPLANT
SPONGE T-LAP 18X18 ~~LOC~~+RFID (SPONGE) ×2 IMPLANT
STAPLER SKIN PROX 35W (STAPLE) ×2 IMPLANT
STOCKINETTE IMPERVIOUS LG (DRAPES) ×2 IMPLANT
SUCTION TUBE FRAZIER 10FR DISP (SUCTIONS) ×2 IMPLANT
SUT ETHILON 2 0 FS 18 (SUTURE) IMPLANT
SUT PROLENE 0 CT 2 (SUTURE) ×4 IMPLANT
SUT VIC AB 0 CT1 27XBRD ANBCTR (SUTURE) ×2 IMPLANT
SUT VIC AB 1 CT1 27XBRD ANBCTR (SUTURE) ×2 IMPLANT
SUT VIC AB 2-0 CT1 TAPERPNT 27 (SUTURE) ×4 IMPLANT
TOWEL GREEN STERILE (TOWEL DISPOSABLE) ×4 IMPLANT
TOWEL GREEN STERILE FF (TOWEL DISPOSABLE) ×2 IMPLANT
TRAY FOLEY MTR SLVR 16FR STAT (SET/KITS/TRAYS/PACK) IMPLANT
TUBE CONNECTING 12X1/4 (SUCTIONS) ×2 IMPLANT
WATER STERILE IRR 1000ML POUR (IV SOLUTION) ×4 IMPLANT
YANKAUER SUCT BULB TIP NO VENT (SUCTIONS) ×2 IMPLANT

## 2024-03-20 NOTE — Transfer of Care (Signed)
 Immediate Anesthesia Transfer of Care Note  Patient: [place] R Jachim  Procedure(s) Performed: OPEN REDUCTION INTERNAL FIXATION (ORIF) TIBIAL PLATEAU (Left: Leg Lower) OPEN REDUCTION INTERNAL FIXATION (ORIF) TIBIA FRACTURE (Left: Leg Lower)  Patient Location: PACU  Anesthesia Type:General  Level of Consciousness: awake and alert   Airway & Oxygen Therapy: Patient Spontanous Breathing and Patient connected to face mask oxygen  Post-op Assessment: Report given to RN and Post -op Vital signs reviewed and stable  Post vital signs: Reviewed and stable  Last Vitals:  Vitals Value Taken Time  BP 119/75 03/20/24 10:40  Temp    Pulse 98 03/20/24 10:42  Resp 13 03/20/24 10:42  SpO2 96 % 03/20/24 10:42  Vitals shown include unfiled device data.  Last Pain:  Vitals:   03/20/24 0603  TempSrc:   PainSc: 0-No pain      Patients Stated Pain Goal: 0 (03/20/24 0603)  Complications: No notable events documented.
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
## 2024-03-20 NOTE — Progress Notes (Signed)
 Dr. Corinne made aware of today's CMP result- Potassium 2.9. Ok per Dr. Corinne. No new orders received.
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
## 2024-03-20 NOTE — Progress Notes (Signed)
 Orthopedic Tech Progress Note Patient Details:  Jimmy Gonzales May 06, 1971 [id number]  PACU RN called requesting a ROM BRACER,. I told her we don't apply, but I did deliver it to bedside   Patient ID: Jimmy Gonzales, male   DOB: [birth year], 53 y.o.   MRN: [id number]  [name] Pac 03/20/2024, 11:22 AM

## 2024-03-20 NOTE — Progress Notes (Signed)
 Physical Therapy Quick Note  PT has completed initial evaluation.    Overall, patient at supervision assistance level.   PT Follow up recommended: Outpatient PT (the patient may benefit from outpatient PT after follow-up with orthopedics, no imminent outpatient PT needs). Equipment recommended:  Rolling walker Complete evaluation note to follow.     Bernardino JINNY Ruth, PT, DPT Acute Rehabilitation Office [phone]

## 2024-03-20 NOTE — Anesthesia Procedure Notes (Signed)
 Procedure Name: Intubation Date/Time: 03/20/2024 8:23 AM  Performed by: Boyce Shilling, CRNAPre-anesthesia Checklist: Patient identified, Emergency Drugs available, Suction available, Timeout performed and Patient being monitored Patient Re-evaluated:Patient Re-evaluated prior to induction Oxygen Delivery Method: Circle system utilized Preoxygenation: Pre-oxygenation with 100% oxygen Induction Type: IV induction Ventilation: Mask ventilation without difficulty Laryngoscope Size: Mac and 4 Grade View: Grade I Tube type: Oral Tube size: 7.5 mm Number of attempts: 1 Airway Equipment and Method: Stylet Placement Confirmation: ETT inserted through vocal cords under direct vision, positive ETCO2, CO2 detector and breath sounds checked- equal and bilateral Secured at: 23 cm Tube secured with: Tape Dental Injury: Teeth and Oropharynx as per pre-operative assessment

## 2024-03-20 NOTE — Progress Notes (Signed)
 50mcg of fentanyl  wasted and witnessed by Ozell Maizes, RN.

## 2024-03-20 NOTE — Discharge Planning (Signed)
 Jimmy Gonzales, BSN, RN, UTAH [phone] Pt qualifies for DME [facility]) rolling walker.  DME  ordered through [employer].  Ryan of [employer] notified to deliver DME to pt room prior to D/C home.

## 2024-03-20 NOTE — Anesthesia Postprocedure Evaluation (Signed)
 Anesthesia Post Note  Patient: Jimmy Gonzales  Procedure(s) Performed: OPEN REDUCTION INTERNAL FIXATION (ORIF) TIBIAL PLATEAU (Left: Leg Lower) OPEN REDUCTION INTERNAL FIXATION (ORIF) TIBIA FRACTURE (Left: Leg Lower)     Patient location during evaluation: PACU Anesthesia Type: General Level of consciousness: awake and alert Pain management: pain level controlled Vital Signs Assessment: post-procedure vital signs reviewed and stable Respiratory status: spontaneous breathing, nonlabored ventilation and respiratory function stable Cardiovascular status: blood pressure returned to baseline and stable Postop Assessment: no apparent nausea or vomiting Anesthetic complications: no   No notable events documented.  Last Vitals:  Vitals:   03/20/24 1115 03/20/24 1130  BP: 139/81 (!) 147/84  Pulse: 81 83  Resp: (!) 8 15  Temp:  36.7 C  SpO2: 97% 96%    Last Pain:  Vitals:   03/20/24 1130  TempSrc:   PainSc: 3                  Garnette FORBES Skillern

## 2024-03-20 NOTE — Discharge Instructions (Addendum)
 Orthopaedic Trauma Service Discharge Instructions   General Discharge Instructions  Orthopaedic Injuries:  Left tibial plateau fracture treated with open reduction internal fixation using plate and screws  WEIGHT BEARING STATUS: Nonweightbearing left leg.  Use walker or crutches  RANGE OF MOTION/ACTIVITY: Unrestricted range of motion of left knee.  Do not let knee rest in flexion.  Do home exercises that therapy is taught to 2-3 times a day.  Ice after therapy sessions  Bone health:   Review the following resource for additional information regarding bone health  [email or web address]  Wound Care: Daily wound care starting on 03/22/2024.  See below Discharge Wound Care Instructions  Do NOT apply any ointments, solutions or lotions to pin sites or surgical wounds.  These prevent needed drainage and even though solutions like hydrogen peroxide kill bacteria, they also damage cells lining the pin sites that help fight infection.  Applying lotions or ointments can keep the wounds moist and can cause them to breakdown and open up as well. This can increase the risk for infection. When in doubt call the office.  Surgical incisions should be dressed daily.  If any drainage is noted, use one layer of adaptic or Mepitel, then gauze, Kerlix, and an ace wrap.  [email or web address] [email or web address]_[email or web address]  [email or web address]  These dressing supplies should be available at local medical supply stores ([facility] [employer], [facility], etc). They are not usually carried at places like [facility], [facility], [facility], etc  Once the incision is completely dry and without drainage, it may be left open to air out.  Showering may begin 36-48 hours later.  Cleaning gently with  soap and water .  Traumatic wounds should be dressed daily as well.    One layer of adaptic, gauze, Kerlix, then ace wrap.  The adaptic can be discontinued once the draining has ceased    If you have a wet to dry dressing: wet the gauze with saline the squeeze as much saline out so the gauze is moist (not soaking wet), place moistened gauze over wound, then place a dry gauze over the moist one, followed by Kerlix wrap, then ace wrap.  DVT/PE prophylaxis: Eliquis 2.5 mg every 12 hours for 30 days for blood clot prevention  Diet: as you were eating previously.  Can use over the counter stool softeners and bowel preparations, such as Miralax , to help with bowel movements.  Narcotics can be constipating.  Be sure to drink plenty of fluids  PAIN MEDICATION USE AND EXPECTATIONS  You have likely been given narcotic medications to help control your pain.  After a traumatic event that results in an fracture (broken bone) with or without surgery, it is ok to use narcotic pain medications to help control one's pain.  We understand that everyone responds to pain differently and each individual patient will be evaluated on a regular basis for the continued need for narcotic medications. Ideally, narcotic medication use should last no more than 6-8 weeks (coinciding with fracture healing).   As a patient it is your responsibility as well to monitor narcotic medication use and report the amount and frequency you use these medications when you come to your office visit.   We would also advise that if you are using narcotic medications, you should take a dose prior to therapy to maximize you participation.  IF YOU ARE ON NARCOTIC MEDICATIONS IT IS NOT PERMISSIBLE TO OPERATE A MOTOR VEHICLE (MOTORCYCLE/CAR/TRUCK/MOPED) OR HEAVY MACHINERY DO NOT MIX NARCOTICS WITH OTHER CNS (CENTRAL NERVOUS SYSTEM) DEPRESSANTS SUCH AS ALCOHOL  POST-OPERATIVE OPIOID TAPER INSTRUCTIONS: It is important to wean off of your opioid  medication as soon as possible. If you do not need pain medication after your surgery it is ok to stop day one. Opioids include: Codeine, Hydrocodone(Norco, Vicodin), Oxycodone (Percocet, oxycontin ) and hydromorphone amongst others.  Long term and even short term use of opiods can cause: Increased pain response Dependence Constipation Depression Respiratory depression And more.  Withdrawal symptoms can include Flu like symptoms Nausea, vomiting And more Techniques to manage these symptoms Hydrate well Eat regular healthy meals Stay active Use relaxation techniques(deep breathing, meditating, yoga) Do Not substitute Alcohol to help with tapering If you have been on opioids for less than two weeks and do not have pain than it is ok to stop all together.  Plan to wean off of opioids This plan should start within one week post op of your fracture surgery  Maintain the same interval or time between taking each dose and first decrease the dose.  Cut the total daily intake of opioids by one tablet each day Next start to increase the time between doses. The last dose that should be eliminated is the evening dose.    STOP SMOKING OR USING NICOTINE PRODUCTS!!!!  As discussed nicotine severely impairs your body's ability to heal surgical and traumatic wounds but also impairs bone healing.  Wounds and bone heal by forming microscopic blood vessels (angiogenesis) and nicotine is a vasoconstrictor (essentially, shrinks blood vessels).  Therefore, if vasoconstriction occurs to these microscopic blood vessels they essentially disappear and are unable to deliver necessary nutrients to the healing tissue.  This is one modifiable factor that you can do to dramatically increase your chances of healing your injury.    (This means no smoking, no nicotine gum, patches, etc)  DO NOT USE NONSTEROIDAL ANTI-INFLAMMATORY DRUGS (NSAID'S)  Using products such as Advil (ibuprofen), Aleve (naproxen), Motrin  (ibuprofen) for additional pain control during fracture healing can delay and/or prevent the healing response.  If you would like to take over the counter (OTC) medication, Tylenol  (acetaminophen ) is ok.  However, some narcotic medications that are given for pain control contain acetaminophen  as well. Therefore, you should not exceed more than 4000 mg of tylenol  in a day if you do not have liver disease.  Also note that there are may OTC medicines, such as cold medicines and allergy medicines that my contain tylenol  as well.  If you have any questions about medications and/or interactions please ask your doctor/PA or your pharmacist.      ICE AND ELEVATE INJURED/OPERATIVE EXTREMITY  Using ice and elevating the injured extremity above your heart can help with swelling and pain control.  Icing in a pulsatile fashion, such as 20 minutes on and 20 minutes off, can be followed.    Do not place ice directly on skin. Make sure there is a barrier between to skin and the ice pack.    Using frozen items such as frozen peas works well as the conform nicely to the are that needs to be iced.  USE AN ACE WRAP OR TED HOSE FOR SWELLING CONTROL  In addition to icing and elevation, Ace wraps or TED hose are used to help limit and resolve swelling.  It is recommended to use Ace wraps or TED hose until you are informed to stop.    When using Ace Wraps start the wrapping distally (farthest away from the body) and wrap proximally (closer to the body)   Example: If you had surgery  on your leg and you do not have a splint on, start the ace wrap at the toes and work your way up to the thigh        If you had surgery on your upper extremity and do not have a splint on, start the ace wrap at your fingers and work your way up to the upper arm  IF YOU ARE IN A SPLINT OR CAST DO NOT REMOVE IT FOR ANY REASON   If your splint gets wet for any reason please contact the office immediately. You may shower in your splint or cast as long  as you keep it dry.  This can be done by wrapping in a cast cover or garbage back (or similar)  Do Not stick any thing down your splint or cast such as pencils, money, or hangers to try and scratch yourself with.  If you feel itchy take benadryl  as prescribed on the bottle for itching  IF YOU ARE IN A CAM BOOT (BLACK BOOT)  You may remove boot periodically. Perform daily dressing changes as noted below.  Wash the liner of the boot regularly and wear a sock when wearing the boot. It is recommended that you sleep in the boot until told otherwise    Call office for the following: Temperature greater than 101F Persistent nausea and vomiting Severe uncontrolled pain Redness, tenderness, or signs of infection (pain, swelling, redness, odor or green/yellow discharge around the site) Difficulty breathing, headache or visual disturbances Hives Persistent dizziness or light-headedness Extreme fatigue Any other questions or concerns you may have after discharge  In an emergency, call 911 or go to an Emergency Department at a nearby hospital  HELPFUL INFORMATION  If you had a block, it will wear off between 8-24 hrs postop typically.  This is period when your pain may go from nearly zero to the pain you would have had postop without the block.  This is an abrupt transition but nothing dangerous is happening.  You may take an extra dose of narcotic when this happens.  You should wean off your narcotic medicines as soon as you are able.  Most patients will be off or using minimal narcotics before their first postop appointment.   We suggest you use the pain medication the first night prior to going to bed, in order to ease any pain when the anesthesia wears off. You should avoid taking pain medications on an empty stomach as it will make you nauseous.  Do not drink alcoholic beverages or take illicit drugs when taking pain medications.  In most states it is against the law to drive while you are in  a splint or sling.  And certainly against the law to drive while taking narcotics.  You may return to work/school in the next couple of days when you feel up to it.   Pain medication may make you constipated.  Below are a few solutions to try in this order: Decrease the amount of pain medication if you aren't having pain. Drink lots of decaffeinated fluids. Drink prune juice and/or each dried prunes  If the first 3 don't work start with additional solutions Take Colace - an over-the-counter stool softener Take Senokot - an over-the-counter laxative Take Miralax  - a stronger over-the-counter laxative     CALL THE OFFICE WITH ANY QUESTIONS OR CONCERNS: [phone]   VISIT OUR WEBSITE FOR ADDITIONAL INFORMATION: [email or web address]

## 2024-03-21 ENCOUNTER — Encounter (HOSPITAL_COMMUNITY): Payer: Self-pay | Admitting: Orthopedic Surgery

## 2024-04-03 IMAGING — US US ART/VEN ABD/PELV/SCROTUM DOPPLER COMPLETE
1 series · 13 of 25 positions shown · non-contrast
Comparison: 01/06/2021

CLINICAL DATA: History of cirrhosis and status post TIPS placement
on 05/16/2020 to treat esophageal variceal bleeding.

EXAM:
DUPLEX ULTRASOUND OF LIVER AND TIPS SHUNT
TECHNIQUE: Color and duplex Doppler ultrasound was performed to evaluate the
hepatic in-flow and out-flow vessels.

[Series 1: us art/ven abd/pelv/scrotum doppler complete · 0.23mm/px · 13 of 113 slices shown]
[im 1/113]
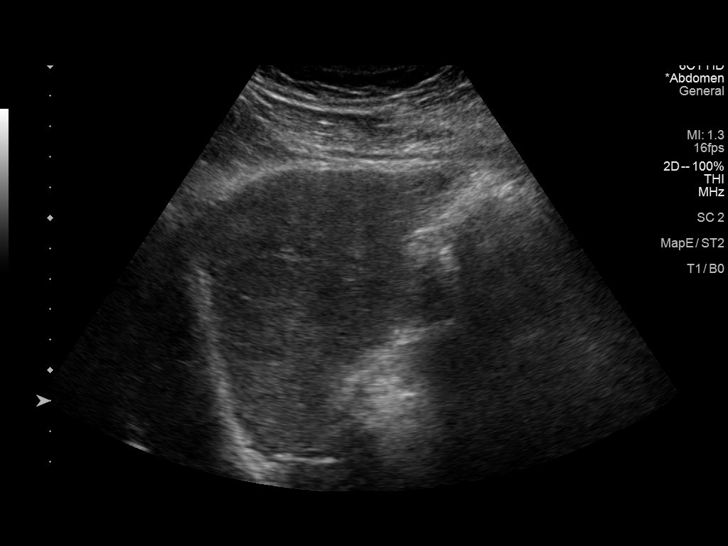
[im 10/113]
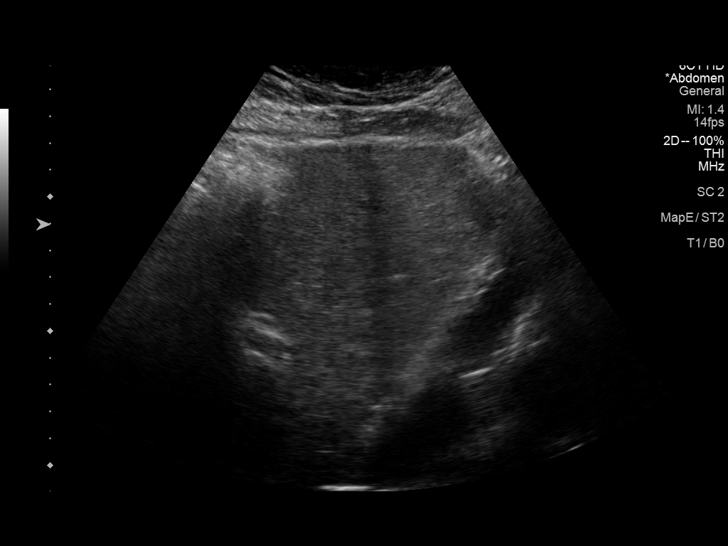
[im 19/113]
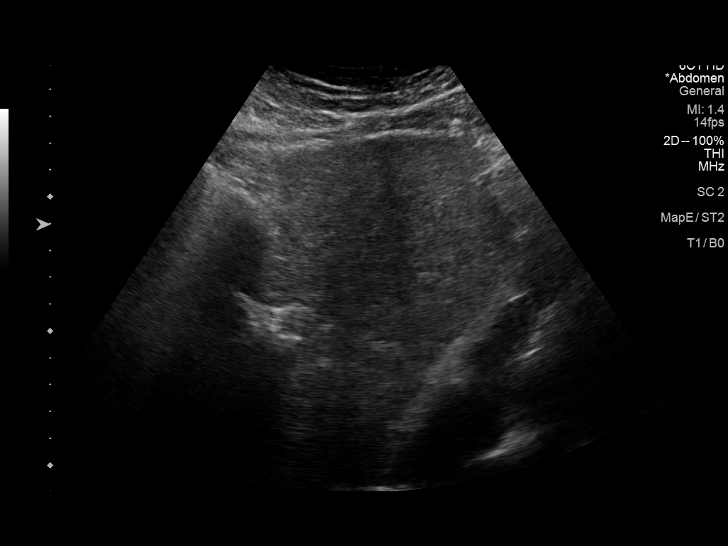
[im 29/113]
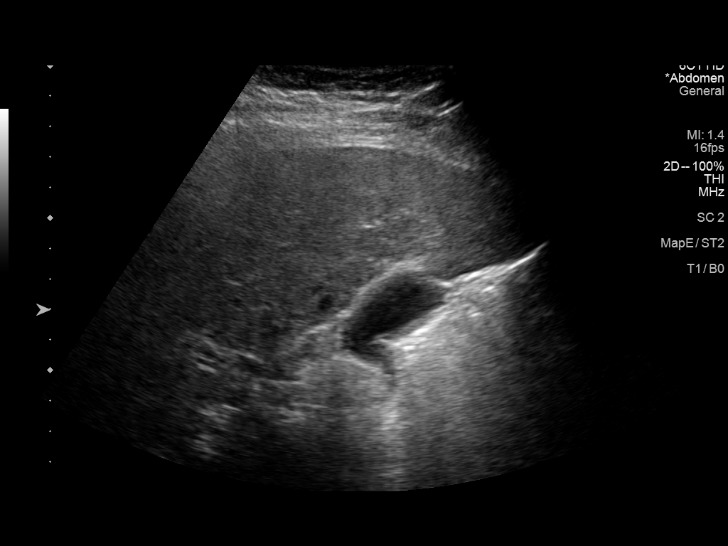
[im 38/113]
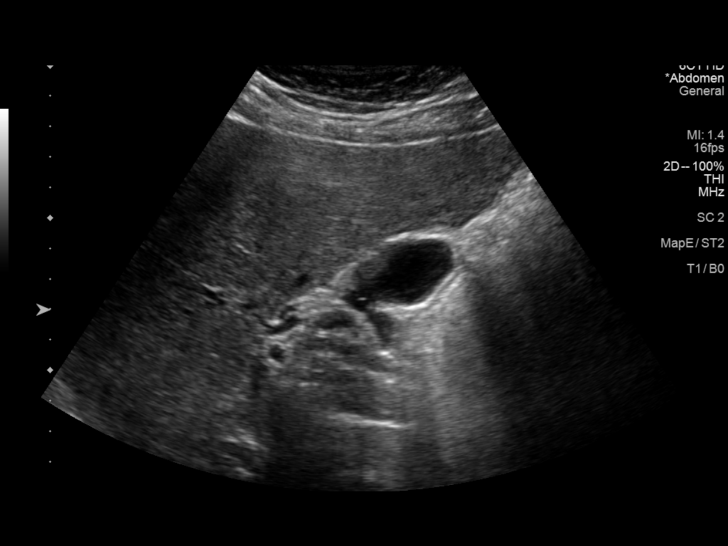
[im 47/113]
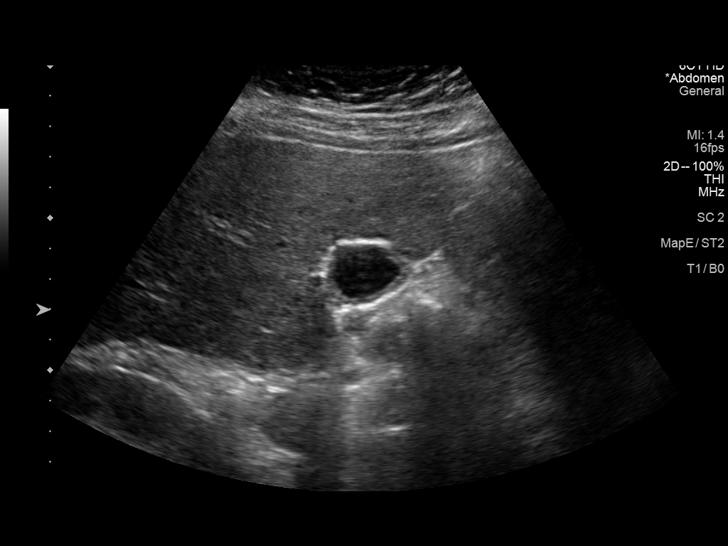
[im 57/113]
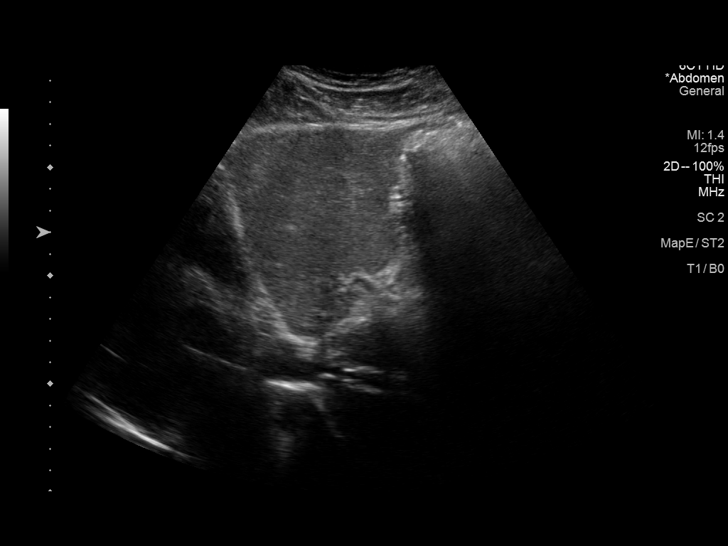
[im 66/113]
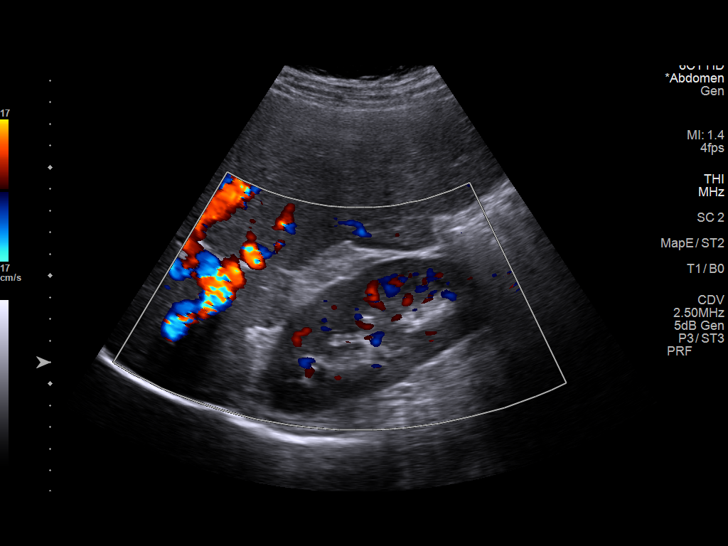
[im 75/113]
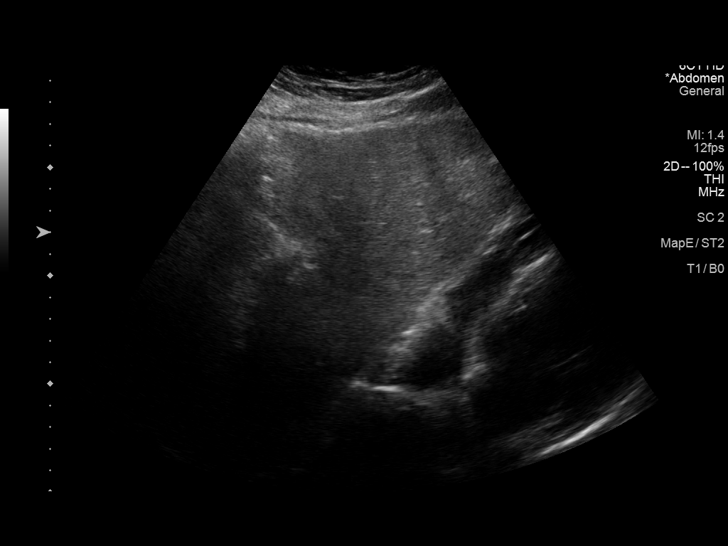
[im 85/113]
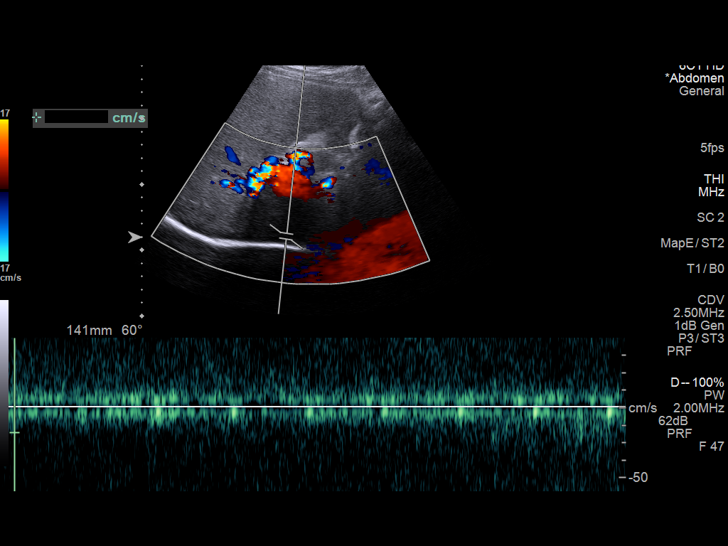
[im 94/113]
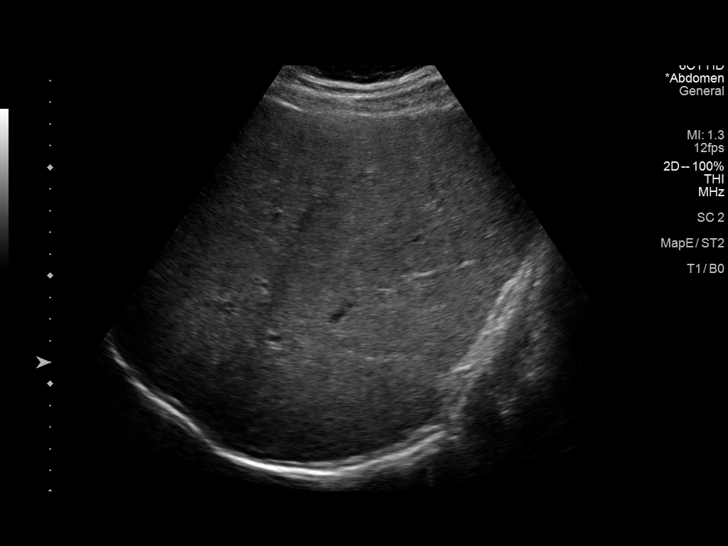
[im 103/113]
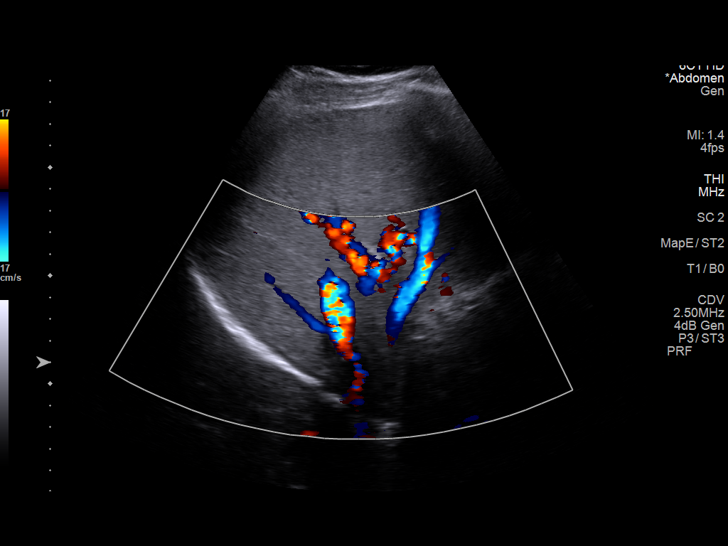
[im 113/113]
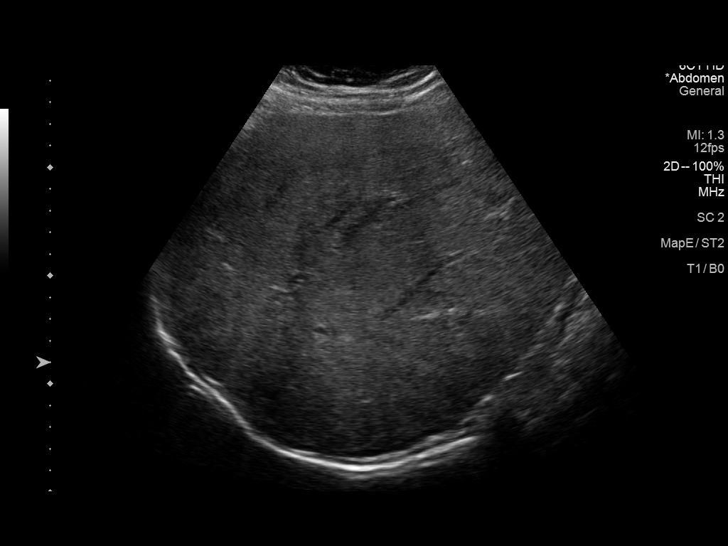

[13 of 25 positions shown; findings below may reference images not displayed]

FINDINGS: Portal Vein Velocities

Main:  44 cm/sec

Right:  18 cm/sec

Left:  18 cm/sec

TIPS Stent Velocities

Proximal:  182 cm/sec

Mid:  174

Distal:  207 cm/sec

IVC: Present and patent with normal respiratory phasicity.

Hepatic Vein Velocities

Right:  18 cm/sec

Mid:  28 cm/sec

Left:  Difficult to visualize.

Splenic Vein: 72 cm/sec

Hepatic Artery: 70 cm/sec

Ascities: None visualized.

Varices: None visualized.

Other findings: The liver again demonstrates evidence underlying
cirrhosis. Nodularity noted without discrete hepatic mass. Area of
slightly decreased echogenicity in the left lobe is ill-defined and
may represent an area of relative fatty sparing. No evidence of
portal vein thrombus. Portal vein flow is towards the liver. The
TIPS shunt demonstrates normal patency and waveforms with no
evidence to suggest stenosis.
IMPRESSION: Widely patent TIPS shunt with normal velocities and waveforms.

## 2024-04-04 DIAGNOSIS — S82122D Displaced fracture of lateral condyle of left tibia, subsequent encounter for closed fracture with routine healing: Secondary | ICD-10-CM | POA: Diagnosis not present

## 2024-04-04 DIAGNOSIS — S82112D Displaced fracture of left tibial spine, subsequent encounter for closed fracture with routine healing: Secondary | ICD-10-CM | POA: Diagnosis not present

## 2024-04-27 ENCOUNTER — Other Ambulatory Visit: Payer: Self-pay | Admitting: Interventional Radiology

## 2024-04-27 DIAGNOSIS — Z8719 Personal history of other diseases of the digestive system: Secondary | ICD-10-CM

## 2024-05-02 DIAGNOSIS — S82122D Displaced fracture of lateral condyle of left tibia, subsequent encounter for closed fracture with routine healing: Secondary | ICD-10-CM | POA: Diagnosis not present

## 2024-05-02 DIAGNOSIS — S82142D Displaced bicondylar fracture of left tibia, subsequent encounter for closed fracture with routine healing: Secondary | ICD-10-CM | POA: Diagnosis not present

## 2024-05-02 DIAGNOSIS — S82112D Displaced fracture of left tibial spine, subsequent encounter for closed fracture with routine healing: Secondary | ICD-10-CM | POA: Diagnosis not present

## 2024-05-16 ENCOUNTER — Ambulatory Visit
Admission: RE | Admit: 2024-05-16 | Discharge: 2024-05-16 | Disposition: A | Discharge status: Home / Self Care | Source: Ambulatory Visit | Attending: Interventional Radiology | Admitting: Interventional Radiology

## 2024-05-16 DIAGNOSIS — I8501 Esophageal varices with bleeding: Secondary | ICD-10-CM | POA: Diagnosis not present

## 2024-05-16 DIAGNOSIS — Z8719 Personal history of other diseases of the digestive system: Secondary | ICD-10-CM

## 2024-05-18 ENCOUNTER — Ambulatory Visit
Admission: RE | Admit: 2024-05-18 | Discharge: 2024-05-18 | Disposition: A | Discharge status: Home / Self Care | Source: Ambulatory Visit | Attending: Interventional Radiology | Admitting: Interventional Radiology

## 2024-05-18 DIAGNOSIS — Z8719 Personal history of other diseases of the digestive system: Secondary | ICD-10-CM

## 2024-05-18 DIAGNOSIS — Z9889 Other specified postprocedural states: Secondary | ICD-10-CM | POA: Diagnosis not present

## 2024-05-18 DIAGNOSIS — I8501 Esophageal varices with bleeding: Secondary | ICD-10-CM | POA: Diagnosis not present

## 2024-05-18 HISTORY — PX: IR RADIOLOGIST EVAL & MGMT: IMG5224
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
## 2024-05-23 ENCOUNTER — Ambulatory Visit
Admission: RE | Admit: 2024-05-23 | Discharge: 2024-05-23 | Disposition: A | Discharge status: Home / Self Care | Source: Ambulatory Visit | Attending: Gastroenterology

## 2024-05-23 DIAGNOSIS — K7689 Other specified diseases of liver: Secondary | ICD-10-CM | POA: Diagnosis not present

## 2024-05-23 DIAGNOSIS — K7030 Alcoholic cirrhosis of liver without ascites: Secondary | ICD-10-CM

## 2024-05-29 DIAGNOSIS — K7460 Unspecified cirrhosis of liver: Secondary | ICD-10-CM | POA: Diagnosis not present

## 2024-06-05 DIAGNOSIS — I10 Essential (primary) hypertension: Secondary | ICD-10-CM | POA: Diagnosis not present

## 2024-06-05 DIAGNOSIS — Z125 Encounter for screening for malignant neoplasm of prostate: Secondary | ICD-10-CM | POA: Diagnosis not present

## 2024-06-05 DIAGNOSIS — E039 Hypothyroidism, unspecified: Secondary | ICD-10-CM | POA: Diagnosis not present

## 2024-06-05 DIAGNOSIS — K7030 Alcoholic cirrhosis of liver without ascites: Secondary | ICD-10-CM | POA: Diagnosis not present

## 2024-06-05 DIAGNOSIS — E785 Hyperlipidemia, unspecified: Secondary | ICD-10-CM | POA: Diagnosis not present

## 2024-06-20 DIAGNOSIS — S82112D Displaced fracture of left tibial spine, subsequent encounter for closed fracture with routine healing: Secondary | ICD-10-CM | POA: Diagnosis not present

## 2024-06-20 DIAGNOSIS — S82142D Displaced bicondylar fracture of left tibia, subsequent encounter for closed fracture with routine healing: Secondary | ICD-10-CM | POA: Diagnosis not present

## 2024-06-20 DIAGNOSIS — S82122D Displaced fracture of lateral condyle of left tibia, subsequent encounter for closed fracture with routine healing: Secondary | ICD-10-CM | POA: Diagnosis not present
